# Patient Record
Sex: Male | Born: 1935
Health system: Southern US, Community
[De-identification: ages and names within clinical notes are randomized; demographics above are authoritative.]

## PROBLEM LIST (undated history)

## (undated) DIAGNOSIS — K219 Gastro-esophageal reflux disease without esophagitis: Secondary | ICD-10-CM

## (undated) DIAGNOSIS — Z860101 Personal history of adenomatous and serrated colon polyps: Secondary | ICD-10-CM

## (undated) DIAGNOSIS — Z86010 Personal history of colonic polyps: Secondary | ICD-10-CM

## (undated) DIAGNOSIS — Z8719 Personal history of other diseases of the digestive system: Secondary | ICD-10-CM

## (undated) DIAGNOSIS — E785 Hyperlipidemia, unspecified: Secondary | ICD-10-CM

## (undated) DIAGNOSIS — Z972 Presence of dental prosthetic device (complete) (partial): Secondary | ICD-10-CM

## (undated) DIAGNOSIS — R32 Unspecified urinary incontinence: Secondary | ICD-10-CM

## (undated) DIAGNOSIS — I10 Essential (primary) hypertension: Secondary | ICD-10-CM

## (undated) DIAGNOSIS — T4145XA Adverse effect of unspecified anesthetic, initial encounter: Secondary | ICD-10-CM

## (undated) DIAGNOSIS — J4530 Mild persistent asthma, uncomplicated: Secondary | ICD-10-CM

## (undated) DIAGNOSIS — Z973 Presence of spectacles and contact lenses: Secondary | ICD-10-CM

## (undated) DIAGNOSIS — E039 Hypothyroidism, unspecified: Secondary | ICD-10-CM

## (undated) DIAGNOSIS — M1990 Unspecified osteoarthritis, unspecified site: Secondary | ICD-10-CM

## (undated) DIAGNOSIS — Z86018 Personal history of other benign neoplasm: Secondary | ICD-10-CM

## (undated) DIAGNOSIS — N320 Bladder-neck obstruction: Secondary | ICD-10-CM

## (undated) DIAGNOSIS — G609 Hereditary and idiopathic neuropathy, unspecified: Secondary | ICD-10-CM

## (undated) DIAGNOSIS — R399 Unspecified symptoms and signs involving the genitourinary system: Secondary | ICD-10-CM

## (undated) DIAGNOSIS — N400 Enlarged prostate without lower urinary tract symptoms: Secondary | ICD-10-CM

## (undated) HISTORY — DX: Benign prostatic hyperplasia without lower urinary tract symptoms: N40.0

## (undated) HISTORY — PX: TONSILLECTOMY: SUR1361

## (undated) HISTORY — DX: Essential (primary) hypertension: I10

## (undated) HISTORY — DX: Gilbert syndrome: E80.4

## (undated) HISTORY — PX: TARSAL TUNNEL RELEASE: SUR1099

## (undated) SURGERY — Surgical Case
Anesthesia: *Unknown

---

## 1990-06-18 DIAGNOSIS — T8859XA Other complications of anesthesia, initial encounter: Secondary | ICD-10-CM

## 1990-06-18 HISTORY — DX: Other complications of anesthesia, initial encounter: T88.59XA

## 1990-06-18 HISTORY — PX: GASTRIC RESECTION: SHX5248

## 1997-06-18 HISTORY — PX: TURP VAPORIZATION: SUR1397

## 1997-11-01 ENCOUNTER — Ambulatory Visit (HOSPITAL_COMMUNITY): Admission: RE | Admit: 1997-11-01 | Discharge: 1997-11-01 | Payer: Self-pay | Admitting: Gastroenterology

## 1998-06-18 HISTORY — PX: INGUINAL HERNIA REPAIR: SUR1180

## 1998-06-21 ENCOUNTER — Ambulatory Visit (HOSPITAL_BASED_OUTPATIENT_CLINIC_OR_DEPARTMENT_OTHER): Admission: RE | Admit: 1998-06-21 | Discharge: 1998-06-21 | Payer: Self-pay | Admitting: Surgery

## 2000-08-26 ENCOUNTER — Encounter: Admission: RE | Admit: 2000-08-26 | Discharge: 2000-08-26 | Payer: Self-pay | Admitting: Geriatric Medicine

## 2000-08-26 ENCOUNTER — Encounter: Payer: Self-pay | Admitting: Gastroenterology

## 2003-03-18 ENCOUNTER — Encounter: Payer: Self-pay | Admitting: Urology

## 2003-03-25 ENCOUNTER — Observation Stay (HOSPITAL_COMMUNITY): Admission: RE | Admit: 2003-03-25 | Discharge: 2003-03-26 | Payer: Self-pay | Admitting: Urology

## 2003-03-25 ENCOUNTER — Encounter (INDEPENDENT_AMBULATORY_CARE_PROVIDER_SITE_OTHER): Payer: Self-pay | Admitting: Specialist

## 2003-03-25 HISTORY — PX: TRANSURETHRAL RESECTION OF PROSTATE: SHX73

## 2003-06-02 ENCOUNTER — Encounter (INDEPENDENT_AMBULATORY_CARE_PROVIDER_SITE_OTHER): Payer: Self-pay | Admitting: *Deleted

## 2003-06-02 ENCOUNTER — Ambulatory Visit (HOSPITAL_COMMUNITY): Admission: RE | Admit: 2003-06-02 | Discharge: 2003-06-02 | Payer: Self-pay | Admitting: Gastroenterology

## 2005-03-22 ENCOUNTER — Encounter: Admission: RE | Admit: 2005-03-22 | Discharge: 2005-03-22 | Payer: Self-pay | Admitting: Urology

## 2005-03-23 ENCOUNTER — Encounter (INDEPENDENT_AMBULATORY_CARE_PROVIDER_SITE_OTHER): Payer: Self-pay | Admitting: Specialist

## 2005-03-23 ENCOUNTER — Ambulatory Visit (HOSPITAL_BASED_OUTPATIENT_CLINIC_OR_DEPARTMENT_OTHER): Admission: RE | Admit: 2005-03-23 | Discharge: 2005-03-23 | Payer: Self-pay | Admitting: Urology

## 2005-03-23 ENCOUNTER — Ambulatory Visit (HOSPITAL_COMMUNITY): Admission: RE | Admit: 2005-03-23 | Discharge: 2005-03-23 | Payer: Self-pay | Admitting: Urology

## 2005-03-23 HISTORY — PX: OTHER SURGICAL HISTORY: SHX169

## 2008-08-16 ENCOUNTER — Encounter: Admission: RE | Admit: 2008-08-16 | Discharge: 2008-08-16 | Payer: Self-pay | Admitting: Allergy

## 2009-04-29 ENCOUNTER — Encounter: Admission: RE | Admit: 2009-04-29 | Discharge: 2009-04-29 | Payer: Self-pay | Admitting: Family Medicine

## 2009-09-23 ENCOUNTER — Encounter: Admission: RE | Admit: 2009-09-23 | Discharge: 2009-09-23 | Payer: Self-pay | Admitting: Podiatry

## 2009-11-23 ENCOUNTER — Encounter: Admission: RE | Admit: 2009-11-23 | Discharge: 2009-12-14 | Payer: Self-pay | Admitting: Orthopedic Surgery

## 2010-05-17 ENCOUNTER — Encounter: Admission: RE | Admit: 2010-05-17 | Discharge: 2010-05-17 | Payer: Self-pay | Admitting: Family Medicine

## 2010-06-07 ENCOUNTER — Encounter
Admission: RE | Admit: 2010-06-07 | Discharge: 2010-06-07 | Payer: Self-pay | Source: Home / Self Care | Attending: Family Medicine | Admitting: Family Medicine

## 2010-06-18 HISTORY — PX: REPAIR SUBLUXING TENDON WITH GASTROC SLIDE: SHX5672

## 2010-11-03 NOTE — Op Note (Signed)
NAME:  Brent Yu, Brent Yu                          ACCOUNT NO.:  [id number]   MEDICAL RECORD NO.:  [id number]                   PATIENT TYPE:  AMB   LOCATION:  ENDO                                 FACILITY:  [facility]   PHYSICIAN:  Bernette Redbird, M.D.                DATE OF BIRTH:  Sep 02, 1932   DATE OF PROCEDURE:  06/02/2003  DATE OF DISCHARGE:                                 OPERATIVE REPORT   PROCEDURE PERFORMED:  Upper endoscopy with biopsies.   ENDOSCOPIST:  Florencia Reasons, M.D.   INDICATIONS FOR PROCEDURE:  The patient is a 77 year old gentleman with  prior history of gastric leiomyoma resection and ongoing problem with daily  transient hiccups.   FINDINGS:  Distal grade 1 esophagitis consistent with reflux.   DESCRIPTION OF PROCEDURE:  The nature, purpose and risks of the procedure  were familiar to the patient from prior examination and he provided written  consent.  Sedation was fentanyl 50 mcg and Versed 4 mg IV without  arrhythmias or desaturation.  The Olympus video endoscope was passed under  direct vision.   There esophagus was entered without difficulty and was pertinent for linear  faint erythema in the distal esophageal region, suggestive of grade one  reflux esophagitis, but without any erosive changes and without evidence of  Barrett's esophagus.  There was no evidence of free reflux or any ring,  stricture or significant  hiatal hernia.  No varices, infection or neoplasia  were seen.   The stomach contained no significant residual.  There were a couple of black  spots on the proximal gastric wall suggestive of stitch remnants from the  patient's previous leiomyoma resection.  There was no significant anatomic  deformity of the stomach.  No gastritis, erosions, ulcers, polyps or masses  were observed and a retroflex view of the proximal stomach was unremarkable.  The pylorus, duodenal bulb and second duodenum looked normal.   After obtaining biopsies from the  distal esophagus, the scope was removed  from the patient, who tolerated the procedure well and without apparent  complications.   IMPRESSION:  1. Grade 1 reflux esophagitis (530.11).  2. Prior history of gastric leiomyoma, status post resection without     evidence of intraluminal recurrence.   PLAN:  Trial of twice daily proton pump inhibitor therapy.                                               Bernette Redbird, M.D.    RB/MEDQ  D:  06/02/2003  T:  06/02/2003  Job:  [id number]   cc:   Al Decant. Janey Greaser, MD  [address]  [place]  [place] [address]  Fax: [phone]

## 2010-11-03 NOTE — Op Note (Signed)
NAMETREYLON, HENARD                ACCOUNT NO.:  [id number]   MEDICAL RECORD NO.:  [id number]          PATIENT TYPE:  AMB   LOCATION:  [facility]C                         FACILITY:  [facility]   PHYSICIAN:  Sigmund I. Patsi Sears, M.D.DATE OF BIRTH:  [birth year]   DATE OF PROCEDURE:  03/23/2005  DATE OF DISCHARGE:                                 OPERATIVE REPORT   PREOPERATIVE DIAGNOSIS:  Microhematuria with positive NMP-22.   POSTOPERATIVE DIAGNOSIS:  Microhematuria with positive NMP-22.   OPERATION:  Cystourethroscopy, bilateral retrograde pyelogram with  interpretation, and bladder biopsies with cauterization of biopsy sites.   SURGEON:  Sigmund I. Patsi Sears, M.D.   ANESTHESIA:  General LMA.   PREPARATION:  After appropriate preanesthesia, the patient is brought to the  operating room, placed on the operating table in dorsal supine position  where general LMA anesthesia was introduced. He was then replaced in dorsal  lithotomy position where the pubis was prepped with Betadine solution and  draped in the usual fashion.   DESCRIPTION OF PROCEDURE:  Cystourethroscopy was accomplished, which showed  a normal-appearing prostate fossa with a normal bladder neck. The bladder  appeared smooth, and regular, without evidence of bladder stone, tumor, or  diverticular formation.   Bilateral retrograde pyelogram was performed, with the finding of normal  appearing ureter. The ureters had normal contour, normal caliber. I did not  see any evidence of bladder stone, tumor, or diverticular formation.   Following bilateral retrograde pyelograms, bilateral bladder biopsies were  accomplished, each of the biopsy sites were cauterized. Clear bladder fluid  was identified, and the bladder drained of fluid, Xylocaine jelly placed in  the urethra. The patient was given 15 mg of IV Toradol, awakened and taken  to recovery room in good condition.      Sigmund I. Patsi Sears, M.D.  Electronically  Signed     SIT/MEDQ  D:  03/23/2005  T:  03/23/2005  Job:  [id number]

## 2010-11-03 NOTE — Op Note (Signed)
   NAME:  Brent Yu, Brent Yu                          ACCOUNT NO.:  [id number]   MEDICAL RECORD NO.:  [id number]                   PATIENT TYPE:  OBV   LOCATION:  0347                                 FACILITY:  [facility]   PHYSICIAN:  Sigmund I. Patsi Sears, M.D.         DATE OF BIRTH:  08-Jul-1932   DATE OF PROCEDURE:  03/25/2003  DATE OF DISCHARGE:                                 OPERATIVE REPORT   PREOPERATIVE DIAGNOSIS:  Benign prostatic hypertrophy.   POSTOPERATIVE DIAGNOSIS:  Benign prostatic hypertrophy.   PROCEDURE:  Transurethral resection of the prostate.   SURGEON:  Sigmund I. Patsi Sears, M.D.   ANESTHESIA:  Spinal.   PREOPERATIVE PREPARATION:  After appropriate preanesthesia, the patient was  brought to the operating room and placed on the operating table in the  dorsal supine position.  He was then replaced in the right lateral decubitus  position, where a spinal anesthetic was introduced.  He was then replaced in  a dorsal lithotomy position, where the pubis was prepped with Betadine  solution and draped in the usual fashion.   DESCRIPTION OF PROCEDURE:  Cystourethroscopy revealed a tight bladder neck  with regrowth of the prostate, and TURP was accomplished from the 7 o'clock  position to the 5 o'clock positions, and from the 11 o'clock to the 7  o'clock position.  Bleeding was controlled with the electrosurgical unit.  Chips were evacuated from the bladder and a two-way size 24 Ainsworth  catheter was placed to traction.  The patient was taken to the recovery room  in good condition.                                                Sigmund I. Patsi Sears, M.D.    SIT/MEDQ  D:  03/25/2003  T:  03/25/2003  Job:  [id number]

## 2010-11-03 NOTE — Op Note (Signed)
NAME:  Brent Yu, Brent Yu                          ACCOUNT NO.:  [id number]   MEDICAL RECORD NO.:  [id number]                   PATIENT TYPE:  AMB   LOCATION:  ENDO                                 FACILITY:  [facility]   PHYSICIAN:  Bernette Redbird, M.D.                DATE OF BIRTH:  09-Nov-1932   DATE OF PROCEDURE:  06/02/2003  DATE OF DISCHARGE:                                 OPERATIVE REPORT   PROCEDURE PERFORMED:  Colonoscopy.   ENDOSCOPIST:  Florencia Reasons, M.D.   INDICATIONS FOR PROCEDURE:  Family history of colon cancer in a 77 year old  gentleman whose last screening colonoscopy was five and a half years ago.   FINDINGS:  Normal except for some left-sided diverticulosis.   DESCRIPTION OF PROCEDURE:  The nature, purpose and risks of the procedure  were familiar to the patient from prior examination and he provided written  consent.  Sedation for this procedure and the endoscopy which preceded it  totaled fentanyl 50 mcg and Versed 5 mg IV without arrhythmias or  desaturation.  Digital exam of the prostate was unremarkable.  The Olympus  adult video colonoscope was advanced without significant difficulty to the  cecum as identified by clear visualization of the appendiceal orifice and  pullback was then performed.  The quality of the prep in the colon was very  good and it is felt that all areas were well seen.   I believe there was some mild left-sided diverticulosis but no polyps,  cancer, colitis or vascular malformations were noted.  Retroflexion in the  rectum and inspection of the rectosigmoid was unremarkable.  No biopsies  were obtained.  The patient tolerated the procedure well and there were no  apparent complications.   IMPRESSION:  1. Family history of colon cancer (V16.0).  2. No evidence of polyps or significant abnormalities at this time.     Possible mild left-sided diverticulosis.   PLAN:  Follow-up colonoscopy in five years.                    Bernette Redbird, M.D.    RB/MEDQ  D:  06/02/2003  T:  06/02/2003  Job:  [id number]   cc:   Al Decant. Janey Greaser, MD  [address]  [place]  [place] [address]  Fax: [phone]

## 2012-09-29 ENCOUNTER — Telehealth: Payer: Self-pay | Admitting: Neurology

## 2012-09-29 NOTE — Telephone Encounter (Signed)
Patient called and stated that he was prescribed a compounded neuropathy cream at his visit on 09-01-12, and has tried it for 20 days and it's not working.  He would like doctor's advice on what else he can do.  He can be reached at [phone].

## 2012-10-02 NOTE — Telephone Encounter (Signed)
Patient has an appt on 10-08-12 for follow up.

## 2012-10-08 ENCOUNTER — Ambulatory Visit (INDEPENDENT_AMBULATORY_CARE_PROVIDER_SITE_OTHER): Payer: Medicare Other | Admitting: Diagnostic Neuroimaging

## 2012-10-08 ENCOUNTER — Encounter: Payer: Self-pay | Admitting: Diagnostic Neuroimaging

## 2012-10-08 VITALS — BP 146/76 | HR 68 | Temp 97.6°F | Ht 70.0 in | Wt 163.0 lb

## 2012-10-08 DIAGNOSIS — G589 Mononeuropathy, unspecified: Secondary | ICD-10-CM

## 2012-10-08 DIAGNOSIS — G629 Polyneuropathy, unspecified: Secondary | ICD-10-CM | POA: Insufficient documentation

## 2012-10-08 MED ORDER — GABAPENTIN 300 MG PO CAPS: 300.0000 mg | ORAL_CAPSULE | Freq: Three times a day (TID) | ORAL | Status: DC

## 2012-10-08 NOTE — Progress Notes (Signed)
[facility]  PATIENT: Brent Yu DOB: 12/13/1932  REFERRING CLINICIAN:  HISTORY FROM: patient and wife REASON FOR VISIT: urgent follow up   HISTORICAL  CHIEF COMPLAINT:  Chief Complaint  Patient presents with  . Pain    foot    HISTORY OF PRESENT ILLNESS:   UPDATE 10/08/12: Since last visit, tried compounded neuropathy cream for 20 days without relief. Patient's symptoms are persistent. Still with burning aching pain in the bottom of his feet. No back pain.  PRIOR HPI: 79 year old male with history of hypertension, hypercholesterolemia, here for evaluation of pain and numbness in his feet.  In 2005 patient developed gradual onset pain in the bilateral heel regions. Gradually this progressed anteriorly. He was diagnosed with tarsal tunnel syndrome. He was treated with a number of surgeries including right gastrocnemius slide surgery in 2012, followed by bilateral tarsal tunnel syndrome decompression surgeries in March 2013. Patient has had minimal relief from these procedures.  Patient continues to have aching and burning pain in the anterior balls of his feet. He denies any low back pain radiating to the lower extremities. He has tried Voltaren gel without relief. She's tried foot orthotics without relief. Walking or standing for a long time seems to aggravate his symptoms.  REVIEW OF SYSTEMS: Full 14 system review of systems performed and notable only for ringing in the years, allergies.  ALLERGIES: Not on File  HOME MEDICATIONS: No outpatient prescriptions prior to visit.   No facility-administered medications prior to visit.    PAST MEDICAL HISTORY: Past Medical History  Diagnosis Date  . Hypertension   . High cholesterol   . Benign prostatic hyperplasia   . Rhinitis   . Intractable hiccups   . Asthma   . Esophagitis   . Gilbert's syndrome     PAST SURGICAL HISTORY: Past Surgical History  Procedure Laterality Date  . Repair subluxing  tendon with gastroc slide    . Transurethral resection of prostate    . Inguinal hernia repair    . Gastric tumor    . Tonsillectomy    . Topaz      FAMILY HISTORY: Family History  Problem Relation Age of Onset  . Cancer Mother   . Stroke Father     SOCIAL HISTORY:  History   Social History  . Marital Status: Married    Spouse Name: N/A    Number of Children: 2  . Years of Education: BS   Occupational History  . Not on file.   Social History Main Topics  . Smoking status: Former Smoker    Quit date: 06/18/1962  . Smokeless tobacco: Not on file  . Alcohol Use: No  . Drug Use: No  . Sexually Active: Not on file   Other Topics Concern  . Not on file   Social History Narrative   Pt lives at home with his spouse.   Caffeine Use- Does not consume     PHYSICAL EXAM  Filed Vitals:   10/08/12 1404  BP: 146/76  Pulse: 68  Temp: 97.6 F (36.4 C)  TempSrc: Oral  Height: 5\' 10"  (1.778 m)  Weight: 163 lb (73.936 kg)   Body mass index is 23.39 kg/(m^2).  EXAM: General: Patient is awake, alert and in no acute distress.  Well developed and groomed. Neck: Neck is supple. Cardiovascular: No carotid artery bruits.  Heart is regular rate and rhythm with no murmurs.  Neurologic Exam  Mental Status: Awake, alert. Language is fluent and comprehension  intact. Cranial Nerves: No evidence of papilledema on funduscopic exam.  Pupils are equal and reactive to light.  Visual fields are full to confrontation.  Conjugate eye movements are full and symmetric.  Facial sensation and strength are symmetric.  Hearing is intact.  Palate elevated symmetrically and uvula is midline.  Shoulder shrug is symmetric.  Tongue is midline. Motor: Normal bulk and tone.  Full strength in the upper and lower extremities.  No pronator drift. Sensory: Intact and symmetric to light touch Coordination: No ataxia or dysmetria on finger-nose or rapid alternating movement testing. Gait and Station:  Narrow based gait. PAINFUL TO walk on heels and toes. Romberg is negative. Reflexes: BUE 2, KNEES 1, ANKLES 0. DOWN GOING TOES.   DIAGNOSTIC DATA (LABS, IMAGING, TESTING) - I reviewed patient records, labs, notes, testing and imaging myself where available.  No results found for this basename: WBC, HGB, HCT, MCV, PLT   No results found for this basename: na, k, cl, co2, glucose, bun, creatinine, calcium, prot, albumin, ast, alt, alkphos, bilitot, gfrnonaa, gfraa   No results found for this basename: CHOL, HDL, LDLCALC, LDLDIRECT, TRIG, CHOLHDL   No results found for this basename: HGBA1C   No results found for this basename: VITAMINB12   No results found for this basename: TSH   TSH 5.410 (h) B12 1378 A1C 5.8   ASSESSMENT AND PLAN  79 y.o. year old male  has a past medical history of Hypertension; High cholesterol; Benign prostatic hyperplasia; Rhinitis; Intractable hiccups; Asthma; Esophagitis; and Gilbert's syndrome. here with PAIN IN THE FEET.  Ddx: neuropathy (metabolic, toxic, idiopathic) vs. bilateral S1 radiculopathies  PLAN: 1. Try gabapentin 300mg  qhs --> TID 2. may consider MRI lumbar spine in future vs. repeat EMG/NCS   Suanne Marker, MD 10/08/2012, 2:39 PM Certified in Neurology, Neurophysiology and Neuroimaging  [facility] [address] [place], [place] [address] [phone]

## 2012-10-08 NOTE — Patient Instructions (Signed)
Try gabapentin 300 mg at bedtime. Every week he may increase by 1 tablet per day up to goal of 1 tablet 3 times per day. Keep a journal of pain level on a daily basis.

## 2012-11-17 ENCOUNTER — Telehealth: Payer: Self-pay | Admitting: Diagnostic Neuroimaging

## 2012-11-18 NOTE — Telephone Encounter (Signed)
Patient is requesting dose increase on Gabapentin.  Will forward request to provider for review.

## 2012-12-05 ENCOUNTER — Telehealth: Payer: Self-pay | Admitting: Nurse Practitioner

## 2012-12-05 MED ORDER — GABAPENTIN 300 MG PO CAPS: 600.0000 mg | ORAL_CAPSULE | Freq: Three times a day (TID) | ORAL | Status: DC

## 2012-12-05 NOTE — Telephone Encounter (Signed)
Spoke to Brent Yu about increasing his dose of Gabapentin.  He had called requesting an increase.  He reports that he  is getting some relief.  Increased to 2 300mg  capsules each night, then titrate up every 2-3 days adding a capsule at other times.  Final dose 600mg  TID.  Advised that if he becomes too sleepy or dizzy during the day with the increased dose to reduce dose back to a tolerable level.

## 2013-01-02 ENCOUNTER — Ambulatory Visit: Payer: Self-pay | Admitting: Nurse Practitioner

## 2013-02-17 ENCOUNTER — Ambulatory Visit: Payer: Self-pay | Admitting: Nurse Practitioner

## 2013-03-26 ENCOUNTER — Encounter: Payer: Self-pay | Admitting: Nurse Practitioner

## 2013-03-26 ENCOUNTER — Ambulatory Visit
Admission: RE | Admit: 2013-03-26 | Discharge: 2013-03-26 | Disposition: A | Discharge status: Home / Self Care | Payer: Medicare Other | Source: Ambulatory Visit | Attending: Nurse Practitioner | Admitting: Nurse Practitioner

## 2013-03-26 ENCOUNTER — Ambulatory Visit
Admission: RE | Admit: 2013-03-26 | Discharge: 2013-03-26 | Disposition: A | Discharge status: Home / Self Care | Payer: Self-pay | Source: Ambulatory Visit | Attending: Nurse Practitioner | Admitting: Nurse Practitioner

## 2013-03-26 ENCOUNTER — Ambulatory Visit (INDEPENDENT_AMBULATORY_CARE_PROVIDER_SITE_OTHER): Payer: Medicare Other | Admitting: Nurse Practitioner

## 2013-03-26 VITALS — BP 125/65 | HR 63 | Temp 97.8°F | Ht 71.0 in | Wt 163.0 lb

## 2013-03-26 DIAGNOSIS — M25559 Pain in unspecified hip: Secondary | ICD-10-CM

## 2013-03-26 DIAGNOSIS — G629 Polyneuropathy, unspecified: Secondary | ICD-10-CM

## 2013-03-26 DIAGNOSIS — M25551 Pain in right hip: Secondary | ICD-10-CM

## 2013-03-26 DIAGNOSIS — Z9181 History of falling: Secondary | ICD-10-CM

## 2013-03-26 DIAGNOSIS — G589 Mononeuropathy, unspecified: Secondary | ICD-10-CM

## 2013-03-26 DIAGNOSIS — E039 Hypothyroidism, unspecified: Secondary | ICD-10-CM

## 2013-03-26 NOTE — Progress Notes (Signed)
I have read the note, and I agree with the clinical assessment and plan.  WILLIS,CHARLES KEITH   

## 2013-03-26 NOTE — Progress Notes (Signed)
[facility]  PATIENT: Brent Yu DOB: 03-04-1933   REASON FOR VISIT: follow up HISTORY FROM: patient  HISTORY OF PRESENT ILLNESS: UPDATE 03/26/13 (LL):  Brent Yu comes back for revisit.  Since last visit, he slowly increased the Gabapentin to 600 mg 3 times a day, which he states made his unbalanced.  He fell approximately 3 weeks ago on his right hip but di not seek medical attention right away.  The next few days he had increasing pain in the hip and went to his PCP who scheduled him with orthopedics.  When it came time for the appointment, he was feeling better, so he cancelled the appointment.  Soon after cancelling the appointment, he started having pain again in the right hip with pain radiating down the back of the right thigh.  He now takes his Gabapentin 2 caps at 10 am, 1 cap at 5 pm, and 2 caps at 11 pm.  He states it helps the pain, was 8/10 on initial visit, is now averaging 6/10, with burning aching pain in the bottom of his feet. He recently started on thyroid medication for hypothyroidism.  UPDATE 10/08/12: Since last visit, tried compounded neuropathy cream for 20 days without relief. Patient's symptoms are persistent. Still with burning aching pain in the bottom of his feet. No back pain.   PRIOR HPI: 79 year old male with history of hypertension, hypercholesterolemia, here for evaluation of pain and numbness in his feet.  In 2005 patient developed gradual onset pain in the bilateral heel regions. Gradually this progressed anteriorly. He was diagnosed with tarsal tunnel syndrome. He was treated with a number of surgeries including right gastrocnemius slide surgery in 2012, followed by bilateral tarsal tunnel syndrome decompression surgeries in March 2013. Patient has had minimal relief from these procedures.  Patient continues to have aching and burning pain in the anterior balls of his feet. He denies any low back pain radiating to the lower extremities.  He has tried Voltaren gel without relief. he's tried foot orthotics without relief. Walking or standing for a long time seems to aggravate his symptoms.   REVIEW OF SYSTEMS: Full 14 system review of systems performed and notable only for ringing in the ears, allergies, aching muscles..   ALLERGIES: No Known Allergies  HOME MEDICATIONS: Outpatient Prescriptions Prior to Visit  Medication Sig Dispense Refill  . amLODipine (NORVASC) 5 MG tablet Take 5 mg by mouth daily.      Marland Kitchen atorvastatin (LIPITOR) 40 MG tablet Take 40 mg by mouth daily.      . cetirizine (ZYRTEC) 10 MG chewable tablet Chew 10 mg by mouth daily.      . fish oil-omega-3 fatty acids 1000 MG capsule Take 2 g by mouth daily.      . fluticasone (VERAMYST) 27.5 MCG/SPRAY nasal spray Place 2 sprays into the nose daily.      . Fluticasone-Salmeterol (ADVAIR) 100-50 MCG/DOSE AEPB Inhale 1 puff into the lungs every 12 (twelve) hours.      . gabapentin (NEURONTIN) 300 MG capsule Take 2 capsules (600 mg total) by mouth 3 (three) times daily.  180 capsule  11  . Multiple Vitamins-Minerals (CENTRUM SILVER PO) Take 1 tablet by mouth daily.      Marland Kitchen omeprazole (PRILOSEC) 10 MG capsule Take 10 mg by mouth daily.      . Probiotic Product (PROBIOTIC DAILY PO) Take 1 tablet by mouth daily.      . ranitidine (ZANTAC) 75 MG tablet Take 75 mg  by mouth 2 (two) times daily.      . sildenafil (VIAGRA) 100 MG tablet Take 5 mg by mouth as needed for erectile dysfunction.      Marland Kitchen telmisartan-hydrochlorothiazide (MICARDIS HCT) 80-12.5 MG per tablet Take 1 tablet by mouth daily.       No facility-administered medications prior to visit.    PAST MEDICAL HISTORY: Past Medical History  Diagnosis Date  . Hypertension   . High cholesterol   . Benign prostatic hyperplasia   . Rhinitis   . Intractable hiccups   . Asthma   . Esophagitis   . Gilbert's syndrome     PAST SURGICAL HISTORY: Past Surgical History  Procedure Laterality Date  . Repair  subluxing tendon with gastroc slide    . Transurethral resection of prostate    . Inguinal hernia repair    . Gastric tumor    . Tonsillectomy    . Topaz      FAMILY HISTORY: Family History  Problem Relation Age of Onset  . Cancer Mother   . Stroke Father     SOCIAL HISTORY: History   Social History  . Marital Status: Married    Spouse Name: susie    Number of Children: 2  . Years of Education: BS   Occupational History  . 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 Smoking status: Former Smoker    Quit date: 06/18/1962  . Smokeless tobacco: Not on file  . Alcohol Use: No  . Drug Use: No  . Sexual Activity: Not on file   Other Topics Concern  . Not on file   Social History Narrative   Pt lives at home with his spouse.   Caffeine Use- Does not consume     PHYSICAL EXAM  Filed Vitals:   03/26/13 0846  BP: 125/65  Pulse: 63  Temp: 97.8 F (36.6 C)  TempSrc: Oral  Height: 5\' 11"  (1.803 m)  Weight: 163 lb (73.936 kg)   Body mass index is 22.74 kg/(m^2).  EXAM:  General: Patient is awake, alert and in no acute distress. Well developed and groomed.  Neck: Neck is supple.  Cardiovascular: No carotid artery bruits. Heart is regular rate and rhythm with no murmurs.   Neurologic Exam  Mental Status: Awake, alert. Language is fluent and comprehension intact.  Cranial Nerves: No evidence of papilledema on funduscopic exam. Pupils are equal and reactive to light. Visual fields are full to confrontation. Conjugate eye movements are full and symmetric. Facial sensation and strength are symmetric. Hearing is intact. Palate elevated symmetrically and uvula is midline. Shoulder shrug is symmetric. Tongue is midline.  Motor: Normal bulk and tone. Full strength in the upper and lower extremities. No pronator drift.  Sensory: Intact and symmetric to light touch  Coordination: No ataxia or dysmetria on finger-nose or rapid alternating movement  testing.  Gait and Station: Narrow based gait. PAINFUL TO walk on heels and toes. Romberg is negative.  Reflexes: BUE 2, KNEES 1, ANKLES 0. DOWN GOING TOES.   DIAGNOSTIC DATA (LABS, IMAGING, TESTING) - I reviewed patient records, labs, notes, testing and imaging myself where available.  TSH 5.410 (h)  B12 1378  A1C 5.8   ASSESSMENT AND PLAN 79 y.o. year old male has a past medical history of Hypertension; High cholesterol; Benign prostatic hyperplasia; Rhinitis; Intractable hiccups; Asthma; Esophagitis; and Gilbert's syndrome here with PAIN IN THE FEET.   Ddx: neuropathy (metabolic, toxic, idiopathic) vs. bilateral S1 radiculopathies  PLAN:  1. Continue gabapentin 600 mg TID as tolerated 2. Right diagnostic complete hip xray today due to recent fall and ongoing pain. 3. MRI lumbar spine without contrast to eval for S1 radiculopathies. 4. May repeat EMG/NCS in future. 5. Follow up in 3 months.  Orders Placed This Encounter  Procedures  . MR Lumbar Spine Wo Contrast  . DG Hip Complete Right   Tawny Asal Shavelle Runkel, MSN, NP-C 03/26/2013, 9:48 AM [facility] [address] [place], [place] [address] [phone]

## 2013-03-27 NOTE — Progress Notes (Signed)
Quick Note:  Call pt about X-Ray, talk with pt's wife susie, she verbalized understanding. ______

## 2013-04-06 ENCOUNTER — Telehealth: Payer: Self-pay | Admitting: Nurse Practitioner

## 2013-04-06 NOTE — Progress Notes (Signed)
I reviewed note and agree with plan.   Suanne Marker, MD 04/06/2013, 11:20 PM Certified in Neurology, Neurophysiology and Neuroimaging  [facility] [address], [address] [place], [place] [address] [phone]

## 2013-04-07 ENCOUNTER — Ambulatory Visit
Admission: RE | Admit: 2013-04-07 | Discharge: 2013-04-07 | Disposition: A | Discharge status: Home / Self Care | Payer: Medicare Other | Source: Ambulatory Visit | Attending: Nurse Practitioner | Admitting: Nurse Practitioner

## 2013-04-07 DIAGNOSIS — Z9181 History of falling: Secondary | ICD-10-CM

## 2013-04-07 DIAGNOSIS — G629 Polyneuropathy, unspecified: Secondary | ICD-10-CM

## 2013-04-07 DIAGNOSIS — G589 Mononeuropathy, unspecified: Secondary | ICD-10-CM

## 2013-04-09 NOTE — Telephone Encounter (Signed)
Message copied by Stephanie Acre on Thu Apr 09, 2013  4:16 PM ------      Message from: LAM, Larita Fife E      Created: Thu Apr 09, 2013  4:06 PM                   ----- Message -----         From: Suanne Marker, MD         Sent: 04/09/2013  12:02 PM           To: Ronal Fear, NP             ------

## 2013-04-09 NOTE — Telephone Encounter (Signed)
I called patient. The MRI study of the lumbar spine shows neuroforaminal stenosis at the L4-5 level primarily to the left. On the clinical note, the patient was mainly having right leg pain. I would wonder whether or not an epidural steroid injection might help. This patient primarily is followed through Dr. Marjory Lies.

## 2013-04-23 ENCOUNTER — Other Ambulatory Visit: Payer: Self-pay

## 2013-05-01 ENCOUNTER — Telehealth: Payer: Self-pay | Admitting: *Deleted

## 2013-05-01 NOTE — Telephone Encounter (Signed)
I received call from [place], Charity fundraiser for [employer] at [facility], about request for hip xray.  I faxed to [phone], [phone].

## 2013-07-01 ENCOUNTER — Encounter: Payer: Self-pay | Admitting: Nurse Practitioner

## 2013-07-01 ENCOUNTER — Ambulatory Visit (INDEPENDENT_AMBULATORY_CARE_PROVIDER_SITE_OTHER): Payer: Medicare Other | Admitting: Nurse Practitioner

## 2013-07-01 VITALS — BP 130/72 | HR 66 | Ht 70.0 in | Wt 163.0 lb

## 2013-07-01 DIAGNOSIS — G589 Mononeuropathy, unspecified: Secondary | ICD-10-CM

## 2013-07-01 DIAGNOSIS — G629 Polyneuropathy, unspecified: Secondary | ICD-10-CM

## 2013-07-01 NOTE — Progress Notes (Signed)
PATIENT: Brent Yu DOB: 15-Nov-1932   REASON FOR VISIT: follow up for neuropathy HISTORY FROM: patient  HISTORY OF PRESENT ILLNESS: UPDATE 07/01/13 (LL):  Brent Yu comes back for revisit.  He continues with Gabapentin 2 caps at 10 am, 1 cap at 5 pm, and 2 caps at 11 pm.  He states it helps the pain, was 8/10 on initial visit, is now averaging 4-5/10, with burning aching pain in the bottom of his feet after standing.  The feet do not hurt at night.  His back and hip issues are resolved.  No new complaints.  UPDATE 03/26/13 (LL): Brent Yu comes back for revisit. Since last visit, he slowly increased the Gabapentin to 600 mg 3 times a day, which he states made his unbalanced. He fell approximately 3 weeks ago on his right hip but di not seek medical attention right away. The next few days he had increasing pain in the hip and went to his PCP who scheduled him with orthopedics. When it came time for the appointment, he was feeling better, so he cancelled the appointment. Soon after cancelling the appointment, he started having pain again in the right hip with pain radiating down the back of the right thigh. He now takes his Gabapentin 2 caps at 10 am, 1 cap at 5 pm, and 2 caps at 11 pm. He states it helps the pain, was 8/10 on initial visit, is now averaging 6/10, with burning aching pain in the bottom of his feet. He recently started on thyroid medication for hypothyroidism.   UPDATE 10/08/12: Since last visit, tried compounded neuropathy cream for 20 days without relief. Patient's symptoms are persistent. Still with burning aching pain in the bottom of his feet. No back pain.   PRIOR HPI: 80 year old male with history of hypertension, hypercholesterolemia, here for evaluation of pain and numbness in his feet.  In 2005 patient developed gradual onset pain in the bilateral heel regions. Gradually this progressed anteriorly. He was diagnosed with tarsal tunnel syndrome. He was treated with a  number of surgeries including right gastrocnemius slide surgery in 2012, followed by bilateral tarsal tunnel syndrome decompression surgeries in March 2013. Patient has had minimal relief from these procedures.  Patient continues to have aching and burning pain in the anterior balls of his feet. He denies any low back pain radiating to the lower extremities. He has tried Voltaren gel without relief. he's tried foot orthotics without relief. Walking or standing for a long time seems to aggravate his symptoms.   REVIEW OF SYSTEMS: Full 14 system review of systems performed and notable only for ringing in the ears, allergies, cough.   ALLERGIES: No Known Allergies  HOME MEDICATIONS: Outpatient Prescriptions Prior to Visit  Medication Sig Dispense Refill  . atorvastatin (LIPITOR) 40 MG tablet Take 40 mg by mouth daily.      . cetirizine (ZYRTEC) 10 MG chewable tablet Chew 10 mg by mouth daily.      . fish oil-omega-3 fatty acids 1000 MG capsule Take 2 g by mouth daily.      . Fluticasone-Salmeterol (ADVAIR) 100-50 MCG/DOSE AEPB Inhale 1 puff into the lungs 2 (two) times daily.       Marland Kitchen gabapentin (NEURONTIN) 300 MG capsule Take 2 capsules (600 mg total) by mouth 3 (three) times daily.  180 capsule  11  . levothyroxine (SYNTHROID, LEVOTHROID) 25 MCG tablet Take 25 mcg by mouth daily before breakfast.      . Multiple Vitamins-Minerals (CENTRUM  SILVER PO) Take 1 tablet by mouth daily.      Marland Kitchen omeprazole (PRILOSEC) 10 MG capsule Take 10 mg by mouth daily.      . ranitidine (ZANTAC) 75 MG tablet Take 75 mg by mouth 2 (two) times daily.      . sildenafil (VIAGRA) 100 MG tablet Take 5 mg by mouth as needed for erectile dysfunction.      Marland Kitchen telmisartan-hydrochlorothiazide (MICARDIS HCT) 80-12.5 MG per tablet Take 1 tablet by mouth daily.      Marland Kitchen amLODipine (NORVASC) 5 MG tablet Take 5 mg by mouth daily.      . fluticasone (VERAMYST) 27.5 MCG/SPRAY nasal spray Place 2 sprays into the nose daily.      .  Probiotic Product (PROBIOTIC DAILY PO) Take 1 tablet by mouth daily.       Marland Kitchen DISCONTD: fluticasone (FLONASE) 50 MCG/ACT nasal spray    Sig:   . EPIPEN 2-PAK 0.3 MG/0.3ML SOAJ injection    Sig:   . amLODipine (NORVASC) 5 MG tablet    Sig: Take 5 mg by mouth daily.    PAST MEDICAL HISTORY: Past Medical History  Diagnosis Date  . Hypertension   . High cholesterol   . Benign prostatic hyperplasia   . Rhinitis   . Intractable hiccups   . Asthma   . Esophagitis   . Gilbert's syndrome     PAST SURGICAL HISTORY: Past Surgical History  Procedure Laterality Date  . Repair subluxing tendon with gastroc slide    . Transurethral resection of prostate    . Inguinal hernia repair    . Gastric tumor    . Tonsillectomy    . Topaz      FAMILY HISTORY: Family History  Problem Relation Age of Onset  . Cancer Mother   . Stroke Father     SOCIAL HISTORY: History   Social History  . Marital Status: Married    Spouse Name: susie    Number of Children: 2  . Years of Education: BS   Occupational History  . 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 Smoking status: Former Smoker    Quit date: 06/18/1962  . Smokeless tobacco: Not on file  . Alcohol Use: No  . Drug Use: No  . Sexual Activity: Not on file   Other Topics Concern  . Not on file   Social History Narrative   Pt lives at home with his spouse.   Caffeine Use- Does not consume     PHYSICAL EXAM  Filed Vitals:   07/01/13 1046  BP: 130/72  Pulse: 66  Height: 5\' 10"  (1.778 m)  Weight: 163 lb (73.936 kg)   Body mass index is 23.39 kg/(m^2).  EXAM:  General: Patient is awake, alert and in no acute distress. Well developed and groomed.  Neck: Neck is supple.  Cardiovascular: No carotid artery bruits. Heart is regular rate and rhythm with no murmurs.  Neurologic Exam  Mental Status: Awake, alert. Language is fluent and comprehension intact.  Cranial Nerves: No evidence of  papilledema on funduscopic exam. Pupils are equal and reactive to light. Visual fields are full to confrontation. Conjugate eye movements are full and symmetric. Facial sensation and strength are symmetric. Hearing is intact. Palate elevated symmetrically and uvula is midline. Shoulder shrug is symmetric. Tongue is midline.  Motor: Normal bulk and tone. Full strength in the upper and lower extremities. No pronator drift.  Sensory: Intact and symmetric to light touch  Coordination: No ataxia or dysmetria on finger-nose or rapid alternating movement testing.  Gait and Station: Narrow based gait. PAINFUL TO walk on heels and toes. Romberg is negative.  Reflexes: BUE 2, KNEES 1, ANKLES 0. DOWN GOING TOES.   DIAGNOSTIC DATA (LABS, IMAGING, TESTING) - I reviewed patient records, labs, notes, testing and imaging myself where available.  TSH 5.410 (h)  B12 1378  A1C 5.8   MRI lumbar spine (without) 04/07/13 - The MRI study of the lumbar spine shows neuroforaminal stenosis at the L4-5 level primarily to the left. On the clinical note, the patient was mainly having right leg pain.  03/26/13 RIGHT HIP - COMPLETE 2+ VIEW- No fracture. Mild degenerative joint disease.  ASSESSMENT AND PLAN Mr. Nira ConnGerald Shepardson ia a 80 y.o. year old male has a past medical history of Hypertension; High cholesterol; Benign prostatic hyperplasia; Rhinitis; Intractable hiccups; Asthma; Esophagitis; and Gilbert's syndrome here with PAIN IN THE FEET.  Ddx: neuropathy (metabolic, toxic, idiopathic) vs. bilateral S1 radiculopathies   PLAN:  1. Continue gabapentin 600 mg TID as tolerated 2. May repeat EMG/NCS in future if pain worsens. 3. Follow up in 6 months with Dr. Marjory LiesPenumalli.  Ronal FearLYNN E. Ebbie Sorenson, MSN, NP-C 07/01/2013, 11:29 AM [facility] [address], [address] [place], [place] [id number] [phone]  Note: This document was prepared with digital dictation and possible smart phrase technology. Any transcriptional  errors that result from this process are unintentional.

## 2013-07-01 NOTE — Patient Instructions (Signed)
PLAN:  1. Continue gabapentin 600 mg three times a day as tolerated. 4. May repeat EMG/NCS in future if pain in feet worsens. 5. Follow up in 6 months with Dr. Marjory LiesPenumalli.

## 2013-07-03 NOTE — Progress Notes (Signed)
I reviewed note and agree with plan.   Dago Jungwirth R. Aeson Sawyers, MD 07/03/2013, 1:49 PM Certified in Neurology, Neurophysiology and Neuroimaging  [facility] [address], [address] [place], [place] [address] [phone]  

## 2013-10-14 ENCOUNTER — Other Ambulatory Visit: Payer: Self-pay | Admitting: Gastroenterology

## 2013-12-16 ENCOUNTER — Other Ambulatory Visit: Payer: Self-pay

## 2013-12-16 MED ORDER — GABAPENTIN 300 MG PO CAPS: 600.0000 mg | ORAL_CAPSULE | Freq: Three times a day (TID) | ORAL | Status: DC

## 2013-12-30 ENCOUNTER — Encounter: Payer: Self-pay | Admitting: Diagnostic Neuroimaging

## 2013-12-30 ENCOUNTER — Ambulatory Visit (INDEPENDENT_AMBULATORY_CARE_PROVIDER_SITE_OTHER): Payer: Medicare Other | Admitting: Diagnostic Neuroimaging

## 2013-12-30 VITALS — BP 142/69 | HR 89 | Temp 97.8°F | Ht 70.0 in | Wt 158.2 lb

## 2013-12-30 DIAGNOSIS — G629 Polyneuropathy, unspecified: Secondary | ICD-10-CM

## 2013-12-30 DIAGNOSIS — G589 Mononeuropathy, unspecified: Secondary | ICD-10-CM

## 2013-12-30 MED ORDER — GABAPENTIN 300 MG PO CAPS: 600.0000 mg | ORAL_CAPSULE | Freq: Three times a day (TID) | ORAL | Status: DC

## 2013-12-30 NOTE — Patient Instructions (Signed)
Continue gabapentin.

## 2013-12-30 NOTE — Progress Notes (Signed)
PATIENT: Brent Yu DOB: [birth year]   REASON FOR VISIT: follow up for neuropathy HISTORY FROM: patient  HISTORY OF PRESENT ILLNESS:  UPDATE 12/30/13: Since last visit, doing well. Tolerating gabapentin 600, 300, 600. Planning to move to retirement community [facility]). No new issues.  UPDATE 07/01/13 (LL):  Brent Yu comes back for revisit.  He continues with Gabapentin 2 caps at 10 am, 1 cap at 5 pm, and 2 caps at 11 pm.  He states it helps the pain, was 8/10 on initial visit, is now averaging 4-5/10, with burning aching pain in the bottom of his feet after standing.  The feet do not hurt at night.  His back and hip issues are resolved.  No new complaints.  UPDATE 03/26/13 (LL): Brent Yu comes back for revisit. Since last visit, he slowly increased the Gabapentin to 600 mg 3 times a day, which he states made his unbalanced. He fell approximately 3 weeks ago on his right hip but di not seek medical attention right away. The next few days he had increasing pain in the hip and went to his PCP who scheduled him with orthopedics. When it came time for the appointment, he was feeling better, so he cancelled the appointment. Soon after cancelling the appointment, he started having pain again in the right hip with pain radiating down the back of the right thigh. He now takes his Gabapentin 2 caps at 10 am, 1 cap at 5 pm, and 2 caps at 11 pm. He states it helps the pain, was 8/10 on initial visit, is now averaging 6/10, with burning aching pain in the bottom of his feet. He recently started on thyroid medication for hypothyroidism.   UPDATE 10/08/12: Since last visit, tried compounded neuropathy cream for 20 days without relief. Patient's symptoms are persistent. Still with burning aching pain in the bottom of his feet. No back pain.   PRIOR HPI: 80 year old male with history of hypertension, hypercholesterolemia, here for evaluation of pain and numbness in his feet.  In 2005 patient  developed gradual onset pain in the bilateral heel regions. Gradually this progressed anteriorly. He was diagnosed with tarsal tunnel syndrome. He was treated with a number of surgeries including right gastrocnemius slide surgery in 2012, followed by bilateral tarsal tunnel syndrome decompression surgeries in March 2013. Patient has had minimal relief from these procedures.  Patient continues to have aching and burning pain in the anterior balls of his feet. He denies any low back pain radiating to the lower extremities. He has tried Voltaren gel without relief. he's tried foot orthotics without relief. Walking or standing for a long time seems to aggravate his symptoms.   REVIEW OF SYSTEMS: Full 14 system review of systems performed and notable only for numbness and pain in feet. Ringing in ears.   ALLERGIES: No Known Allergies  HOME MEDICATIONS: Outpatient Encounter Prescriptions as of 12/30/2013  Medication Sig  . amLODipine (NORVASC) 5 MG tablet Take 5 mg by mouth daily.  Marland Kitchen atorvastatin (LIPITOR) 40 MG tablet Take 40 mg by mouth daily.  . cetirizine (ZYRTEC) 10 MG tablet Take 10 mg by mouth daily.  Marland Kitchen EPIPEN 2-PAK 0.3 MG/0.3ML SOAJ injection   . fish oil-omega-3 fatty acids 1000 MG capsule Take 2 g by mouth daily.  . Fluticasone-Salmeterol (ADVAIR) 100-50 MCG/DOSE AEPB Inhale 1 puff into the lungs 2 (two) times daily.   Marland Kitchen gabapentin (NEURONTIN) 300 MG capsule Take 2 capsules (600 mg total) by mouth 3 (three)  times daily.  Marland Kitchen. levothyroxine (SYNTHROID, LEVOTHROID) 25 MCG tablet Take 25 mcg by mouth daily before breakfast.  . Multiple Vitamins-Minerals (CENTRUM SILVER PO) Take 1 tablet by mouth daily.  Marland Kitchen. omeprazole (PRILOSEC) 10 MG capsule Take 10 mg by mouth daily.  . ranitidine (ZANTAC) 75 MG tablet Take 75 mg by mouth daily.   . sildenafil (VIAGRA) 100 MG tablet Take 5 mg by mouth as needed for erectile dysfunction.  Marland Kitchen. telmisartan-hydrochlorothiazide (MICARDIS HCT) 80-12.5 MG per tablet Take  1 tablet by mouth daily.  Marland Kitchen. UNABLE TO FIND Inject as directed once a week. Med Name: allergy shot  . [DISCONTINUED] cetirizine (ZYRTEC) 10 MG chewable tablet Chew 10 mg by mouth daily.  . [DISCONTINUED] gabapentin (NEURONTIN) 300 MG capsule Take 2 capsules (600 mg total) by mouth 3 (three) times daily.    PAST MEDICAL HISTORY: Past Medical History  Diagnosis Date  . Hypertension   . High cholesterol   . Benign prostatic hyperplasia   . Rhinitis   . Intractable hiccups   . Asthma   . Esophagitis   . Gilbert's syndrome     PAST SURGICAL HISTORY: Past Surgical History  Procedure Laterality Date  . Repair subluxing tendon with gastroc slide    . Transurethral resection of prostate    . Inguinal hernia repair    . Gastric tumor    . Tonsillectomy    . Topaz      FAMILY HISTORY: Family History  Problem Relation Age of Onset  . Cancer Mother   . Stroke Father     SOCIAL HISTORY: History   Social History  . Marital Status: Married    Spouse Name: susie    Number of Children: 2  . Years of Education: BS   Occupational History  . 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 Smoking status: Former Smoker    Quit date: 06/18/1962  . Smokeless tobacco: Not on file  . Alcohol Use: No  . Drug Use: No  . Sexual Activity: Not on file   Other Topics Concern  . Not on file   Social History Narrative   Pt lives at home with his spouse.   Caffeine Use- Does not consume     PHYSICAL EXAM  Filed Vitals:   12/30/13 1414  BP: 142/69  Pulse: 89  Temp: 97.8 F (36.6 C)  TempSrc: Oral  Height: 5\' 10"  (1.778 m)  Weight: 158 lb 3.2 oz (71.759 kg)   Body mass index is 22.7 kg/(m^2).  EXAM:  General: Patient is awake, alert and in no acute distress. Well developed and groomed.  Neck: Neck is supple.  Cardiovascular: No carotid artery bruits. Heart is regular rate and rhythm with no murmurs.   Neurologic Exam  Mental Status: Awake, alert.  Language is fluent and comprehension intact.  Cranial Nerves: Pupils are equal and reactive to light. Visual fields are full to confrontation. Conjugate eye movements are full and symmetric. Facial sensation and strength are symmetric. Hearing is intact. Palate elevated symmetrically and uvula is midline. Shoulder shrug is symmetric. Tongue is midline.  Motor: Normal bulk and tone. Full strength in the upper and lower extremities. No pronator drift.  Sensory: Intact and symmetric to light touch and vibration Coordination: No ataxia or dysmetria on finger-nose or rapid alternating movement testing.  Gait and Station: Narrow based gait. SLOW AND CAUTIOUS. Romberg is negative.  Reflexes: BUE 2, KNEES 1, ANKLES 0. DOWN GOING TOES.    DIAGNOSTIC  DATA (LABS, IMAGING, TESTING) - I reviewed patient records, labs, notes, testing and imaging myself where available.  TSH 5.410 (h)  B12 1378  A1C 5.8   03/26/13 RIGHT HIP - COMPLETE 2+ VIEW- No fracture. Mild degenerative joint disease.  04/07/13 MRI lumbar spine (without) demonstrating:  1. At L4-5: disc bulging and facet hypertrophy with mild right and severe left foraminal stenosis  2. At L2-3, L3-4: disc bulging and facet hypertrophy with mild biforaminal foraminal stenosis  3. Disc bulging and spondylosis from L2-3 to L5-S1. Scoliosis convex to the left centered at L3   ASSESSMENT AND PLAN 80 y.o. year old male has a past medical history of Hypertension; High cholesterol; Benign prostatic hyperplasia; Rhinitis; Intractable hiccups; Asthma; Esophagitis; and Gilbert's syndrome here with PAIN IN THE FEET.   Dx: neuropathy (small fiber, idiopathic)  PLAN:  1. Continue gabapentin 600 mg BID-TID as tolerated  Return in about 1 year (around 12/31/2014).   Suanne Marker, MD 12/30/2013, 2:37 PM Certified in Neurology, Neurophysiology and Neuroimaging  [facility] [address], [address] [place], [place] [address] [phone]

## 2013-12-31 ENCOUNTER — Encounter: Payer: Self-pay | Admitting: Nurse Practitioner

## 2014-12-31 ENCOUNTER — Ambulatory Visit (INDEPENDENT_AMBULATORY_CARE_PROVIDER_SITE_OTHER): Payer: Medicare Other | Admitting: Diagnostic Neuroimaging

## 2014-12-31 ENCOUNTER — Encounter: Payer: Self-pay | Admitting: Diagnostic Neuroimaging

## 2014-12-31 VITALS — BP 124/71 | HR 61 | Ht 70.0 in | Wt 164.2 lb

## 2014-12-31 DIAGNOSIS — G609 Hereditary and idiopathic neuropathy, unspecified: Secondary | ICD-10-CM | POA: Insufficient documentation

## 2014-12-31 MED ORDER — GABAPENTIN 800 MG PO TABS: 800.0000 mg | ORAL_TABLET | Freq: Three times a day (TID) | ORAL | Status: DC

## 2014-12-31 NOTE — Progress Notes (Signed)
PATIENT: Brent Yu DOB: 28-Feb-1933   REASON FOR VISIT: follow up for neuropathy HISTORY FROM: patient  Chief Complaint  Patient presents with  . Neuropathy    rm 7  . Follow-up    HISTORY OF PRESENT ILLNESS:  UPDATE 12/31/14: Since last visit, neuropathy stable. Gabapentin helping. On gabapentin 600mg  TID. Takes edge off pain.   UPDATE 12/30/13: Since last visit, doing well. Tolerating gabapentin 600, 300, 600. Planning to move to retirement community soon [facility]([place] No new issues.  UPDATE 07/01/13 (LL):  Brent Yu comes back for revisit.  He continues with Gabapentin 2 caps at 10 am, 1 cap at 5 pm, and 2 caps at 11 pm.  He states it helps the pain, was 8/10 on initial visit, is now averaging 4-5/10, with burning aching pain in the bottom of his feet after standing.  The feet do not hurt at night.  His back and hip issues are resolved.  No new complaints.  UPDATE 03/26/13 (LL): Brent Yu comes back for revisit. Since last visit, he slowly increased the Gabapentin to 600 mg 3 times a day, which he states made his unbalanced. He fell approximately 3 weeks ago on his right hip but di not seek medical attention right away. The next few days he had increasing pain in the hip and went to his PCP who scheduled him with orthopedics. When it came time for the appointment, he was feeling better, so he cancelled the appointment. Soon after cancelling the appointment, he started having pain again in the right hip with pain radiating down the back of the right thigh. He now takes his Gabapentin 2 caps at 10 am, 1 cap at 5 pm, and 2 caps at 11 pm. He states it helps the pain, was 8/10 on initial visit, is now averaging 6/10, with burning aching pain in the bottom of his feet. He recently started on thyroid medication for hypothyroidism.   UPDATE 10/08/12: Since last visit, tried compounded neuropathy cream for 20 days without relief. Patient's symptoms are persistent. Still with burning aching  pain in the bottom of his feet. No back pain.   PRIOR HPI: 81 year old male with history of hypertension, hypercholesterolemia, here for evaluation of pain and numbness in his feet. In 2005 patient developed gradual onset pain in the bilateral heel regions. Gradually this progressed anteriorly. He was diagnosed with tarsal tunnel syndrome. He was treated with a number of surgeries including right gastrocnemius slide surgery in 2012, followed by bilateral tarsal tunnel syndrome decompression surgeries in March 2013. Patient has had minimal relief from these procedures. Patient continues to have aching and burning pain in the anterior balls of his feet. He denies any low back pain radiating to the lower extremities. He has tried Voltaren gel without relief. he's tried foot orthotics without relief. Walking or standing for a long time seems to aggravate his symptoms.    REVIEW OF SYSTEMS: Full 14 system review of systems performed and notable only for ringing in ears insomnia daytime sleepiness itching.    ALLERGIES: Allergies  Allergen Reactions  . Lisinopril Cough    cough    HOME MEDICATIONS: Outpatient Encounter Prescriptions as of 12/31/2014  Medication Sig  . albuterol (PROVENTIL HFA;VENTOLIN HFA) 108 (90 BASE) MCG/ACT inhaler Inhale into the lungs every 6 (six) hours as needed for wheezing or shortness of breath.  Marland Kitchen. amLODipine (NORVASC) 5 MG tablet Take 5 mg by mouth daily.  Marland Kitchen. atorvastatin (LIPITOR) 40 MG tablet Take 40  mg by mouth daily.  . cetirizine (ZYRTEC) 10 MG tablet Take 10 mg by mouth daily.  . fish oil-omega-3 fatty acids 1000 MG capsule Take 2 g by mouth daily.  . Fluticasone-Salmeterol (ADVAIR) 100-50 MCG/DOSE AEPB Inhale 1 puff into the lungs 2 (two) times daily.   Marland Kitchen gabapentin (NEURONTIN) 300 MG capsule Take 2 capsules (600 mg total) by mouth 3 (three) times daily.  Marland Kitchen levothyroxine (SYNTHROID, LEVOTHROID) 25 MCG tablet Take 25 mcg by mouth daily before breakfast.  .  Multiple Vitamins-Minerals (CENTRUM SILVER PO) Take 1 tablet by mouth daily.  Marland Kitchen omeprazole (PRILOSEC) 10 MG capsule Take 10 mg by mouth daily.  . ranitidine (ZANTAC) 75 MG tablet Take 75 mg by mouth daily.   . sildenafil (VIAGRA) 100 MG tablet Take 5 mg by mouth as needed for erectile dysfunction.  Marland Kitchen telmisartan-hydrochlorothiazide (MICARDIS HCT) 80-12.5 MG per tablet Take 1 tablet by mouth daily.  Marland Kitchen UNABLE TO FIND Inject as directed once a week. Med Name: allergy shot  . EPIPEN 2-PAK 0.3 MG/0.3ML SOAJ injection    No facility-administered encounter medications on file as of 12/31/2014.    PAST MEDICAL HISTORY: Past Medical History  Diagnosis Date  . Hypertension   . High cholesterol   . Benign prostatic hyperplasia   . Rhinitis   . Intractable hiccups   . Asthma   . Esophagitis   . Gilbert's syndrome   . Environmental allergies     PAST SURGICAL HISTORY: Past Surgical History  Procedure Laterality Date  . Repair subluxing tendon with gastroc slide    . Transurethral resection of prostate    . Inguinal hernia repair    . Gastric tumor    . Tonsillectomy    . Topaz      FAMILY HISTORY: Family History  Problem Relation Age of Onset  . Cancer Mother   . Stroke Father     SOCIAL HISTORY: History   Social History  . Marital Status: Married    Spouse Name: susie  . Number of Children: 2  . Years of Education: BS   Occupational History  . 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 Smoking status: Former Smoker    Quit date: 06/18/1962  . Smokeless tobacco: Not on file  . Alcohol Use: No  . Drug Use: No  . Sexual Activity: Not on file   Other Topics Concern  . Not on file   Social History Narrative   Pt lives at home with his spouse.   Caffeine Use- Does not consume     PHYSICAL EXAM  Filed Vitals:   12/31/14 1138  BP: 124/71  Pulse: 61  Height: 5\' 10"  (1.778 m)  Weight: 164 lb 3.2 oz (74.481 kg)   Body mass index is  23.56 kg/(m^2).  EXAM:  General: Patient is awake, alert and in no acute distress. Well developed and groomed.  Neck: Neck is supple.  Cardiovascular: No carotid artery bruits. Heart is regular rate and rhythm with no murmurs.   Neurologic Exam  Mental Status: Awake, alert. Language is fluent and comprehension intact.  Cranial Nerves: Pupils are equal and reactive to light. Visual fields are full to confrontation. Conjugate eye movements are full and symmetric. Facial sensation and strength are symmetric. Hearing is intact. Palate elevated symmetrically and uvula is midline. Shoulder shrug is symmetric. Tongue is midline.  Motor: Normal bulk and tone. Full strength in the upper and lower extremities. No pronator drift.  Sensory: Intact  and symmetric to light touch, temp; VIB 8 SEC AT TOES Coordination: No ataxia or dysmetria on finger-nose or rapid alternating movement testing.  Gait and Station: Narrow based gait. SLOW AND CAUTIOUS. Romberg is negative.  Reflexes: BUE 1, KNEES 1, ANKLES 0.    DIAGNOSTIC DATA (LABS, IMAGING, TESTING)  TSH 5.410 (h)  B12 1378  A1C 5.8   03/26/13 RIGHT HIP - COMPLETE 2+ VIEW- No fracture. Mild degenerative joint disease.  04/07/13 MRI lumbar spine (without) demonstrating:  1. At L4-5: disc bulging and facet hypertrophy with mild right and severe left foraminal stenosis  2. At L2-3, L3-4: disc bulging and facet hypertrophy with mild biforaminal foraminal stenosis  3. Disc bulging and spondylosis from L2-3 to L5-S1. Scoliosis convex to the left centered at L3   ASSESSMENT AND PLAN 81 y.o. year old male has a past medical history of Hypertension; High cholesterol; Benign prostatic hyperplasia; Rhinitis; Intractable hiccups; Asthma; Esophagitis; and Gilbert's syndrome here with PAIN IN THE FEET.   Dx: neuropathy (small fiber, idiopathic)  PLAN:  I spent 15 minutes of face to face time with patient. Greater than 50% of time was spent in counseling and  coordination of care with patient. In summary we discussed: - increase gabapentin to  TID - may consider trial of low dose amitriptyline at bedtime in future for neuropathic pain and insomnia  Meds ordered this encounter  Medications  . gabapentin (NEURONTIN) 800 MG tablet    Sig: Take 1 tablet (800 mg total) by mouth 3 (three) times daily.    Dispense:  270 tablet    Refill:  4   Return in about 1 year (around 12/31/2015).    Suanne Marker, MD 12/31/2014, 12:20 PM Certified in Neurology, Neurophysiology and Neuroimaging  [facility] [address] [place], [place] [address] [phone]

## 2014-12-31 NOTE — Patient Instructions (Signed)
Increase gabapentin to  three times per day.

## 2015-01-04 ENCOUNTER — Telehealth: Payer: Self-pay | Admitting: Diagnostic Neuroimaging

## 2015-01-04 NOTE — Telephone Encounter (Signed)
I called back.  Spoke with Felipe Dronealeia.  She then transferred me to [facility].  I verified Rx.  They will proceed with order as prescribed and call us back if anything further is needed.

## 2015-01-04 NOTE — Telephone Encounter (Signed)
Monet called and requested to speak with someone regarding verification on a medication for the patient. Rx GABAPENTIN. Please call and advise.

## 2015-04-18 ENCOUNTER — Other Ambulatory Visit: Payer: Self-pay | Admitting: Diagnostic Neuroimaging

## 2015-05-19 ENCOUNTER — Telehealth: Payer: Self-pay

## 2015-05-19 ENCOUNTER — Telehealth: Payer: Self-pay | Admitting: Diagnostic Neuroimaging

## 2015-05-19 NOTE — Telephone Encounter (Signed)
Last OV note says increase Gabapentin to 800mg  TID, one year Rx was sent at that time.  I called the patient back to clarify.  Got no answer.  Left message.

## 2015-05-19 NOTE — Telephone Encounter (Signed)
I spoke with the patient.  Says he tried going up to Gabapentin 800mg  TID as instructed at last OV, but felt a bit "off balance" on that dose.  He had old 300mg  caps remaining and said he has been slowly working his way up.  He is currently taking 800mg  in am 600mg  (two 300mg  caps) in afternoon and 800mg  at night.  Would like to know if he could possibly have a Rx for 300mg  two daily #60 sent to local [facility] so he can continue titrating up to 800mg  TID.  Please advise.  Thank you.

## 2015-05-19 NOTE — Telephone Encounter (Signed)
Pt called and is asking for a refill on Gabapentin 300mg  2x daily. He states that he is slowly increasing his dosage but says that he becomes off balance if he increases dosage to fast. Last refill request was denied. Please call and advise [phone]

## 2015-05-19 NOTE — Telephone Encounter (Signed)
Yes ok to change. -VRP 

## 2015-05-20 MED ORDER — GABAPENTIN 300 MG PO CAPS: 600.0000 mg | ORAL_CAPSULE | Freq: Every day | ORAL | Status: DC

## 2015-05-20 NOTE — Telephone Encounter (Signed)
Thank you.  Rx has been sent.  Receipt confirmed by pharmacy.   Patient is aware.

## 2015-07-20 DIAGNOSIS — M79661 Pain in right lower leg: Secondary | ICD-10-CM | POA: Diagnosis not present

## 2015-08-03 DIAGNOSIS — M79605 Pain in left leg: Secondary | ICD-10-CM | POA: Diagnosis not present

## 2015-08-18 DIAGNOSIS — J301 Allergic rhinitis due to pollen: Secondary | ICD-10-CM | POA: Diagnosis not present

## 2015-09-22 ENCOUNTER — Other Ambulatory Visit: Payer: Self-pay | Admitting: Family Medicine

## 2015-09-22 DIAGNOSIS — M5416 Radiculopathy, lumbar region: Secondary | ICD-10-CM

## 2015-09-23 DIAGNOSIS — L814 Other melanin hyperpigmentation: Secondary | ICD-10-CM | POA: Diagnosis not present

## 2015-09-23 DIAGNOSIS — L578 Other skin changes due to chronic exposure to nonionizing radiation: Secondary | ICD-10-CM | POA: Diagnosis not present

## 2015-09-25 ENCOUNTER — Ambulatory Visit
Admission: RE | Admit: 2015-09-25 | Discharge: 2015-09-25 | Disposition: A | Discharge status: Home / Self Care | Payer: Medicare Other | Source: Ambulatory Visit | Attending: Family Medicine | Admitting: Family Medicine

## 2015-09-25 DIAGNOSIS — M5126 Other intervertebral disc displacement, lumbar region: Secondary | ICD-10-CM | POA: Diagnosis not present

## 2015-09-25 DIAGNOSIS — M5416 Radiculopathy, lumbar region: Secondary | ICD-10-CM

## 2015-09-26 ENCOUNTER — Telehealth: Payer: Self-pay | Admitting: Diagnostic Neuroimaging

## 2015-09-26 NOTE — Telephone Encounter (Signed)
Patient and wife called, [facility], ordered by Dr. Clarene DukeLittle, was told patient has herniated disc, was told Dr. Clarene DukeLittle is out of the office until Wednesday and [facility] was supposed to send MRI results over to Dr. Marjory LiesPenumalli so that he could be referred somewhere to have herniated disc taken care of, patient is in a lot of pain. Please call [phone].

## 2015-09-27 NOTE — Telephone Encounter (Signed)
Spoke with patient and relayed Dr Richrd HumblesPenumalli's recommendations. He would like to be seen; scheduled for FU tomorrow to review MRI, discuss possible neurosurgery consult.  Patient verbal,ized understanding, appreciation.

## 2015-09-27 NOTE — Telephone Encounter (Signed)
i can see patient in clinic to review symptoms and scan. Or if he wants, we can refer to NSGY. -VRP

## 2015-09-28 ENCOUNTER — Encounter: Payer: Self-pay | Admitting: Diagnostic Neuroimaging

## 2015-09-28 ENCOUNTER — Ambulatory Visit (INDEPENDENT_AMBULATORY_CARE_PROVIDER_SITE_OTHER): Payer: Medicare Other | Admitting: Diagnostic Neuroimaging

## 2015-09-28 VITALS — BP 126/70 | HR 65 | Ht 70.0 in | Wt 159.0 lb

## 2015-09-28 DIAGNOSIS — G609 Hereditary and idiopathic neuropathy, unspecified: Secondary | ICD-10-CM

## 2015-09-28 DIAGNOSIS — M5416 Radiculopathy, lumbar region: Secondary | ICD-10-CM

## 2015-09-28 DIAGNOSIS — G629 Polyneuropathy, unspecified: Secondary | ICD-10-CM

## 2015-09-28 DIAGNOSIS — M5417 Radiculopathy, lumbosacral region: Secondary | ICD-10-CM | POA: Diagnosis not present

## 2015-09-28 MED ORDER — TRAMADOL HCL 50 MG PO TABS: 50.0000 mg | ORAL_TABLET | Freq: Three times a day (TID) | ORAL | Status: DC | PRN

## 2015-09-28 MED ORDER — CYCLOBENZAPRINE HCL 10 MG PO TABS: 10.0000 mg | ORAL_TABLET | Freq: Three times a day (TID) | ORAL | Status: DC | PRN

## 2015-09-28 NOTE — Patient Instructions (Signed)
Thank you for coming to see [place] at [facility]. I hope we have been able to provide you high quality care today.  You may receive a patient satisfaction survey over the next few weeks. We would appreciate your feedback and comments so that we may continue to improve ourselves and the health of our patients.  - I will setup neurosurgery evaluation --> eval and treat left L4-5 disc hernation; severe back pain, buttock pain, left calf pain, left leg weakness since Jan 2017; eval for surgical treatment options vs epidural steroid injection / pain mgmt; refer to first available: Dr. Ellene Route, Aletta Edouard, Sherwood Gambler or [place]     ~~~~~~~~~~~~~~~~~~~~~~~~~~~~~~~~~~~~~~~~~~~~~~~~~~~~~~~~~~~~~~~~~  DR. PENUMALLI'S GUIDE TO HAPPY AND HEALTHY LIVING These are some of my general health and wellness recommendations. Some of them may apply to you better than others. Please use common sense as you try these suggestions and feel free to ask me any questions.   ACTIVITY/FITNESS Mental, social, emotional and physical stimulation are very important for brain and body health. Try learning a new activity (arts, music, language, sports, games).  Keep moving your body to the best of your abilities. You can do this at home, inside or outside, the park, community center, gym or anywhere you like. Consider a physical therapist or personal trainer to get started. Consider the app Sworkit. Fitness trackers such as smart-watches, smart-phones or Fitbits can help as well.   NUTRITION Eat more plants: colorful vegetables, nuts, seeds and berries.  Eat less sugar, salt, preservatives and processed foods.  Avoid toxins such as cigarettes and alcohol.  Drink water when you are thirsty. Warm water with a slice of lemon is an excellent morning drink to start the day.  Consider these websites for more information The Nutrition Source (https://www.henry-hernandez.biz/) Precision Nutrition  (WindowBlog.ch)   RELAXATION Consider practicing mindfulness meditation or other relaxation techniques such as deep breathing, prayer, yoga, tai chi, massage. See website mindful.org or the apps Headspace or Calm to help get started.   SLEEP Try to get at least 7-8+ hours sleep per day. Regular exercise and reduced caffeine will help you sleep better. Practice good sleep hygeine techniques. See website sleep.org for more information.   PLANNING Prepare estate planning, living will, healthcare POA documents. Sometimes this is best planned with the help of an attorney. Theconversationproject.org and agingwithdignity.org are excellent resources.

## 2015-09-28 NOTE — Progress Notes (Signed)
PATIENT: Brent Yu DOB: 02-Jun-1933   REASON FOR VISIT: follow up for neuropathy and new left leg pain HISTORY FROM: patient and wife  Chief Complaint  Patient presents with  . Hereditary, idoipathic neuropathy    rm 7, review MRI L spine, "pain, sensitivity in L leg to calf, hip; needs assistance to get into, out of bed, sitting laying down are difficult/painful"  . Follow-up    wife-Brent Yu    HISTORY OF PRESENT ILLNESS:  UPDATE 09/28/15: Since last visit, neuropathy is stable. However, developed new left calf pain in Jan 2017. Then pain moved to left thigh, hamstring and buttock in March 2017. MRI lumbar spine ordered per PCP, and now shows new left L4-5 disc herniation. Patient has sig pain, interrupting sleep and functioning. Sxs better later in day after he has "warmed up". He feels weakness in left foot. Balance is off. Using heat pack and ibuprofen.   UPDATE 12/31/14: Since last visit, neuropathy stable. Gabapentin helping. On gabapentin  TID. Takes edge off pain.   UPDATE 12/30/13: Since last visit, doing well. Tolerating gabapentin 600, 300, 600. Planning to move to retirement community soon [facility]). No new issues.  UPDATE 07/01/13 (LL):  Brent Yu comes back for revisit.  He continues with Gabapentin 2 caps at 10 am, 1 cap at 5 pm, and 2 caps at 11 pm.  He states it helps the pain, was 8/10 on initial visit, is now averaging 4-5/10, with burning aching pain in the bottom of his feet after standing.  The feet do not hurt at night.  His back and hip issues are resolved.  No new complaints.  UPDATE 03/26/13 (LL): Brent Yu comes back for revisit. Since last visit, he slowly increased the Gabapentin to 600 mg 3 times a day, which he states made his unbalanced. He fell approximately 3 weeks ago on his right hip but di not seek medical attention right away. The next few days he had increasing pain in the hip and went to his PCP who scheduled him with orthopedics. When it  came time for the appointment, he was feeling better, so he cancelled the appointment. Soon after cancelling the appointment, he started having pain again in the right hip with pain radiating down the back of the right thigh. He now takes his Gabapentin 2 caps at 10 am, 1 cap at 5 pm, and 2 caps at 11 pm. He states it helps the pain, was 8/10 on initial visit, is now averaging 6/10, with burning aching pain in the bottom of his feet. He recently started on thyroid medication for hypothyroidism.   UPDATE 10/08/12: Since last visit, tried compounded neuropathy cream for 20 days without relief. Patient's symptoms are persistent. Still with burning aching pain in the bottom of his feet. No back pain.   PRIOR HPI: 82 year old male with history of hypertension, hypercholesterolemia, here for evaluation of pain and numbness in his feet. In 2005 patient developed gradual onset pain in the bilateral heel regions. Gradually this progressed anteriorly. He was diagnosed with tarsal tunnel syndrome. He was treated with a number of surgeries including right gastrocnemius slide surgery in 2012, followed by bilateral tarsal tunnel syndrome decompression surgeries in March 2013. Patient has had minimal relief from these procedures. Patient continues to have aching and burning pain in the anterior balls of his feet. He denies any low back pain radiating to the lower extremities. He has tried Voltaren gel without relief. he's tried foot orthotics without relief.  Walking or standing for a long time seems to aggravate his symptoms.    REVIEW OF SYSTEMS: Full 14 system review of systems performed and negative except for: env allergies aching muscles ringing in ears snoring.     ALLERGIES: Allergies  Allergen Reactions  . Lisinopril Cough    cough    HOME MEDICATIONS: Outpatient Encounter Prescriptions as of 09/28/2015  Medication Sig  . albuterol (PROVENTIL HFA;VENTOLIN HFA) 108 (90 BASE) MCG/ACT inhaler Inhale into the  lungs every 6 (six) hours as needed for wheezing or shortness of breath.  Marland Kitchen. amLODipine (NORVASC) 5 MG tablet Take 5 mg by mouth daily.  Marland Kitchen. atorvastatin (LIPITOR) 40 MG tablet Take 40 mg by mouth daily.  Marland Kitchen. EPIPEN 2-PAK 0.3 MG/0.3ML SOAJ injection   . fish oil-omega-3 fatty acids 1000 MG capsule Take 2 g by mouth daily.  . Fluticasone-Salmeterol (ADVAIR) 100-50 MCG/DOSE AEPB Inhale 1 puff into the lungs 2 (two) times daily.   Marland Kitchen. gabapentin (NEURONTIN) 800 MG tablet Take 1 tablet (800 mg total) by mouth 3 (three) times daily.  Marland Kitchen. ibuprofen (ADVIL,MOTRIN) 200 MG tablet Take 200 mg by mouth every 6 (six) hours as needed.  Marland Kitchen. levothyroxine (SYNTHROID, LEVOTHROID) 25 MCG tablet Take 25 mcg by mouth daily before breakfast.  . Multiple Vitamins-Minerals (CENTRUM SILVER PO) Take 1 tablet by mouth daily.  Marland Kitchen. omeprazole (PRILOSEC) 10 MG capsule Take 10 mg by mouth daily.  . ranitidine (ZANTAC) 75 MG tablet Take 75 mg by mouth daily.   . sildenafil (VIAGRA) 100 MG tablet Take 5 mg by mouth as needed for erectile dysfunction.  Marland Kitchen. telmisartan-hydrochlorothiazide (MICARDIS HCT) 80-12.5 MG per tablet Take 1 tablet by mouth daily.  Marland Kitchen. UNABLE TO FIND Inject as directed once a week. Med Name: allergy shot  . cyclobenzaprine (FLEXERIL) 10 MG tablet Take 1 tablet (10 mg total) by mouth 3 (three) times daily as needed for muscle spasms.  . traMADol (ULTRAM) 50 MG tablet Take 1 tablet (50 mg total) by mouth every 8 (eight) hours as needed.  . [DISCONTINUED] cetirizine (ZYRTEC) 10 MG tablet Take 10 mg by mouth daily.  . [DISCONTINUED] gabapentin (NEURONTIN) 300 MG capsule Take 2 capsules (600 mg total) by mouth daily. As directed   No facility-administered encounter medications on file as of 09/28/2015.    PAST MEDICAL HISTORY: Past Medical History  Diagnosis Date  . Hypertension   . High cholesterol   . Benign prostatic hyperplasia   . Rhinitis   . Intractable hiccups   . Asthma   . Esophagitis   . Gilbert's  syndrome   . Environmental allergies     PAST SURGICAL HISTORY: Past Surgical History  Procedure Laterality Date  . Repair subluxing tendon with gastroc slide    . Transurethral resection of prostate    . Inguinal hernia repair    . Gastric tumor    . Tonsillectomy    . Topaz      FAMILY HISTORY: Family History  Problem Relation Age of Onset  . Cancer Mother   . Stroke Father     SOCIAL HISTORY: Social History   Social History  . Marital Status: Married    Spouse Name: Brent Yu  . Number of Children: 2  . Years of Education: BS   Occupational History  . 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 Smoking status: Former Smoker    Quit date: 06/18/1962  . Smokeless tobacco: Not on file  . Alcohol Use: No  .  Drug Use: No  . Sexual Activity: Not on file   Other Topics Concern  . Not on file   Social History Narrative   Pt lives at home with his spouse.   Caffeine Use- Does not consume     PHYSICAL EXAM  Filed Vitals:   09/28/15 0948  BP: 126/70  Pulse: 65  Height:  (1.778 m)  Weight: 159 lb (72.122 kg)   Body mass index is 22.81 kg/(m^2).  GENERAL EXAM/CONSTITUTIONAL: Vitals:  Filed Vitals:   09/28/15 0948  BP: 126/70  Pulse: 65  Height:  (1.778 m)  Weight: 159 lb (72.122 kg)     Patient is in no distress; well developed, nourished and groomed; neck is supple  CARDIOVASCULAR:  Examination of carotid arteries is normal; no carotid bruits  Regular rate and rhythm, no murmurs  Examination of peripheral vascular system by observation and palpation is normal  EYES:  Ophthalmoscopic exam of optic discs and posterior segments is normal; no papilledema or hemorrhages  MUSCULOSKELETAL:  Gait, strength, tone, movements noted in Neurologic exam below  NEUROLOGIC: MENTAL STATUS:   awake, alert, oriented to person, place and time  recent and remote memory intact  normal attention and  concentration  language fluent, comprehension intact, naming intact,   fund of knowledge appropriate  CRANIAL NERVE:   2nd - no papilledema on fundoscopic exam  2nd, 3rd, 4th, 6th - pupils equal and reactive to light, visual fields full to confrontation, extraocular muscles intact, no nystagmus  5th - facial sensation symmetric  7th - facial strength symmetric  8th - hearing intact  9th - palate elevates symmetrically, uvula midline  11th - shoulder shrug symmetric  12th - tongue protrusion midline  MOTOR:   normal bulk and tone, full strength in the BUE, BLE; EXCEPT IN LEFT LEG (HF 4+, KE 5, KF 4+, DF 4+, PF 5); LLE LIMITED BY PAIN  SENSORY:   normal and symmetric to light touch, temperature; EXCEPT VIB 6 SEC AT TOES  COORDINATION:   finger-nose-finger, fine finger movements  REFLEXES:   deep tendon reflexes present and symmetric; EXCEPT ANKLES 0  GAIT/STATION:   ANTALGIC GAIT; LIMPS ON LEFT LEG; SLIGHT DIFF WITH HEEL WALKING ON LEFT FOOT WITH DECR DF    DIAGNOSTIC DATA (LABS, IMAGING, TESTING)  TSH 5.410 (h)  B12 1378  A1C 5.8   03/26/13 RIGHT HIP - COMPLETE 2+ VIEW- No fracture. Mild degenerative joint disease.  04/07/13 MRI lumbar spine (without) demonstrating:  1. At L4-5: disc bulging and facet hypertrophy with mild right and severe left foraminal stenosis  2. At L2-3, L3-4: disc bulging and facet hypertrophy with mild biforaminal foraminal stenosis  3. Disc bulging and spondylosis from L2-3 to L5-S1. Scoliosis convex to the left centered at L3  09/25/15 MRI lumbar spine [I reviewed images myself and agree with interpretation. The left L4-5 disc herniation is a new finding compared to prior MRI on 03/26/13, and is likely symptomatic. -VRP]  1. At L4-5 there is a left paracentral disc protrusion with mass effect on the left intraspinal L5 nerve root. Eccentric left broad-based disc bulge. Moderate left foraminal stenosis.  2. At L3-4 there is a mild  broad-based disc bulge with moderate bilateral facet arthropathy and mild right lateral recess stenosis.     ASSESSMENT AND PLAN 82 y.o. year old male has a past medical history of Hypertension; High cholesterol; Benign prostatic hyperplasia; Rhinitis; Intractable hiccups; Asthma; Esophagitis; and Gilbert's syndrome here with PAIN IN THE  FEET.   Now with new left buttock, leg and calf pain since Jan 2017, worsened in Mar 2017, likely due to new left L4-5 disc herniation with mass effect on left L5 root.  Dx: neuropathy (small fiber, idiopathic) + left L4-5 disc herniation  Neuropathy (HCC)  Lumbar back pain with radiculopathy affecting left lower extremity - Plan: Ambulatory referral to Neurosurgery  Hereditary and idiopathic peripheral neuropathy    PLAN:  - efer to neurosurgery for symptomatic left L4-5 disc herniation and new left LBP and radiation to left leg since Jan 2017 - short term cyclobenz and tramadol for pain relief - continue gabapentin to 800mg  TID for idiopathic small fiber neuropathy    Meds ordered this encounter  Medications  . cyclobenzaprine (FLEXERIL) 10 MG tablet    Sig: Take 1 tablet (10 mg total) by mouth 3 (three) times daily as needed for muscle spasms.    Dispense:  90 tablet    Refill:  3  . traMADol (ULTRAM) 50 MG tablet    Sig: Take 1 tablet (50 mg total) by mouth every 8 (eight) hours as needed.    Dispense:  30 tablet    Refill:  0   Orders Placed This Encounter  Procedures  . Ambulatory referral to Neurosurgery   Return in about 3 months (around 12/28/2015).    Suanne Marker, MD 09/28/2015, 11:03 AM Certified in Neurology, Neurophysiology and Neuroimaging  [facility] [address], [address] [place], [place] [address] [phone]

## 2015-10-05 DIAGNOSIS — Z01812 Encounter for preprocedural laboratory examination: Secondary | ICD-10-CM | POA: Diagnosis not present

## 2015-10-05 DIAGNOSIS — M5126 Other intervertebral disc displacement, lumbar region: Secondary | ICD-10-CM | POA: Diagnosis not present

## 2015-10-06 DIAGNOSIS — J309 Allergic rhinitis, unspecified: Secondary | ICD-10-CM | POA: Diagnosis not present

## 2015-10-06 DIAGNOSIS — I10 Essential (primary) hypertension: Secondary | ICD-10-CM | POA: Diagnosis not present

## 2015-10-06 DIAGNOSIS — N401 Enlarged prostate with lower urinary tract symptoms: Secondary | ICD-10-CM | POA: Diagnosis not present

## 2015-10-06 DIAGNOSIS — M5416 Radiculopathy, lumbar region: Secondary | ICD-10-CM | POA: Diagnosis not present

## 2015-10-06 DIAGNOSIS — L570 Actinic keratosis: Secondary | ICD-10-CM | POA: Diagnosis not present

## 2015-10-06 DIAGNOSIS — E039 Hypothyroidism, unspecified: Secondary | ICD-10-CM | POA: Diagnosis not present

## 2015-10-06 DIAGNOSIS — J45909 Unspecified asthma, uncomplicated: Secondary | ICD-10-CM | POA: Diagnosis not present

## 2015-10-06 DIAGNOSIS — E782 Mixed hyperlipidemia: Secondary | ICD-10-CM | POA: Diagnosis not present

## 2015-10-06 DIAGNOSIS — K219 Gastro-esophageal reflux disease without esophagitis: Secondary | ICD-10-CM | POA: Diagnosis not present

## 2015-10-06 DIAGNOSIS — Z0000 Encounter for general adult medical examination without abnormal findings: Secondary | ICD-10-CM | POA: Diagnosis not present

## 2015-10-10 DIAGNOSIS — M5126 Other intervertebral disc displacement, lumbar region: Secondary | ICD-10-CM | POA: Diagnosis not present

## 2015-10-10 DIAGNOSIS — M5116 Intervertebral disc disorders with radiculopathy, lumbar region: Secondary | ICD-10-CM | POA: Diagnosis not present

## 2015-10-10 DIAGNOSIS — M5127 Other intervertebral disc displacement, lumbosacral region: Secondary | ICD-10-CM | POA: Diagnosis not present

## 2015-10-10 DIAGNOSIS — M4726 Other spondylosis with radiculopathy, lumbar region: Secondary | ICD-10-CM | POA: Diagnosis not present

## 2015-11-09 HISTORY — PX: LUMBAR LAMINECTOMY: SHX95

## 2015-11-23 DIAGNOSIS — E039 Hypothyroidism, unspecified: Secondary | ICD-10-CM | POA: Diagnosis not present

## 2015-11-30 DIAGNOSIS — J301 Allergic rhinitis due to pollen: Secondary | ICD-10-CM | POA: Diagnosis not present

## 2015-11-30 DIAGNOSIS — R351 Nocturia: Secondary | ICD-10-CM | POA: Diagnosis not present

## 2015-11-30 DIAGNOSIS — J3089 Other allergic rhinitis: Secondary | ICD-10-CM | POA: Diagnosis not present

## 2015-11-30 DIAGNOSIS — J4530 Mild persistent asthma, uncomplicated: Secondary | ICD-10-CM | POA: Diagnosis not present

## 2015-11-30 DIAGNOSIS — N401 Enlarged prostate with lower urinary tract symptoms: Secondary | ICD-10-CM | POA: Diagnosis not present

## 2015-12-01 DIAGNOSIS — R945 Abnormal results of liver function studies: Secondary | ICD-10-CM | POA: Diagnosis not present

## 2015-12-01 DIAGNOSIS — E871 Hypo-osmolality and hyponatremia: Secondary | ICD-10-CM | POA: Diagnosis not present

## 2015-12-26 DIAGNOSIS — R945 Abnormal results of liver function studies: Secondary | ICD-10-CM | POA: Diagnosis not present

## 2015-12-26 DIAGNOSIS — E871 Hypo-osmolality and hyponatremia: Secondary | ICD-10-CM | POA: Diagnosis not present

## 2015-12-26 DIAGNOSIS — E782 Mixed hyperlipidemia: Secondary | ICD-10-CM | POA: Diagnosis not present

## 2015-12-29 DIAGNOSIS — R350 Frequency of micturition: Secondary | ICD-10-CM | POA: Diagnosis not present

## 2015-12-29 DIAGNOSIS — N401 Enlarged prostate with lower urinary tract symptoms: Secondary | ICD-10-CM | POA: Diagnosis not present

## 2016-01-02 ENCOUNTER — Ambulatory Visit: Payer: Medicare Other | Admitting: Diagnostic Neuroimaging

## 2016-01-18 ENCOUNTER — Ambulatory Visit (INDEPENDENT_AMBULATORY_CARE_PROVIDER_SITE_OTHER): Payer: Medicare Other | Admitting: Diagnostic Neuroimaging

## 2016-01-18 ENCOUNTER — Encounter: Payer: Self-pay | Admitting: Diagnostic Neuroimaging

## 2016-01-18 VITALS — BP 120/65 | HR 63 | Wt 165.0 lb

## 2016-01-18 DIAGNOSIS — M5417 Radiculopathy, lumbosacral region: Secondary | ICD-10-CM

## 2016-01-18 DIAGNOSIS — G609 Hereditary and idiopathic neuropathy, unspecified: Secondary | ICD-10-CM | POA: Diagnosis not present

## 2016-01-18 DIAGNOSIS — M5416 Radiculopathy, lumbar region: Secondary | ICD-10-CM

## 2016-01-18 MED ORDER — GABAPENTIN 800 MG PO TABS: 800.0000 mg | ORAL_TABLET | Freq: Three times a day (TID) | ORAL | 4 refills | Status: AC

## 2016-01-18 NOTE — Patient Instructions (Signed)
-  continue gabapentin

## 2016-01-18 NOTE — Progress Notes (Signed)
PATIENT: Brent Yu DOB: 1932-11-22   REASON FOR VISIT: follow up for neuropathy and new left leg pain HISTORY FROM: patient  Chief Complaint  Patient presents with  . Other    rm 6, neuropathy, "had back surgery and leg pain is gone; foot pain is no better"  . Follow-up    4 month    HISTORY OF PRESENT ILLNESS:  UPDATE 01/18/16: Since last visit, doing well. Back pain and left calf resolved with lumbar discectomy (May 2017), but burning in bilateral feet is stable.   UPDATE 09/28/15: Since last visit, neuropathy is stable. However, developed new left calf pain in Jan 2017. Then pain moved to left thigh, hamstring and buttock in March 2017. MRI lumbar spine ordered per PCP, and now shows new left L4-5 disc herniation. Patient has sig pain, interrupting sleep and functioning. Sxs better later in day after he has "warmed up". He feels weakness in left foot. Balance is off. Using heat pack and ibuprofen.   UPDATE 12/31/14: Since last visit, neuropathy stable. Gabapentin helping. On gabapentin  TID. Takes edge off pain.   UPDATE 12/30/13: Since last visit, doing well. Tolerating gabapentin 600, 300, 600. Planning to move to retirement community soon [facility]). No new issues.  UPDATE 07/01/13 (LL):  Mr. Bera comes back for revisit.  He continues with Gabapentin 2 caps at 10 am, 1 cap at 5 pm, and 2 caps at 11 pm.  He states it helps the pain, was 8/10 on initial visit, is now averaging 4-5/10, with burning aching pain in the bottom of his feet after standing.  The feet do not hurt at night.  His back and hip issues are resolved.  No new complaints.  UPDATE 03/26/13 (LL): Mr. Kestenbaum comes back for revisit. Since last visit, he slowly increased the Gabapentin to 600 mg 3 times a day, which he states made his unbalanced. He fell approximately 3 weeks ago on his right hip but di not seek medical attention right away. The next few days he had increasing pain in the hip and went to his  PCP who scheduled him with orthopedics. When it came time for the appointment, he was feeling better, so he cancelled the appointment. Soon after cancelling the appointment, he started having pain again in the right hip with pain radiating down the back of the right thigh. He now takes his Gabapentin 2 caps at 10 am, 1 cap at 5 pm, and 2 caps at 11 pm. He states it helps the pain, was 8/10 on initial visit, is now averaging 6/10, with burning aching pain in the bottom of his feet. He recently started on thyroid medication for hypothyroidism.   UPDATE 10/08/12: Since last visit, tried compounded neuropathy cream for 20 days without relief. Patient's symptoms are persistent. Still with burning aching pain in the bottom of his feet. No back pain.   PRIOR HPI: 82 year old male with history of hypertension, hypercholesterolemia, here for evaluation of pain and numbness in his feet. In 2005 patient developed gradual onset pain in the bilateral heel regions. Gradually this progressed anteriorly. He was diagnosed with tarsal tunnel syndrome. He was treated with a number of surgeries including right gastrocnemius slide surgery in 2012, followed by bilateral tarsal tunnel syndrome decompression surgeries in March 2013. Patient has had minimal relief from these procedures. Patient continues to have aching and burning pain in the anterior balls of his feet. He denies any low back pain radiating to the lower extremities.  He has tried Voltaren gel without relief. he's tried foot orthotics without relief. Walking or standing for a long time seems to aggravate his symptoms.    REVIEW OF SYSTEMS: Full 14 system review of systems performed and negative except for: env allergies aching muscles ringing in ears freq urination.   ALLERGIES: Allergies  Allergen Reactions  . Lisinopril Cough    cough    HOME MEDICATIONS: Outpatient Encounter Prescriptions as of 01/18/2016  Medication Sig  . albuterol (PROVENTIL  HFA;VENTOLIN HFA) 108 (90 BASE) MCG/ACT inhaler Inhale into the lungs every 6 (six) hours as needed for wheezing or shortness of breath.  . alfuzosin (UROXATRAL) 10 MG 24 hr tablet 40 mg daily.  Marland Kitchen amLODipine (NORVASC) 5 MG tablet Take 5 mg by mouth daily.  Marland Kitchen EPIPEN 2-PAK 0.3 MG/0.3ML SOAJ injection   . fish oil-omega-3 fatty acids 1000 MG capsule Take 2 g by mouth daily.  . Fluticasone-Salmeterol (ADVAIR) 100-50 MCG/DOSE AEPB Inhale 1 puff into the lungs 2 (two) times daily.   Marland Kitchen gabapentin (NEURONTIN) 800 MG tablet Take 1 tablet (800 mg total) by mouth 3 (three) times daily.  Marland Kitchen ibuprofen (ADVIL,MOTRIN) 200 MG tablet Take 200 mg by mouth every 6 (six) hours as needed.  Marland Kitchen levothyroxine (SYNTHROID, LEVOTHROID) 25 MCG tablet Take 25 mcg by mouth daily before breakfast.  . Multiple Vitamins-Minerals (CENTRUM SILVER PO) Take 1 tablet by mouth daily.  Marland Kitchen omeprazole (PRILOSEC) 10 MG capsule Take 10 mg by mouth daily.  . ranitidine (ZANTAC) 75 MG tablet Take 75 mg by mouth daily.   . sildenafil (VIAGRA) 100 MG tablet Take 5 mg by mouth as needed for erectile dysfunction.  Marland Kitchen telmisartan (MICARDIS) 80 MG tablet 80 mg daily.  Marland Kitchen telmisartan-hydrochlorothiazide (MICARDIS HCT) 80-12.5 MG per tablet Take 1 tablet by mouth daily.  Marland Kitchen UNABLE TO FIND Inject as directed once a week. Med Name: allergy shot  . [DISCONTINUED] gabapentin (NEURONTIN) 800 MG tablet Take 1 tablet (800 mg total) by mouth 3 (three) times daily.  . [DISCONTINUED] atorvastatin (LIPITOR) 40 MG tablet Take 40 mg by mouth daily.  . [DISCONTINUED] cyclobenzaprine (FLEXERIL) 10 MG tablet Take 1 tablet (10 mg total) by mouth 3 (three) times daily as needed for muscle spasms.  . [DISCONTINUED] traMADol (ULTRAM) 50 MG tablet Take 1 tablet (50 mg total) by mouth every 8 (eight) hours as needed.   No facility-administered encounter medications on file as of 01/18/2016.     PAST MEDICAL HISTORY: Past Medical History:  Diagnosis Date  . Asthma   .  Benign prostatic hyperplasia   . Environmental allergies   . Esophagitis   . Gilbert's syndrome   . High cholesterol   . Hypertension   . Intractable hiccups   . Rhinitis     PAST SURGICAL HISTORY: Past Surgical History:  Procedure Laterality Date  . BACK SURGERY  2017   "surgery for pinched nerve"  . gastric tumor    . INGUINAL HERNIA REPAIR    . REPAIR SUBLUXING TENDON WITH GASTROC SLIDE    . TONSILLECTOMY    . topaz    . TRANSURETHRAL RESECTION OF PROSTATE      FAMILY HISTORY: Family History  Problem Relation Age of Onset  . Cancer Mother   . Stroke Father     SOCIAL HISTORY: Social History   Social History  . Marital status: Married    Spouse name: susie  . Number of children: 2  . Years of education: BS   Occupational History  .  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 Smoking status: Former Smoker    Quit date: 06/18/1962  . Smokeless tobacco: Not on file  . Alcohol use No  . Drug use: No  . Sexual activity: Not on file   Other Topics Concern  . Not on file   Social History Narrative   Pt lives at home with his spouse.   Caffeine Use- Does not consume     PHYSICAL EXAM  Vitals:   01/18/16 1458  BP: 120/65  Pulse: 63  Weight: 165 lb (74.8 kg)   Body mass index is 23.68 kg/m.  GENERAL EXAM/CONSTITUTIONAL: Vitals:  Vitals:   01/18/16 1458  BP: 120/65  Pulse: 63  Weight: 165 lb (74.8 kg)    Patient is in no distress; well developed, nourished and groomed; neck is supple  CARDIOVASCULAR:  Examination of carotid arteries is normal; no carotid bruits  Regular rate and rhythm, no murmurs  Examination of peripheral vascular system by observation and palpation is normal  EYES:  Ophthalmoscopic exam of optic discs and posterior segments is normal; no papilledema or hemorrhages  MUSCULOSKELETAL:  Gait, strength, tone, movements noted in Neurologic exam below  NEUROLOGIC: MENTAL STATUS:   awake,  alert, oriented to person, place and time  recent and remote memory intact  normal attention and concentration  language fluent, comprehension intact, naming intact,   fund of knowledge appropriate  CRANIAL NERVE:   2nd - no papilledema on fundoscopic exam  2nd, 3rd, 4th, 6th - pupils equal and reactive to light, visual fields full to confrontation, extraocular muscles intact, no nystagmus  5th - facial sensation symmetric  7th - facial strength symmetric  8th - hearing intact  9th - palate elevates symmetrically, uvula midline  11th - shoulder shrug symmetric  12th - tongue protrusion midline  MOTOR:   normal bulk and tone, full strength in the BUE, BLE  SENSORY:   normal and symmetric to light touch, temperature; EXCEPT VIB 6 SEC AT TOES  COORDINATION:   finger-nose-finger, fine finger movements  REFLEXES:   deep tendon reflexes present and symmetric; EXCEPT ANKLES 0  GAIT/STATION:   SMOOTH STABLE GAIT    DIAGNOSTIC DATA (LABS, IMAGING, TESTING)  TSH 5.410 (h)  B12 1378  A1C 5.8   03/26/13 RIGHT HIP - COMPLETE 2+ VIEW- No fracture. Mild degenerative joint disease.  04/07/13 MRI lumbar spine (without) demonstrating:  1. At L4-5: disc bulging and facet hypertrophy with mild right and severe left foraminal stenosis  2. At L2-3, L3-4: disc bulging and facet hypertrophy with mild biforaminal foraminal stenosis  3. Disc bulging and spondylosis from L2-3 to L5-S1. Scoliosis convex to the left centered at L3  09/25/15 MRI lumbar spine [I reviewed images myself and agree with interpretation. The left L4-5 disc herniation is a new finding compared to prior MRI on 03/26/13, and is likely symptomatic. -VRP]  1. At L4-5 there is a left paracentral disc protrusion with mass effect on the left intraspinal L5 nerve root. Eccentric left broad-based disc bulge. Moderate left foraminal stenosis.  2. At L3-4 there is a mild broad-based disc bulge with moderate bilateral  facet arthropathy and mild right lateral recess stenosis.     ASSESSMENT AND PLAN 82 y.o. year old male has a past medical history of Hypertension; High cholesterol; Benign prostatic hyperplasia; Rhinitis; Intractable hiccups; Asthma; Esophagitis; and Gilbert's syndrome here with PAIN IN THE FEET.   Now with new left buttock, leg and calf  pain since Jan 2017, worsened in Mar 2017, likely due to new left L4-5 disc herniation with mass effect on left L5 root. Now improved with lumbar discectomy in May 2017.   Dx: neuropathy (small fiber, idiopathic) + left L4-5 disc herniation  Hereditary and idiopathic peripheral neuropathy  Lumbar back pain with radiculopathy affecting left lower extremity    PLAN:  I spent 15 minutes of face to face time with patient. Greater than 50% of time was spent in counseling and coordination of care with patient. In summary we discussed: - continue gabapentin to 800mg  TID for idiopathic small fiber neuropathy; may ask PCP to refill medication and then pt could follow up here as needed   Meds ordered this encounter  Medications  . gabapentin (NEURONTIN) 800 MG tablet    Sig: Take 1 tablet (800 mg total) by mouth 3 (three) times daily.    Dispense:  270 tablet    Refill:  4   Return in about 1 year (around 01/17/2017).    Suanne Marker, MD 01/18/2016, 3:13 PM Certified in Neurology, Neurophysiology and Neuroimaging  [facility] [address], [address] [place], [place] [address] [phone]

## 2016-02-01 DIAGNOSIS — R351 Nocturia: Secondary | ICD-10-CM | POA: Diagnosis not present

## 2016-02-01 DIAGNOSIS — J3089 Other allergic rhinitis: Secondary | ICD-10-CM | POA: Diagnosis not present

## 2016-02-01 DIAGNOSIS — J301 Allergic rhinitis due to pollen: Secondary | ICD-10-CM | POA: Diagnosis not present

## 2016-02-01 DIAGNOSIS — J069 Acute upper respiratory infection, unspecified: Secondary | ICD-10-CM | POA: Diagnosis not present

## 2016-02-01 DIAGNOSIS — J4530 Mild persistent asthma, uncomplicated: Secondary | ICD-10-CM | POA: Diagnosis not present

## 2016-02-16 DIAGNOSIS — J3089 Other allergic rhinitis: Secondary | ICD-10-CM | POA: Diagnosis not present

## 2016-02-16 DIAGNOSIS — J301 Allergic rhinitis due to pollen: Secondary | ICD-10-CM | POA: Diagnosis not present

## 2016-02-22 DIAGNOSIS — J301 Allergic rhinitis due to pollen: Secondary | ICD-10-CM | POA: Diagnosis not present

## 2016-02-22 DIAGNOSIS — J3089 Other allergic rhinitis: Secondary | ICD-10-CM | POA: Diagnosis not present

## 2016-02-27 DIAGNOSIS — N401 Enlarged prostate with lower urinary tract symptoms: Secondary | ICD-10-CM | POA: Diagnosis not present

## 2016-02-27 DIAGNOSIS — R351 Nocturia: Secondary | ICD-10-CM | POA: Diagnosis not present

## 2016-02-27 DIAGNOSIS — R3912 Poor urinary stream: Secondary | ICD-10-CM | POA: Diagnosis not present

## 2016-02-29 DIAGNOSIS — E782 Mixed hyperlipidemia: Secondary | ICD-10-CM | POA: Diagnosis not present

## 2016-02-29 DIAGNOSIS — E871 Hypo-osmolality and hyponatremia: Secondary | ICD-10-CM | POA: Diagnosis not present

## 2016-02-29 DIAGNOSIS — R945 Abnormal results of liver function studies: Secondary | ICD-10-CM | POA: Diagnosis not present

## 2016-03-07 DIAGNOSIS — N401 Enlarged prostate with lower urinary tract symptoms: Secondary | ICD-10-CM | POA: Diagnosis not present

## 2016-03-07 DIAGNOSIS — R351 Nocturia: Secondary | ICD-10-CM | POA: Diagnosis not present

## 2016-03-07 DIAGNOSIS — R3912 Poor urinary stream: Secondary | ICD-10-CM | POA: Diagnosis not present

## 2016-03-11 ENCOUNTER — Other Ambulatory Visit: Payer: Self-pay | Admitting: Diagnostic Neuroimaging

## 2016-03-21 DIAGNOSIS — R3912 Poor urinary stream: Secondary | ICD-10-CM | POA: Diagnosis not present

## 2016-03-21 DIAGNOSIS — N401 Enlarged prostate with lower urinary tract symptoms: Secondary | ICD-10-CM | POA: Diagnosis not present

## 2016-03-26 DIAGNOSIS — R351 Nocturia: Secondary | ICD-10-CM | POA: Diagnosis not present

## 2016-03-26 DIAGNOSIS — N401 Enlarged prostate with lower urinary tract symptoms: Secondary | ICD-10-CM | POA: Diagnosis not present

## 2016-03-26 DIAGNOSIS — R3912 Poor urinary stream: Secondary | ICD-10-CM | POA: Diagnosis not present

## 2016-03-29 DIAGNOSIS — Z23 Encounter for immunization: Secondary | ICD-10-CM | POA: Diagnosis not present

## 2016-04-03 DIAGNOSIS — R3912 Poor urinary stream: Secondary | ICD-10-CM | POA: Diagnosis not present

## 2016-04-03 DIAGNOSIS — N401 Enlarged prostate with lower urinary tract symptoms: Secondary | ICD-10-CM | POA: Diagnosis not present

## 2016-04-03 DIAGNOSIS — R351 Nocturia: Secondary | ICD-10-CM | POA: Diagnosis not present

## 2016-04-16 DIAGNOSIS — H2513 Age-related nuclear cataract, bilateral: Secondary | ICD-10-CM | POA: Diagnosis not present

## 2016-04-17 ENCOUNTER — Other Ambulatory Visit: Payer: Self-pay | Admitting: Urology

## 2016-04-18 ENCOUNTER — Other Ambulatory Visit (HOSPITAL_COMMUNITY): Payer: Self-pay | Admitting: *Deleted

## 2016-04-18 ENCOUNTER — Other Ambulatory Visit: Payer: Self-pay | Admitting: Urology

## 2016-04-18 MED ORDER — CEFAZOLIN IN D5W 1 GM/50ML IV SOLN: 1.0000 g | INTRAVENOUS | Status: DC

## 2016-04-23 DIAGNOSIS — J4530 Mild persistent asthma, uncomplicated: Secondary | ICD-10-CM | POA: Diagnosis not present

## 2016-04-23 DIAGNOSIS — R21 Rash and other nonspecific skin eruption: Secondary | ICD-10-CM | POA: Diagnosis not present

## 2016-04-23 DIAGNOSIS — J301 Allergic rhinitis due to pollen: Secondary | ICD-10-CM | POA: Diagnosis not present

## 2016-04-23 DIAGNOSIS — J3089 Other allergic rhinitis: Secondary | ICD-10-CM | POA: Diagnosis not present

## 2016-05-03 ENCOUNTER — Encounter (HOSPITAL_BASED_OUTPATIENT_CLINIC_OR_DEPARTMENT_OTHER): Payer: Self-pay | Admitting: *Deleted

## 2016-05-03 NOTE — Progress Notes (Signed)
NPO AFTER MN.  ARRIVE AT 0730.  NEEDS ISTAT AND EKG.  WILL  TAKE PRILOSEC, GABAPENTIN, NORVASC, SYNTHROID, AND DO ADVAIR INHALER AM DOS W/ SIPS OF WATER.

## 2016-05-14 ENCOUNTER — Ambulatory Visit (HOSPITAL_BASED_OUTPATIENT_CLINIC_OR_DEPARTMENT_OTHER)
Admission: RE | Admit: 2016-05-14 | Discharge: 2016-05-14 | Disposition: A | Discharge status: Home / Self Care | Payer: Medicare Other | Source: Ambulatory Visit | Attending: Urology | Admitting: Urology

## 2016-05-14 ENCOUNTER — Encounter (HOSPITAL_BASED_OUTPATIENT_CLINIC_OR_DEPARTMENT_OTHER)
Admission: RE | Disposition: A | Discharge status: Home / Self Care | Payer: Self-pay | Source: Ambulatory Visit | Attending: Urology

## 2016-05-14 ENCOUNTER — Ambulatory Visit (HOSPITAL_BASED_OUTPATIENT_CLINIC_OR_DEPARTMENT_OTHER): Payer: Medicare Other | Admitting: Anesthesiology

## 2016-05-14 ENCOUNTER — Other Ambulatory Visit: Payer: Self-pay

## 2016-05-14 ENCOUNTER — Encounter (HOSPITAL_BASED_OUTPATIENT_CLINIC_OR_DEPARTMENT_OTHER): Payer: Self-pay | Admitting: Anesthesiology

## 2016-05-14 DIAGNOSIS — K219 Gastro-esophageal reflux disease without esophagitis: Secondary | ICD-10-CM | POA: Diagnosis not present

## 2016-05-14 DIAGNOSIS — J45909 Unspecified asthma, uncomplicated: Secondary | ICD-10-CM | POA: Diagnosis not present

## 2016-05-14 DIAGNOSIS — I10 Essential (primary) hypertension: Secondary | ICD-10-CM | POA: Insufficient documentation

## 2016-05-14 DIAGNOSIS — Z87891 Personal history of nicotine dependence: Secondary | ICD-10-CM | POA: Diagnosis not present

## 2016-05-14 DIAGNOSIS — R32 Unspecified urinary incontinence: Secondary | ICD-10-CM | POA: Insufficient documentation

## 2016-05-14 DIAGNOSIS — M1990 Unspecified osteoarthritis, unspecified site: Secondary | ICD-10-CM | POA: Diagnosis not present

## 2016-05-14 DIAGNOSIS — N401 Enlarged prostate with lower urinary tract symptoms: Secondary | ICD-10-CM | POA: Diagnosis not present

## 2016-05-14 DIAGNOSIS — N320 Bladder-neck obstruction: Secondary | ICD-10-CM | POA: Diagnosis not present

## 2016-05-14 DIAGNOSIS — G629 Polyneuropathy, unspecified: Secondary | ICD-10-CM | POA: Diagnosis not present

## 2016-05-14 DIAGNOSIS — E039 Hypothyroidism, unspecified: Secondary | ICD-10-CM | POA: Insufficient documentation

## 2016-05-14 HISTORY — DX: Hypothyroidism, unspecified: E03.9

## 2016-05-14 HISTORY — DX: Personal history of colonic polyps: Z86.010

## 2016-05-14 HISTORY — DX: Personal history of other benign neoplasm: Z86.018

## 2016-05-14 HISTORY — DX: Presence of spectacles and contact lenses: Z97.3

## 2016-05-14 HISTORY — DX: Bladder-neck obstruction: N32.0

## 2016-05-14 HISTORY — DX: Mild persistent asthma, uncomplicated: J45.30

## 2016-05-14 HISTORY — DX: Gastro-esophageal reflux disease without esophagitis: K21.9

## 2016-05-14 HISTORY — DX: Personal history of adenomatous and serrated colon polyps: Z86.0101

## 2016-05-14 HISTORY — DX: Unspecified osteoarthritis, unspecified site: M19.90

## 2016-05-14 HISTORY — DX: Unspecified urinary incontinence: R32

## 2016-05-14 HISTORY — DX: Personal history of other diseases of the digestive system: Z87.19

## 2016-05-14 HISTORY — DX: Hyperlipidemia, unspecified: E78.5

## 2016-05-14 HISTORY — DX: Hereditary and idiopathic neuropathy, unspecified: G60.9

## 2016-05-14 HISTORY — DX: Presence of dental prosthetic device (complete) (partial): Z97.2

## 2016-05-14 HISTORY — DX: Unspecified symptoms and signs involving the genitourinary system: R39.9

## 2016-05-14 LAB — POCT I-STAT 4, (NA,K, GLUC, HGB,HCT)
Glucose, Bld: 95 mg/dL (ref 65–99)
HCT: 40 % (ref 39.0–52.0)
HEMOGLOBIN: 13.6 g/dL (ref 13.0–17.0)
Potassium: 4 mmol/L (ref 3.5–5.1)
Sodium: 135 mmol/L (ref 135–145)

## 2016-05-14 SURGERY — THULIUM LASER TURP (TRANSURETHRAL RESECTION OF PROSTATE)
Anesthesia: General

## 2016-05-14 MED ORDER — KETOROLAC TROMETHAMINE 30 MG/ML IJ SOLN
INTRAMUSCULAR | Status: DC | PRN
  Administered 2016-05-14: 30 mg via INTRAVENOUS

## 2016-05-14 MED ORDER — FENTANYL CITRATE (PF) 100 MCG/2ML IJ SOLN
25.0000 ug | INTRAMUSCULAR | Status: DC | PRN
  Filled 2016-05-14: qty 1

## 2016-05-14 MED ORDER — ONDANSETRON HCL 4 MG/2ML IJ SOLN
INTRAMUSCULAR | Status: DC | PRN
  Administered 2016-05-14: 4 mg via INTRAVENOUS

## 2016-05-14 MED ORDER — SULFAMETHOXAZOLE-TRIMETHOPRIM 800-160 MG PO TABS: 1.0000 | ORAL_TABLET | Freq: Two times a day (BID) | ORAL | 1 refills | Status: DC

## 2016-05-14 MED ORDER — KETOROLAC TROMETHAMINE 30 MG/ML IJ SOLN
30.0000 mg | Freq: Once | INTRAMUSCULAR | Status: DC
  Filled 2016-05-14: qty 1

## 2016-05-14 MED ORDER — ACETAMINOPHEN 325 MG PO TABS
325.0000 mg | ORAL_TABLET | ORAL | Status: DC | PRN
  Filled 2016-05-14: qty 2

## 2016-05-14 MED ORDER — EPHEDRINE SULFATE-NACL 50-0.9 MG/10ML-% IV SOSY
PREFILLED_SYRINGE | INTRAVENOUS | Status: DC | PRN
  Administered 2016-05-14 (×3): 10 mg via INTRAVENOUS

## 2016-05-14 MED ORDER — DEXAMETHASONE SODIUM PHOSPHATE 10 MG/ML IJ SOLN
INTRAMUSCULAR | Status: AC
  Filled 2016-05-14: qty 1

## 2016-05-14 MED ORDER — CEFAZOLIN SODIUM-DEXTROSE 2-4 GM/100ML-% IV SOLN
INTRAVENOUS | Status: AC
  Filled 2016-05-14: qty 100

## 2016-05-14 MED ORDER — PROPOFOL 10 MG/ML IV BOLUS
INTRAVENOUS | Status: DC | PRN
  Administered 2016-05-14: 150 mg via INTRAVENOUS
  Administered 2016-05-14: 50 mg via INTRAVENOUS

## 2016-05-14 MED ORDER — DEXAMETHASONE SODIUM PHOSPHATE 4 MG/ML IJ SOLN
INTRAMUSCULAR | Status: DC | PRN
  Administered 2016-05-14: 10 mg via INTRAVENOUS

## 2016-05-14 MED ORDER — LIDOCAINE 2% (20 MG/ML) 5 ML SYRINGE
INTRAMUSCULAR | Status: AC
  Filled 2016-05-14: qty 5

## 2016-05-14 MED ORDER — ONDANSETRON HCL 4 MG/2ML IJ SOLN
INTRAMUSCULAR | Status: AC
  Filled 2016-05-14: qty 2

## 2016-05-14 MED ORDER — EPHEDRINE 5 MG/ML INJ
INTRAVENOUS | Status: AC
  Filled 2016-05-14: qty 10

## 2016-05-14 MED ORDER — OXYCODONE HCL 5 MG/5ML PO SOLN
5.0000 mg | Freq: Once | ORAL | Status: DC | PRN
Start: 2016-05-14 — End: 2016-05-14
  Filled 2016-05-14: qty 5

## 2016-05-14 MED ORDER — MEPERIDINE HCL 25 MG/ML IJ SOLN
6.2500 mg | INTRAMUSCULAR | Status: DC | PRN
  Filled 2016-05-14: qty 1

## 2016-05-14 MED ORDER — CEFAZOLIN IN D5W 1 GM/50ML IV SOLN
1.0000 g | INTRAVENOUS | Status: DC
  Filled 2016-05-14: qty 50

## 2016-05-14 MED ORDER — MIDAZOLAM HCL 2 MG/2ML IJ SOLN
INTRAMUSCULAR | Status: AC
  Filled 2016-05-14: qty 2

## 2016-05-14 MED ORDER — ONDANSETRON HCL 4 MG/2ML IJ SOLN
4.0000 mg | Freq: Once | INTRAMUSCULAR | Status: DC | PRN
  Filled 2016-05-14: qty 2

## 2016-05-14 MED ORDER — ACETAMINOPHEN 160 MG/5ML PO SOLN
325.0000 mg | ORAL | Status: DC | PRN
  Filled 2016-05-14: qty 20.3

## 2016-05-14 MED ORDER — SODIUM CHLORIDE 0.9 % IR SOLN
Status: DC | PRN
  Administered 2016-05-14: 6000 mL

## 2016-05-14 MED ORDER — KETOROLAC TROMETHAMINE 30 MG/ML IJ SOLN
INTRAMUSCULAR | Status: AC
  Filled 2016-05-14: qty 1

## 2016-05-14 MED ORDER — CEFAZOLIN SODIUM-DEXTROSE 2-4 GM/100ML-% IV SOLN
2.0000 g | INTRAVENOUS | Status: AC
  Administered 2016-05-14: 2 g via INTRAVENOUS
  Filled 2016-05-14: qty 100

## 2016-05-14 MED ORDER — LIDOCAINE 2% (20 MG/ML) 5 ML SYRINGE
INTRAMUSCULAR | Status: DC | PRN
  Administered 2016-05-14: 70 mg via INTRAVENOUS

## 2016-05-14 MED ORDER — LACTATED RINGERS IV SOLN
INTRAVENOUS | Status: DC
  Administered 2016-05-14: 09:00:00 via INTRAVENOUS
  Filled 2016-05-14: qty 1000

## 2016-05-14 MED ORDER — FENTANYL CITRATE (PF) 100 MCG/2ML IJ SOLN
INTRAMUSCULAR | Status: AC
  Filled 2016-05-14: qty 2

## 2016-05-14 MED ORDER — OXYCODONE HCL 5 MG PO TABS
5.0000 mg | ORAL_TABLET | Freq: Once | ORAL | Status: DC | PRN
  Filled 2016-05-14: qty 1

## 2016-05-14 SURGICAL SUPPLY — 26 items
BAG DRAIN URO-CYSTO SKYTR STRL (DRAIN) ×2 IMPLANT
BAG URINE DRAINAGE (UROLOGICAL SUPPLIES) ×4 IMPLANT
CATH COUDE FOLEY 2W 5CC 18FR (CATHETERS) IMPLANT
CATH FOLEY 2WAY SLVR  5CC 18FR (CATHETERS)
CATH FOLEY 2WAY SLVR  5CC 20FR (CATHETERS) ×1
CATH FOLEY 2WAY SLVR 5CC 18FR (CATHETERS) IMPLANT
CATH FOLEY 2WAY SLVR 5CC 20FR (CATHETERS) ×1 IMPLANT
CATH FOLEY 3WAY 30CC 22F (CATHETERS) IMPLANT
CLOTH BEACON ORANGE TIMEOUT ST (SAFETY) ×2 IMPLANT
ELECT BIVAP BIPO 22/24 DONUT (ELECTROSURGICAL)
ELECT LOOP MED HF 24F 12D (CUTTING LOOP) IMPLANT
ELECTRD BIVAP BIPO 22/24 DONUT (ELECTROSURGICAL) IMPLANT
GLOVE BIO SURGEON STRL SZ7.5 (GLOVE) ×2 IMPLANT
GOWN STRL REUS W/ TWL XL LVL3 (GOWN DISPOSABLE) ×1 IMPLANT
GOWN STRL REUS W/TWL XL LVL3 (GOWN DISPOSABLE) ×1
HOLDER FOLEY CATH W/STRAP (MISCELLANEOUS) IMPLANT
IV NS IRRIG 3000ML ARTHROMATIC (IV SOLUTION) ×2 IMPLANT
IV SET EXTENSION GRAVITY 40 LF (IV SETS) ×2 IMPLANT
KIT ROOM TURNOVER WOR (KITS) ×2 IMPLANT
LASER REVOLIX PROCEDURE (MISCELLANEOUS) ×2 IMPLANT
LOOP CUT BIPOLAR 24F LRG (ELECTROSURGICAL) IMPLANT
MANIFOLD NEPTUNE II (INSTRUMENTS) IMPLANT
PACK CYSTO (CUSTOM PROCEDURE TRAY) ×2 IMPLANT
SYR 30ML LL (SYRINGE) ×2 IMPLANT
SYRINGE IRR TOOMEY STRL 70CC (SYRINGE) IMPLANT
TUBE CONNECTING 12X1/4 (SUCTIONS) ×2 IMPLANT

## 2016-05-14 NOTE — Op Note (Signed)
Pre-operative diagnosis :  Bladder neck contracture  Postoperative diagnosis:  Same  Operation:  Cystoscopy, Thulium laser of bladder neck contracture  Surgeon:  S. Patsi Searsannenbaum, MD  First assistant:  None  Anesthesia:  General LMA  Preparation:  After appropriate pre-medication, the patient was brought into the operating room and placed upon the OR table in the supine position, where he underwent general LMA anesthesia.  He was replaced in the dorsal lithotomy position. The wrist band was double -checked and the history was reviewed.   Review history:  HPI: Brent Yu is a 82 year-old male established patient who is here for urinating too frequently at night.  He usually gets up at night to urinate 4 times. The patient states they urinate a normal amount at night each time. He does not have nights when he does not get up to urinate at all. He does have trouble falling back asleep once he has been woken up at night.   He does not usually have swelling in his hands and feet during the day. He does not take a diuretic. He does not have to strain or bear down to start his urinary stream.   This gentleman initially had a favorable response to Alfuzosin began on 12/29/15 with improvement in stream and decreased urgency. He does however continue with nocturia 4 times nightly.  He reports initial/ temporary improvement with decreased nocturia, decreased frequency and urgency and decreased episodes of incontinence while taking alfuzosin.      AUA Symptom Score: Less than 50% of the time he has the sensation of not emptying his bladder completely when finished urinating. 50% of the time he has to urinate again fewer than two hours after he has finished urinating. More than 50% of the time he has to start and stop again several times when he urinates. 50% of the time he finds it difficult to postpone urination. More than 50% of the time he has a weak urinary stream. More than 50% of the time he has  to push or strain to begin urination. He has to get up to urinate 2 times from the time he goes to bed until the time he gets up in the morning.   Calculated AUA Symptom Score: 22    QOL Score: He would feel mixed if he had to live with his urinary condition the way it is now for the rest of his life.   Calculated QOL Symptom Score: 3  Statement of  Likelihood of Success: Excellent. TIME-OUT observed.:  Procedure:  Cystoscopy shows normal pendulous urethra, with resected prostatic urethra. The veru remains intact. There was a bladder neck contracture, Right > left. The bladder was normal, without trabeculation, cellules, or diverticular formation. No bladder stone or tumor was seen. The trigone was normal, with clear efflux seen from normally located ureteral orifices.    Using the Thulium laser, the Right side bladder neck contracture was released, using low energy, with minimal bleeding. A total of 26.300 joules of laser energy was used, with laser time of 6.2858minutes of laser time.    A small amount of bleeding was controlled with the laser, and the bladder was drained of fluid. A 31F 2-way foley catheter was placed, with  clear urine obtained.  The foley was placed to straight drainage. The patient was awakened and taken to the recovery room in good condition.

## 2016-05-14 NOTE — Interval H&P Note (Signed)
History and Physical Interval Note:  05/14/2016 9:06 AM  Brent Yu  has presented today for surgery, with the diagnosis of BLADDER NECK CONTRACTURE  The various methods of treatment have been discussed with the patient and family. After consideration of risks, benefits and other options for treatment, the patient has consented to  Procedure(s): CYSTOSCOPY LASER OF PROSTATE AND BLADDER NECK THULIUM LASER TURP (TRANSURETHRAL RESECTION OF PROSTATE) (N/A) as a surgical intervention .  The patient's history has been reviewed, patient examined, no change in status, stable for surgery.  I have reviewed the patient's chart and labs.  Questions were answered to the patient's satisfaction.     Lydian Chavous I Aashna Matson

## 2016-05-14 NOTE — Anesthesia Postprocedure Evaluation (Signed)
Anesthesia Post Note  Patient: Brent Yu  Procedure(s) Performed: Procedure(s) (LRB): CYSTOSCOPY LASER OF PROSTATE AND BLADDER NECK THULIUM LASER TURP (TRANSURETHRAL RESECTION OF PROSTATE) (N/A)  Patient location during evaluation: PACU Anesthesia Type: General Level of consciousness: awake Pain management: pain level controlled Vital Signs Assessment: post-procedure vital signs reviewed and stable Respiratory status: spontaneous breathing Cardiovascular status: stable Postop Assessment: no signs of nausea or vomiting Anesthetic complications: no     Last Vitals:  Vitals:   05/14/16 1015 05/14/16 1030  BP: 132/79 127/75  Pulse: 66 66  Resp: 12 12  Temp: 36.2 C     Last Pain:  Vitals:   05/14/16 0820  TempSrc:   PainSc: 2    Pain Goal: Patients Stated Pain Goal: 5 (05/14/16 0820)               Saje Gallop JR,JOHN Susann GivensFRANKLIN

## 2016-05-14 NOTE — Interval H&P Note (Signed)
History and Physical Interval Note:  05/14/2016 9:05 AM  Brent Yu  has presented today for surgery, with the diagnosis of BLADDER NECK CONTRACTURE  The various methods of treatment have been discussed with the patient and family. After consideration of risks, benefits and other options for treatment, the patient has consented to  Procedure(s): CYSTOSCOPY LASER OF PROSTATE AND BLADDER NECK THULIUM LASER TURP (TRANSURETHRAL RESECTION OF PROSTATE) (N/A) as a surgical intervention .  The patient's history has been reviewed, patient examined, no change in status, stable for surgery.  I have reviewed the patient's chart and labs.  Questions were answered to the patient's satisfaction.     Donya Hitch I Elizabeht Suto

## 2016-05-14 NOTE — H&P (Signed)
Office Visit Report     04/03/2016   --------------------------------------------------------------------------------   Brent GuthrieGerald L. Yu  MRN: [id number]  PRIMARY CARE:  Anna GenreKevin L. Little, MD  DOB: 01/02/1933, 82 year old Male  REFERRING:  Brent Fonda I. Patsi Searsannenbaum, MD  [id number]  PROVIDER:  Anne FuLarry Yu    TREATING:  Brent BolusSigmund Chelsea Yu, M.D.    LOCATION:  [facility] [phone]   --------------------------------------------------------------------------------   CC: I get up too often at night to urinate.  HPI: Brent ConnGerald Yu is a 82 year-old male established patient who is here for urinating too frequently at night.  He usually gets up at night to urinate 4 times. The patient states they urinate a normal amount at night each time. He does not have nights when he does not get up to urinate at all. He does have trouble falling back asleep once he has been woken up at night.   He does not usually have swelling in his hands and feet during the day. He does not take a diuretic. He does not have to strain or bear down to start his urinary stream.   This gentleman initially had a favorable response to Alfuzosin began on 12/29/15 with improvement in stream and decreased urgency. He does however continue with nocturia 4 times nightly.      CC: AUA Questions Scoring.  HPI:   patient reports improvement with decreased nocturia, decreased frequency and urgency and decreased episodes of incontinence while taking alfuzosin. IPS score sheet to/70.     AUA Symptom Score: Less than 50% of the time he has the sensation of not emptying his bladder completely when finished urinating. 50% of the time he has to urinate again fewer than two hours after he has finished urinating. More than 50% of the time he has to start and stop again several times when he urinates. 50% of the time he finds it difficult to postpone urination. More than 50% of the time he has a weak urinary stream. More than 50% of  the time he has to push or strain to begin urination. He has to get up to urinate 2 times from the time he goes to bed until the time he gets up in the morning.   Calculated AUA Symptom Score: 22    QOL Score: He would feel mixed if he had to live with his urinary condition the way it is now for the rest of his life.   Calculated QOL Symptom Score: 3    ALLERGIES: No Known Drug Allergies    MEDICATIONS: Prilosec  Uroxatral 10 mg tablet, extended release 24 hr 1 tablet PO Q HS  Advair Diskus 100-50 MCG/DOSE Inhalation Aerosol Powder Breath Activated Inhalation  AmLODIPine Besylate 5 MG Oral Tablet Oral  Centrum Silver TABS Oral  CETIRIZINE HCL PO PRN  Fish Oil CAPS Oral  Flonase SUSP Nasal  Gabapentin 800 mg tablet  Levothyroxine Sodium 25 MCG Oral Tablet Oral  ProAir HFA 108 (90 Base) MCG/ACT Inhalation Aerosol Solution Inhalation  PROBIOTIC Daily  Telmisartan 80 mg tablet  Viagra TABS 0 Oral  Zantac 75 TABS Oral     GU PSH: Complex cystometrogram, w/ void pressure and urethral pressure profile studies, any technique - 03/26/2016 Complex Uroflow - 03/26/2016 Cystoscopy - 03/07/2016 Emg surf Electrd - 03/26/2016 Inject For cystogram - 03/26/2016 Intrabd voidng Press - 03/26/2016      PSH Notes: Decompression Tarsal Tunnel Release Right Foot, Decompression Tarsal Tunnel Release Left Foot, Foot Surgery, Leg Repair, Gastric Surgery,  Inguinal Hernia Repair, Transurethral Resection Of Prostate   NON-GU PSH: Remove Tonsils - about 1942    GU PMH: Nocturia - 03/07/2016, - 8/16/2017Nocturia, Nocturia - 2014 Weak Urinary Stream - 02/27/2016 BPH w/LUTS - 11/30/2015, BPH w/LUTS, Benign localized prostatic hyperplasia with lower urinary tract symptoms (LUTS) - 10/06/2014, Benign prostatic hyperplasia with urinary obstruction, - 2014 Balanitis, Balanitis - 2014 BPH w/o LUTS, Benign Prostatic Hypertrophy - 2014 Dysuria, Dysuria - 2014 Gross hematuria, Gross hematuria - 2014      PMH Notes:   2006-12-02 12:15:13 - Note: Hiccups (On Exam)   NON-GU PMH: Encounter for general adult medical examination without abnormal findings, Encounter for preventive health examination - 10/06/2014 Allergy status to unspecified drugs, medicaments and biological substances status, Allergies - 2014 Asthma, Asthma - 2014 Chronic rhinitis, Rhinitis - 2014 Male erectile disorder, Impotence, psychogenic - 2014 Personal history of other diseases of the circulatory system, History of hypertension - 2014 Personal history of other diseases of the respiratory system, History of allergic rhinitis - 2014 Personal history of other endocrine, nutritional and metabolic disease, History of hypercholesterolemia - 2014, History of hypothyroidism, - 2014    FAMILY HISTORY: Death - Father, Mother Family Health Status Number - Runs In Family Kidney Cancer - Brother liver cancer - Sister Prostate Cancer - Runs In Family Stroke Syndrome - Runs In Family   SOCIAL HISTORY: Marital Status: Married Current Smoking Status: Patient does not smoke anymore. Has not smoked since 11/16/1960.  Has never drank.  Does not drink caffeine. Patient's occupation is/was Retired.    REVIEW OF SYSTEMS:    GU Review Male:   Patient reports frequent urination, hard to postpone urination, get up at night to urinate, leakage of urine, and stream starts and stops. Patient denies burning/ pain with urination, trouble starting your stream, have to strain to urinate , erection problems, and penile pain.  Gastrointestinal (Upper):   Patient denies nausea, vomiting, and indigestion/ heartburn.  Gastrointestinal (Lower):   Patient denies diarrhea and constipation.  Constitutional:   Patient reports night sweats. Patient denies fever, weight loss, and fatigue.  Skin:   Patient denies skin rash/ lesion and itching.  Eyes:   Patient denies blurred vision and double vision.  Ears/ Nose/ Throat:   Patient denies sore throat and sinus problems.   Hematologic/Lymphatic:   Patient reports easy bruising. Patient denies swollen glands.  Cardiovascular:   Patient denies leg swelling and chest pains.  Respiratory:   Patient denies cough and shortness of breath.  Endocrine:   Patient denies excessive thirst.  Musculoskeletal:   Patient denies back pain and joint pain.  Neurological:   Patient denies headaches and dizziness.  Psychologic:   Patient denies depression and anxiety.   VITAL SIGNS:      04/03/2016 04:38 PM  Height 70 in / 177.8 cm  BP 162/83 mmHg  Pulse 67 /min  Temperature 97.8 F / 37 C   GU PHYSICAL EXAMINATION:    Anus and Perineum: No hemorrhoids. No anal stenosis. No rectal fissure, no anal fissure. No edema, no dimple, no perineal tenderness, no anal tenderness.  Scrotum: No lesions. No edema. No cysts. No warts.  Epididymides: Right: no spermatocele, no masses, no cysts, no tenderness, no induration, no enlargement. Left: no spermatocele, no masses, no cysts, no tenderness, no induration, no enlargement.  Testes: No tenderness, no swelling, no enlargement left testes. No tenderness, no swelling, no enlargement right testes. Normal location left testes. Normal location right testes. No mass, no cyst,  no varicocele, no hydrocele left testes. No mass, no cyst, no varicocele, no hydrocele right testes.  Urethral Meatus: Normal size. No lesion, no wart, no discharge, no polyp. Normal location.  Penis: Penis uncircumcised. No foreskin warts, no cracks. No dorsal peyronie's plaques, no left corporal peyronie's plaques, no right corporal peyronie's plaques, no scarring, no shaft warts. No balanitis, no meatal stenosis.   Prostate: Prostate 2 + size. Left lobe normal consistency, right lobe normal consistency. Symmetrical lobes. No prostate nodule. Left lobe no tenderness, right lobe no tenderness.   Seminal Vesicles: Nonpalpable.  Sphincter Tone: Normal sphincter. No rectal tenderness. No rectal mass.    MULTI-SYSTEM PHYSICAL  EXAMINATION:    Constitutional: Well-nourished. No physical deformities. Normally developed. Good grooming.  Respiratory: No labored breathing, no use of accessory muscles.   Cardiovascular: Normal temperature, normal extremity pulses, no swelling, no varicosities.  Lymphatic: No enlargement of neck, axillae, groin.  Skin: No paleness, no jaundice, no cyanosis. No lesion, no ulcer, no rash.  Neurologic / Psychiatric: Oriented to time, oriented to place, oriented to person. No depression, no anxiety, no agitation.  Gastrointestinal: No mass, no tenderness, no rigidity, non obese abdomen.  Musculoskeletal: Spine, ribs, pelvis no bilateral tenderness. Normal gait and station of head and neck.     PAST DATA REVIEWED:  Source Of History:  Patient  Records Review:   Previous Patient Records  Urine Test Review:   Urinalysis  Urodynamics Review:   Review Urodynamics Tests   10/04/11 10/20/10 10/18/09 07/14/08 06/30/07 10/04/05 04/03/04 03/03/03  PSA  Total PSA 0.64  0.73  0.83  0.72  0.58  0.65  0.47  0.31     04/03/16  Urinalysis  Urine Appearance Clear   Urine Specimen Voided   Urine Color Yellow   Urine Glucose Neg   Urine Bilirubin Neg   Urine Ketones Neg   Urine Specific Gravity 1.015   Urine Blood Trace   Urine pH 5.0   Urine Protein Trace   Urine Urobilinogen 0.2   Urine Nitrites Neg   Urine Leukocyte Esterase Neg   Urine WBC/hpf 0 - 5/hpf   Urine RBC/hpf 0 - 2/hpf   Urine Epithelial Cells 0 - 5/hpf   Urine Bacteria Rare   Urine Mucous Not Present   Urine Yeast NS (Not Seen)   Urine Trichomonas Not Present   Urine Cystals NS (Not Seen)   Urine Casts NS (Not Seen)   Urine Sperm Not Present    PROCEDURES:          Urinalysis w/Scope - 81001 Dipstick Dipstick Cont'd Micro  Specimen: Voided Bilirubin: Neg WBC/hpf: 0 - 5/hpf  Color: Yellow Ketones: Neg RBC/hpf: 0 - 2/hpf  Appearance: Clear Blood: Trace Bacteria: Rare  Specific Gravity: 1.015 Protein: Trace Cystals: NS  (Not Seen)  pH: 5.0 Urobilinogen: 0.2 Casts: NS (Not Seen)  Glucose: Neg Nitrites: Neg Trichomonas: Not Present    Leukocyte Esterase: Neg Mucous: Not Present      Epithelial Cells: 0 - 5/hpf      Yeast: NS (Not Seen)      Sperm: Not Present    ASSESSMENT:      ICD-10 Details  1 GU:   Nocturia - R35.1   2   BPH w/LUTS - N40.1   3   Weak Urinary Stream - R39.12           Notes:   82 year old male returns today to review urodynamic results. Hx of complaining of urinary incontinence, with a prostate  symptom score sheet of 22 (normal less than 10). He has improved symptomatically on alfuzosin, 10mg /day, but the symptom score she is still 20/7.Marland Kitchen   Cystoscopy shows mild bladder neck contracture. Video urodynamics is accomplished on 03/26/16 to evaluate for obstruction versus neurogenic component of his bladder.   The maximum cystometric capacity is 420 cc. Normal desire to void occurs at 161 cc and strong desire occurs at 1 or 96 cc. The patient develops small unstable contractions pressure of 7 cm of water pressure with no urinary leakage. Valsalva leak point pressure is accomplished, and shows that the patient does not leak at the maximum abdominal pressure generation of 92 cm of water pressure.   Pressure flow study is accomplished, and shows a maximum flow rate of 12 cc/s, with detrusor pressure at maximum flow 35 cm of water pressure, to 38 cm of water pressure. PVRs are proximate 65 cc. This is performed with the patient standing. He has a poorly sustained bladder contraction   The patient has a voluntary contraction, which is poorly sustained, and he is only able to void a small amount while seated. While standing, however, he is able to complete his void. He generates a stronger contraction, but it is still not well sustained. He voids with an interrupted flow, without post void residual 65 cc. There is no reflux.   Hx of having incontinence, with an IPSS score  of 22. He has significant  bladder outlet symptoms. Cystoscopy shows a normal bladder, although he has a mild bladder neck contracture.  He will undergo Thulium laser vaporization of bladder neck contracture, and an effort to open up his bladder. This should help his apparent obstruction found on urodynamics.      PLAN:           Schedule Return Visit: Next Available Appointment - Schedule Surgery          Document Letter(s):  Created for Patient: Clinical Summary    Signed by Brent Yu, M.D. on 04/03/16 at 5:26 PM (EDT)     The information contained in this medical record document is considered private and confidential patient information. This information can only be used for the medical diagnosis and/or medical services that are being provided by the patient's selected caregivers. This information can only be distributed outside of the patient's care if the patient agrees and signs waivers of authorization for this information to be sent to an outside source or route.

## 2016-05-14 NOTE — Anesthesia Preprocedure Evaluation (Addendum)
Anesthesia Evaluation  Patient identified by MRN, date of birth, ID band Patient awake    Reviewed: Allergy & Precautions, NPO status , Patient's Chart, lab work & pertinent test results  Airway Mallampati: I   Neck ROM: Limited    Dental  (+) Partial Upper, Poor Dentition, Dental Advisory Given   Pulmonary asthma , former smoker,    Pulmonary exam normal        Cardiovascular hypertension, Pt. on medications Normal cardiovascular exam Rhythm:Regular Rate:Normal     Neuro/Psych    GI/Hepatic GERD  Medicated and Controlled,  Endo/Other  Hypothyroidism   Renal/GU      Musculoskeletal  (+) Arthritis , Osteoarthritis,    Abdominal Normal abdominal exam  (+)   Peds  Hematology   Anesthesia Other Findings   Reproductive/Obstetrics                           Anesthesia Physical Anesthesia Plan  ASA: II  Anesthesia Plan: General   Post-op Pain Management:    Induction: Intravenous  Airway Management Planned: LMA  Additional Equipment:   Intra-op Plan:   Post-operative Plan:   Informed Consent: I have reviewed the patients History and Physical, chart, labs and discussed the procedure including the risks, benefits and alternatives for the proposed anesthesia with the patient or authorized representative who has indicated his/her understanding and acceptance.     Plan Discussed with: CRNA and Surgeon  Anesthesia Plan Comments: (Proseal LMA)        Anesthesia Quick Evaluation

## 2016-05-14 NOTE — Discharge Instructions (Addendum)
Transurethral Resection of the Prostate, Care After °Introduction °Refer to this sheet in the next few weeks. These instructions provide you with information about caring for yourself after your procedure. Your health care provider may also give you more specific instructions. Your treatment has been planned according to current medical practices, but problems sometimes occur. Call your health care provider if you have any problems or questions after your procedure. °What can I expect after the procedure? °After the procedure, it is common to have: °· Mild pain in your lower abdomen. °· Soreness or mild discomfort in your penis from having the catheter inserted during the procedure. °· A feeling of urgency when you need to urinate. °· A small amount of blood in your urine. You may notice some small blood clots in your urine. These are normal. °Follow these instructions at home: °Medicines °· Take over-the-counter and prescription medicines only as told by your health care provider. °· Do not drive or operate heavy machinery while taking prescription pain medicine. °· Do not drive for 24 hours if you received a sedative. °· If you were prescribed antibiotic medicine, take it as told by your health care provider. Do not stop taking the antibiotic even if you start to feel better. °Activity °· Return to your normal activities as told by your health care provider. Ask your health care provider what activities are safe for you. °· Do not lift anything that is heavier than 10 lb (4.5 kg) for 3 weeks after your procedure, or as long as told by your health care provider. °· Avoid intense physical activity for as long as told by your health care provider. °· Walk at least one time every day. This helps to prevent blood clots. You may increase your physical activity gradually as you start to feel better. °Lifestyle °· Do not drink alcohol for as long as told by your health care provider. This is especially important if you  are taking prescription pain medicines. °· Do not engage in sexual activity until your health care provider says that you can do this. °General instructions °· Do not take baths, swim, or use a hot tub until your health care provider approves. °· Drink enough fluid to keep your urine clear or pale yellow. °· Urinate as soon as you feel the need to. Do not try to hold your urine for long periods of time. °· If your health care provider approves, you may take a stool softener for 2-3 weeks to prevent you from straining to have a bowel movement. °· Wear compression stockings as told by your health care provider. These stockings help to prevent blood clots and reduce swelling in your legs. °· Keep all follow-up visits as told by your health care provider. This is important. °Contact a health care provider if: °· You have difficulty urinating. °· You have a fever. °· You have pain that gets worse or does not improve with medicine. °· You have blood in your urine that does not go away after 1 week of resting and drinking more fluids. °· You have swelling in your penis or testicles. °Get help right away if: °· You are unable to urinate. °· You are having more blood clots in your urine instead of fewer. °· You have: °¨ Large blood clots. °¨ A lot of blood in your urine. °¨ Pain in your back or lower abdomen. °¨ Pain or swelling in your legs. °¨ Chills and you are shaking. °This information is not intended to replace   advice given to you by your health care provider. Make sure you discuss any questions you have with your health care provider. °Document Released: 06/04/2005 Document Revised: 02/05/2016 Document Reviewed: 02/24/2015 °© 2017 Elsevier ° °Post Anesthesia Home Care Instructions ° °Activity: °Get plenty of rest for the remainder of the day. A responsible adult should stay with you for 24 hours following the procedure.  °For the next 24 hours, DO NOT: °-Drive a car °-Operate machinery °-Drink alcoholic  beverages °-Take any medication unless instructed by your physician °-Make any legal decisions or sign important papers. ° °Meals: °Start with liquid foods such as gelatin or soup. Progress to regular foods as tolerated. Avoid greasy, spicy, heavy foods. If nausea and/or vomiting occur, drink only clear liquids until the nausea and/or vomiting subsides. Call your physician if vomiting continues. ° °Special Instructions/Symptoms: °Your throat may feel dry or sore from the anesthesia or the breathing tube placed in your throat during surgery. If this causes discomfort, gargle with warm salt water. The discomfort should disappear within 24 hours. ° °If you had a scopolamine patch placed behind your ear for the management of post- operative nausea and/or vomiting: ° °1. The medication in the patch is effective for 72 hours, after which it should be removed.  Wrap patch in a tissue and discard in the trash. Wash hands thoroughly with soap and water. °2. You may remove the patch earlier than 72 hours if you experience unpleasant side effects which may include dry mouth, dizziness or visual disturbances. °3. Avoid touching the patch. Wash your hands with soap and water after contact with the patch. °  ° °

## 2016-05-14 NOTE — Anesthesia Procedure Notes (Signed)
Procedure Name: LMA Insertion Date/Time: 05/14/2016 9:19 AM Performed by: Tyrone NineSAUVE, Trayton Szabo F Pre-anesthesia Checklist: Patient identified, Timeout performed, Emergency Drugs available, Suction available and Patient being monitored Patient Re-evaluated:Patient Re-evaluated prior to inductionOxygen Delivery Method: Circle system utilized Preoxygenation: Pre-oxygenation with 100% oxygen Intubation Type: IV induction Ventilation: Mask ventilation without difficulty LMA: LMA inserted LMA Size: 4.0 Number of attempts: 2 Placement Confirmation: positive ETCO2 and breath sounds checked- equal and bilateral Tube secured with: Tape Dental Injury: Teeth and Oropharynx as per pre-operative assessment

## 2016-05-14 NOTE — Transfer of Care (Signed)
Immediate Anesthesia Transfer of Care Note  Patient: Brent Yu  Procedure(s) Performed: Procedure(s): CYSTOSCOPY LASER OF PROSTATE AND BLADDER NECK THULIUM LASER TURP (TRANSURETHRAL RESECTION OF PROSTATE) (N/A)  Patient Location: PACU  Anesthesia Type:General  Level of Consciousness: awake, alert , oriented and patient cooperative  Airway & Oxygen Therapy: Patient Spontanous Breathing and Patient connected to nasal cannula oxygen  Post-op Assessment: Report given to RN and Post -op Vital signs reviewed and stable  Post vital signs: Reviewed and stable  Last Vitals:  Vitals:   05/14/16 0758  BP: 134/74  Pulse: 68  Resp: 16  Temp: 36.3 C    Last Pain:  Vitals:   05/14/16 0820  TempSrc:   PainSc: 2       Patients Stated Pain Goal: 5 (05/14/16 0820)  Complications: No apparent anesthesia complications

## 2016-07-30 DIAGNOSIS — J3089 Other allergic rhinitis: Secondary | ICD-10-CM | POA: Diagnosis not present

## 2016-07-30 DIAGNOSIS — J4530 Mild persistent asthma, uncomplicated: Secondary | ICD-10-CM | POA: Diagnosis not present

## 2016-07-30 DIAGNOSIS — J209 Acute bronchitis, unspecified: Secondary | ICD-10-CM | POA: Diagnosis not present

## 2016-07-30 DIAGNOSIS — J301 Allergic rhinitis due to pollen: Secondary | ICD-10-CM | POA: Diagnosis not present

## 2016-08-21 DIAGNOSIS — J3089 Other allergic rhinitis: Secondary | ICD-10-CM | POA: Diagnosis not present

## 2016-08-21 DIAGNOSIS — J4530 Mild persistent asthma, uncomplicated: Secondary | ICD-10-CM | POA: Diagnosis not present

## 2016-08-21 DIAGNOSIS — J301 Allergic rhinitis due to pollen: Secondary | ICD-10-CM | POA: Diagnosis not present

## 2016-08-21 DIAGNOSIS — R21 Rash and other nonspecific skin eruption: Secondary | ICD-10-CM | POA: Diagnosis not present

## 2016-09-10 DIAGNOSIS — N401 Enlarged prostate with lower urinary tract symptoms: Secondary | ICD-10-CM | POA: Diagnosis not present

## 2016-09-10 DIAGNOSIS — R351 Nocturia: Secondary | ICD-10-CM | POA: Diagnosis not present

## 2016-09-10 DIAGNOSIS — R350 Frequency of micturition: Secondary | ICD-10-CM | POA: Diagnosis not present

## 2016-10-08 DIAGNOSIS — L308 Other specified dermatitis: Secondary | ICD-10-CM | POA: Diagnosis not present

## 2016-10-08 DIAGNOSIS — Z1283 Encounter for screening for malignant neoplasm of skin: Secondary | ICD-10-CM | POA: Diagnosis not present

## 2016-10-08 DIAGNOSIS — X32XXXD Exposure to sunlight, subsequent encounter: Secondary | ICD-10-CM | POA: Diagnosis not present

## 2016-10-08 DIAGNOSIS — L570 Actinic keratosis: Secondary | ICD-10-CM | POA: Diagnosis not present

## 2016-10-11 DIAGNOSIS — Z0000 Encounter for general adult medical examination without abnormal findings: Secondary | ICD-10-CM | POA: Diagnosis not present

## 2016-10-11 DIAGNOSIS — K219 Gastro-esophageal reflux disease without esophagitis: Secondary | ICD-10-CM | POA: Diagnosis not present

## 2016-10-11 DIAGNOSIS — E039 Hypothyroidism, unspecified: Secondary | ICD-10-CM | POA: Diagnosis not present

## 2016-10-11 DIAGNOSIS — I10 Essential (primary) hypertension: Secondary | ICD-10-CM | POA: Diagnosis not present

## 2016-11-26 DIAGNOSIS — R21 Rash and other nonspecific skin eruption: Secondary | ICD-10-CM | POA: Diagnosis not present

## 2016-11-26 DIAGNOSIS — J301 Allergic rhinitis due to pollen: Secondary | ICD-10-CM | POA: Diagnosis not present

## 2016-11-26 DIAGNOSIS — J3089 Other allergic rhinitis: Secondary | ICD-10-CM | POA: Diagnosis not present

## 2016-11-26 DIAGNOSIS — J4530 Mild persistent asthma, uncomplicated: Secondary | ICD-10-CM | POA: Diagnosis not present

## 2016-11-28 DIAGNOSIS — K4090 Unilateral inguinal hernia, without obstruction or gangrene, not specified as recurrent: Secondary | ICD-10-CM | POA: Diagnosis not present

## 2016-12-25 NOTE — Progress Notes (Signed)
EKG 05-14-16 epic

## 2016-12-25 NOTE — Progress Notes (Signed)
Patient has pre-op appt on 12-26-16. Please place orders in epic! Thank you!

## 2016-12-25 NOTE — Patient Instructions (Addendum)
Yaakov GuthrieGerald L Mario  12/25/2016   Your procedure is scheduled on: 01-04-17  [facility]  [facility] [place]  elevators to 3rd floor to  Short Stay Center at [facility].   Call this number if you have problems the morning of surgery [phone]    Remember: ONLY 1 PERSON MAY GO WITH YOU TO SHORT STAY TO GET  READY MORNING OF YOUR SURGERY.  Do not eat food or drink liquids :After Midnight.     Take these medicines the morning of surgery with A SIP OF WATER: levothyroxine(synthroid), gabapentin, omeprazole(prilosec), alfuzosin(uroxatral), nasal spray as needed, inhalers as needed (may bring to the hospital)                                 You may not have any metal on your body including hair pins and              piercings  Do not wear jewelry, make-up, lotions, powders or perfumes, deodorant                   Men may shave face and neck.   Do not bring valuables to the hospital. [facility] IS NOT             RESPONSIBLE   FOR VALUABLES.  Contacts, dentures or bridgework may not be worn into surgery.     Patients discharged the day of surgery will not be allowed to drive home.  Name and phone number of your driver:  Special Instructions: N/A              Please read over the following fact sheets you were given: _____________________________________________________________________           [facility] Pre[facility] Before surgery, you can play an important role.  Because skin is not sterile, your skin needs to be as free of germs as possible.  You can reduce the number of germs on your skin by washing with CHG (chlorahexidine gluconate) soap before surgery.  CHG is an antiseptic cleaner which kills germs and bonds with the skin to continue killing germs even after washing. Please DO NOT use if you have an allergy to CHG or antibacterial soaps.  If your skin becomes reddened/irritated stop using the CHG and inform your nurse when you arrive at  Short Stay. Do not shave (including legs and underarms) for at least 48 hours prior to the first CHG shower.  You may shave your face/neck. Please follow these instructions carefully:  1.  Shower with CHG Soap the night before surgery and the  morning of Surgery.  2.  If you choose to wash your hair, wash your hair first as usual with your  normal  shampoo.  3.  After you shampoo, rinse your hair and body thoroughly to remove the  shampoo.                           4.  Use CHG as you would any other liquid soap.  You can apply chg directly  to the skin and wash                       Gently with a scrungie or clean washcloth.  5.  Apply  the CHG Soap to your body ONLY FROM THE NECK DOWN.   Do not use on face/ open                           Wound or open sores. Avoid contact with eyes, ears mouth and genitals (private parts).                       Wash face,  Genitals (private parts) with your normal soap.             6.  Wash thoroughly, paying special attention to the area where your surgery  will be performed.  7.  Thoroughly rinse your body with warm water from the neck down.  8.  DO NOT shower/wash with your normal soap after using and rinsing off  the CHG Soap.                9.  Pat yourself dry with a clean towel.            10.  Wear clean pajamas.            11.  Place clean sheets on your bed the night of your first shower and do not  sleep with pets. Day of Surgery : Do not apply any lotions/deodorants the morning of surgery.  Please wear clean clothes to the hospital/surgery center.  FAILURE TO FOLLOW THESE INSTRUCTIONS MAY RESULT IN THE CANCELLATION OF YOUR SURGERY PATIENT SIGNATURE_________________________________  NURSE SIGNATURE__________________________________  ________________________________________________________________________

## 2016-12-26 ENCOUNTER — Encounter (HOSPITAL_COMMUNITY): Payer: Self-pay

## 2016-12-26 ENCOUNTER — Encounter (HOSPITAL_COMMUNITY)
Admission: RE | Admit: 2016-12-26 | Discharge: 2016-12-26 | Disposition: A | Discharge status: Home / Self Care | Payer: Medicare Other | Source: Ambulatory Visit | Attending: Surgery | Admitting: Surgery

## 2016-12-26 DIAGNOSIS — Z01812 Encounter for preprocedural laboratory examination: Secondary | ICD-10-CM | POA: Diagnosis not present

## 2016-12-26 HISTORY — DX: Adverse effect of unspecified anesthetic, initial encounter: T41.45XA

## 2016-12-26 LAB — CBC
HCT: 36.9 % — ABNORMAL LOW (ref 39.0–52.0)
Hemoglobin: 12.9 g/dL — ABNORMAL LOW (ref 13.0–17.0)
MCH: 31.7 pg (ref 26.0–34.0)
MCHC: 35 g/dL (ref 30.0–36.0)
MCV: 90.7 fL (ref 78.0–100.0)
Platelets: 196 10*3/uL (ref 150–400)
RBC: 4.07 MIL/uL — ABNORMAL LOW (ref 4.22–5.81)
RDW: 12.2 % (ref 11.5–15.5)
WBC: 3.9 10*3/uL — ABNORMAL LOW (ref 4.0–10.5)

## 2016-12-26 LAB — BASIC METABOLIC PANEL
ANION GAP: 7 (ref 5–15)
BUN: 21 mg/dL — ABNORMAL HIGH (ref 6–20)
CALCIUM: 9 mg/dL (ref 8.9–10.3)
CHLORIDE: 94 mmol/L — AB (ref 101–111)
CO2: 30 mmol/L (ref 22–32)
Creatinine, Ser: 1 mg/dL (ref 0.61–1.24)
GFR calc non Af Amer: >60 mL/min (ref >60)
GLUCOSE: 90 mg/dL (ref 65–99)
Potassium: 4.7 mmol/L (ref 3.5–5.1)
Sodium: 131 mmol/L — ABNORMAL LOW (ref 135–145)

## 2017-01-02 ENCOUNTER — Ambulatory Visit: Payer: Self-pay | Admitting: Surgery

## 2017-01-02 NOTE — H&P (Signed)
Chief Complaint:  RIH  History of Present Illness:  Brent Yu is an 83 y.o. male who was seen by Dr. Ezzard Standing for me for a right inguinal hernia.   The PCP is Dr. Kirtland Bouchard. Little Over the last couple of months, he has noticed some discomfort in his right groin. This is particularly true when he lifts. He has noticed a bulge in the right groin consistent with a probable inguinal hernia. He saw Dr. Catha Gosselin who referred him to our office. He's had a prior left inguinal hernia by Dr. Daphine Deutscher around 2000. I do not have the operative note. Dr. Daphine Deutscher also removed a benign tumor from his stomach in 1992.  I spoke with Dr. Daphine Deutscher on the phone. I gave the patient a book on hernia repairs. I will set the patient up for an open right inguinal hernia repair by Dr. Daphine Deutscher. The patient see Dr. Daphine Deutscher about a week before surgery to review my notes and review the patient's physical exam.  The patient was seen by me on Wednesday, [place] and his questions were answered regarding the surgery.  Ready for open RIH with mesh.     Past Medical History:  Diagnosis Date  . Arthritis    HANDS  . Benign prostatic hyperplasia   . Bladder neck contracture   . Complication of anesthesia 1992   after 1992 tumor removal from stomach, my blood pressure went up high in the recovery  but ive had surgeries since then with no problems  . GERD (gastroesophageal reflux disease)   . Gilbert's syndrome    no significant health problems related to this issue   . Hereditary and idiopathic peripheral neuropathy   . History of adenomatous polyp of colon    2010 tubular adenoma/  2015 hyperplastic  . History of benign gastric tumor    resection leiomyoma  . History of esophagitis   . Hyperlipidemia   . Hypertension   . Hypothyroidism   . Lower urinary tract symptoms (LUTS)   . Mild persistent asthma   . Urinary incontinence   . Wears glasses   . Wears partial dentures    UPPER    Past Surgical History:   Procedure Laterality Date  . CYSTO/  BILATERAL RETROGRADE PYELOGRAM/  BLADDER BX'S WITH FULGERATION  03/23/2005  . GASTRIC RESECTION  1992   benign leiomyoma tumor  . INGUINAL HERNIA REPAIR Left 2000  . LUMBAR LAMINECTOMY  11/09/2015  . REPAIR SUBLUXING TENDON WITH GASTROC SLIDE Right 2012   ankle  . TARSAL TUNNEL RELEASE Bilateral right 08-17-2011/  left 09-11-2011  . TONSILLECTOMY    . TRANSURETHRAL RESECTION OF PROSTATE  03/25/2003  . TURP VAPORIZATION  1999   w/ Indigo laser    No current facility-administered medications for this encounter.    Current Outpatient Prescriptions  Medication Sig Dispense Refill  . albuterol (PROVENTIL HFA;VENTOLIN HFA) 108 (90 BASE) MCG/ACT inhaler Inhale 1-2 puffs into the lungs every 6 (six) hours as needed for wheezing or shortness of breath.     . alfuzosin (UROXATRAL) 10 MG 24 hr tablet Take 10 mg by mouth daily with breakfast.   6  . amLODipine (NORVASC) 5 MG tablet Take 5 mg by mouth every evening.     . fish oil-omega-3 fatty acids 1000 MG capsule Take 1 g by mouth daily.     . fluticasone (FLONASE) 50 MCG/ACT nasal spray Place 1 spray into both nostrils 2 (two) times daily.    . Fluticasone-Salmeterol (ADVAIR) 100-50  MCG/DOSE AEPB Inhale 1 puff into the lungs 2 (two) times daily.     Marland Kitchen. gabapentin (NEURONTIN) 800 MG tablet Take 1 tablet (800 mg total) by mouth 3 (three) times daily. 270 tablet 4  . ibuprofen (ADVIL,MOTRIN) 200 MG tablet Take 200 mg by mouth daily as needed for headache or moderate pain.     Marland Kitchen. levocetirizine (XYZAL) 5 MG tablet Take 5 mg by mouth daily.     Marland Kitchen. levothyroxine (SYNTHROID, LEVOTHROID) 25 MCG tablet Take 25 mcg by mouth daily before breakfast.    . mometasone (ELOCON) 0.1 % ointment Apply 1 application topically daily as needed (rash).    . montelukast (SINGULAIR) 10 MG tablet Take 10 mg by mouth at bedtime.    . Multiple Vitamins-Minerals (CENTRUM SILVER PO) Take 1 tablet by mouth daily.    Marland Kitchen. omeprazole (PRILOSEC  OTC) 20 MG tablet Take 20 mg by mouth daily.    . Probiotic CAPS Take 1 capsule by mouth daily.    . ranitidine (ZANTAC) 75 MG tablet Take 75 mg by mouth at bedtime.     . sildenafil (VIAGRA) 100 MG tablet Take 100 mg by mouth as needed for erectile dysfunction.     Marland Kitchen. telmisartan (MICARDIS) 80 MG tablet Take 80 mg by mouth every morning.   1   Lisinopril Family History  Problem Relation Age of Onset  . Cancer Mother   . Stroke Father    Social History:   reports that he quit smoking about 54 years ago. He quit after 10.00 years of use. He has never used smokeless tobacco. He reports that he does not drink alcohol or use drugs.   REVIEW OF SYSTEMS : Negative except for he has some urinary incontinence since bladder surgery by Dr. Marcello Fennelannebaum last year.  Physical Exam:   There were no vitals taken for this visit. There is no height or weight on file to calculate BMI.  Gen:  WDWN WM NAD  Neurological: Alert and oriented to person, place, and time. Motor and sensory function is grossly intact  Head: Normocephalic and atraumatic.  Eyes: Conjunctivae are normal. Pupils are equal, round, and reactive to light. No scleral icterus.  Neck: Normal range of motion. Neck supple. No tracheal deviation or thyromegaly present.  Cardiovascular:  SR without murmurs or gallops.  No carotid bruits Breast:  Not examined Respiratory: Effort normal.  No respiratory distress. No chest wall tenderness. Breath sounds normal.  No wheezes, rales or rhonchi.  Abdomen:  nontender GU:  Prominent right inguinal hernia that is reducible Musculoskeletal: Normal range of motion. Extremities are nontender. No cyanosis, edema or clubbing noted Lymphadenopathy: No cervical, preauricular, postauricular or axillary adenopathy is present Skin: Skin is warm and dry. No rash noted. No diaphoresis. No erythema. No pallor. Pscyh: Normal mood and affect. Behavior is normal. Judgment and thought content normal.   LABORATORY  RESULTS: No results found for this or any previous visit (from the past 48 hour(s)).   RADIOLOGY RESULTS: No results found.  Problem List: Patient Active Problem List   Diagnosis Date Noted  . Hereditary and idiopathic peripheral neuropathy 12/31/2014  . Unspecified hypothyroidism 03/26/2013  . Neuropathy 10/08/2012    Assessment & Plan: Right inguinal hernia  Plan right inguinal hernia repair with mesh-open    Matt B. Daphine DeutscherMartin, MD, [facility]  [facility] [phone] beeper [phone]  01/02/2017 9:30 AM

## 2017-01-03 DIAGNOSIS — B353 Tinea pedis: Secondary | ICD-10-CM | POA: Diagnosis not present

## 2017-01-03 DIAGNOSIS — M79674 Pain in right toe(s): Secondary | ICD-10-CM | POA: Diagnosis not present

## 2017-01-03 DIAGNOSIS — M79675 Pain in left toe(s): Secondary | ICD-10-CM | POA: Diagnosis not present

## 2017-01-04 ENCOUNTER — Ambulatory Visit (HOSPITAL_COMMUNITY): Payer: Medicare Other | Admitting: Certified Registered"

## 2017-01-04 ENCOUNTER — Ambulatory Visit (HOSPITAL_COMMUNITY)
Admission: RE | Admit: 2017-01-04 | Discharge: 2017-01-04 | Disposition: A | Discharge status: Home / Self Care | Payer: Medicare Other | Source: Ambulatory Visit | Attending: Surgery | Admitting: Surgery

## 2017-01-04 ENCOUNTER — Encounter (HOSPITAL_COMMUNITY): Payer: Self-pay | Admitting: *Deleted

## 2017-01-04 ENCOUNTER — Encounter (HOSPITAL_COMMUNITY)
Admission: RE | Disposition: A | Discharge status: Home / Self Care | Payer: Self-pay | Source: Ambulatory Visit | Attending: Surgery

## 2017-01-04 DIAGNOSIS — Z9889 Other specified postprocedural states: Secondary | ICD-10-CM | POA: Diagnosis not present

## 2017-01-04 DIAGNOSIS — I10 Essential (primary) hypertension: Secondary | ICD-10-CM | POA: Diagnosis not present

## 2017-01-04 DIAGNOSIS — M19041 Primary osteoarthritis, right hand: Secondary | ICD-10-CM | POA: Insufficient documentation

## 2017-01-04 DIAGNOSIS — N320 Bladder-neck obstruction: Secondary | ICD-10-CM | POA: Diagnosis not present

## 2017-01-04 DIAGNOSIS — K219 Gastro-esophageal reflux disease without esophagitis: Secondary | ICD-10-CM | POA: Insufficient documentation

## 2017-01-04 DIAGNOSIS — N400 Enlarged prostate without lower urinary tract symptoms: Secondary | ICD-10-CM | POA: Insufficient documentation

## 2017-01-04 DIAGNOSIS — Z87891 Personal history of nicotine dependence: Secondary | ICD-10-CM | POA: Insufficient documentation

## 2017-01-04 DIAGNOSIS — M19042 Primary osteoarthritis, left hand: Secondary | ICD-10-CM | POA: Insufficient documentation

## 2017-01-04 DIAGNOSIS — E785 Hyperlipidemia, unspecified: Secondary | ICD-10-CM | POA: Insufficient documentation

## 2017-01-04 DIAGNOSIS — J4530 Mild persistent asthma, uncomplicated: Secondary | ICD-10-CM | POA: Diagnosis not present

## 2017-01-04 DIAGNOSIS — Z823 Family history of stroke: Secondary | ICD-10-CM | POA: Insufficient documentation

## 2017-01-04 DIAGNOSIS — Z86010 Personal history of colonic polyps: Secondary | ICD-10-CM | POA: Insufficient documentation

## 2017-01-04 DIAGNOSIS — E039 Hypothyroidism, unspecified: Secondary | ICD-10-CM | POA: Diagnosis not present

## 2017-01-04 DIAGNOSIS — Z79899 Other long term (current) drug therapy: Secondary | ICD-10-CM | POA: Insufficient documentation

## 2017-01-04 DIAGNOSIS — K4090 Unilateral inguinal hernia, without obstruction or gangrene, not specified as recurrent: Secondary | ICD-10-CM | POA: Insufficient documentation

## 2017-01-04 DIAGNOSIS — Z809 Family history of malignant neoplasm, unspecified: Secondary | ICD-10-CM | POA: Insufficient documentation

## 2017-01-04 DIAGNOSIS — G629 Polyneuropathy, unspecified: Secondary | ICD-10-CM | POA: Diagnosis not present

## 2017-01-04 DIAGNOSIS — G8918 Other acute postprocedural pain: Secondary | ICD-10-CM | POA: Diagnosis not present

## 2017-01-04 DIAGNOSIS — G609 Hereditary and idiopathic neuropathy, unspecified: Secondary | ICD-10-CM | POA: Insufficient documentation

## 2017-01-04 HISTORY — PX: INGUINAL HERNIA REPAIR: SHX194

## 2017-01-04 SURGERY — REPAIR, HERNIA, INGUINAL, ADULT
Anesthesia: Regional | Site: Groin | Laterality: Right

## 2017-01-04 MED ORDER — CELECOXIB 200 MG PO CAPS
400.0000 mg | ORAL_CAPSULE | ORAL | Status: AC
  Administered 2017-01-04: 400 mg via ORAL
  Filled 2017-01-04: qty 2

## 2017-01-04 MED ORDER — BUPIVACAINE-EPINEPHRINE 0.25% -1:200000 IJ SOLN
INTRAMUSCULAR | Status: AC
Start: 2017-01-04 — End: ?
  Filled 2017-01-04: qty 1

## 2017-01-04 MED ORDER — TRAMADOL HCL 50 MG PO TABS: 50.0000 mg | ORAL_TABLET | Freq: Four times a day (QID) | ORAL | 1 refills | Status: DC | PRN

## 2017-01-04 MED ORDER — PHENYLEPHRINE HCL 10 MG/ML IJ SOLN
INTRAMUSCULAR | Status: DC | PRN
  Administered 2017-01-04: 120 ug via INTRAVENOUS
  Administered 2017-01-04: 200 ug via INTRAVENOUS
  Administered 2017-01-04 (×2): 80 ug via INTRAVENOUS
  Administered 2017-01-04: 120 ug via INTRAVENOUS

## 2017-01-04 MED ORDER — PHENYLEPHRINE 40 MCG/ML (10ML) SYRINGE FOR IV PUSH (FOR BLOOD PRESSURE SUPPORT)
PREFILLED_SYRINGE | INTRAVENOUS | Status: AC
  Filled 2017-01-04: qty 10

## 2017-01-04 MED ORDER — ACETAMINOPHEN 325 MG PO TABS: 650.0000 mg | ORAL_TABLET | ORAL | Status: DC | PRN

## 2017-01-04 MED ORDER — CHLORHEXIDINE GLUCONATE CLOTH 2 % EX PADS: 6.0000 | MEDICATED_PAD | Freq: Once | CUTANEOUS | Status: DC

## 2017-01-04 MED ORDER — PROPOFOL 10 MG/ML IV BOLUS
INTRAVENOUS | Status: DC | PRN
  Administered 2017-01-04: 150 mg via INTRAVENOUS
  Administered 2017-01-04: 10 mg via INTRAVENOUS

## 2017-01-04 MED ORDER — SODIUM CHLORIDE 0.9% FLUSH: 3.0000 mL | INTRAVENOUS | Status: DC | PRN

## 2017-01-04 MED ORDER — HEPARIN SODIUM (PORCINE) 5000 UNIT/ML IJ SOLN
5000.0000 [IU] | Freq: Once | INTRAMUSCULAR | Status: AC
  Administered 2017-01-04: 5000 [IU] via SUBCUTANEOUS
  Filled 2017-01-04: qty 1

## 2017-01-04 MED ORDER — MIDAZOLAM HCL 2 MG/2ML IJ SOLN
INTRAMUSCULAR | Status: DC | PRN
  Administered 2017-01-04: 2 mg via INTRAVENOUS

## 2017-01-04 MED ORDER — SODIUM CHLORIDE 0.9% FLUSH: 3.0000 mL | Freq: Two times a day (BID) | INTRAVENOUS | Status: DC

## 2017-01-04 MED ORDER — PROPOFOL 10 MG/ML IV BOLUS
INTRAVENOUS | Status: AC
  Filled 2017-01-04: qty 20

## 2017-01-04 MED ORDER — MIDAZOLAM HCL 2 MG/2ML IJ SOLN
INTRAMUSCULAR | Status: AC
  Filled 2017-01-04: qty 2

## 2017-01-04 MED ORDER — FENTANYL CITRATE (PF) 100 MCG/2ML IJ SOLN
100.0000 ug | Freq: Once | INTRAMUSCULAR | Status: AC
  Administered 2017-01-04: 50 ug via INTRAVENOUS

## 2017-01-04 MED ORDER — SODIUM CHLORIDE 0.9 % IV SOLN
250.0000 mL | INTRAVENOUS | Status: DC | PRN
Start: 2017-01-04 — End: 2017-01-04

## 2017-01-04 MED ORDER — ONDANSETRON HCL 4 MG/2ML IJ SOLN
INTRAMUSCULAR | Status: DC | PRN
  Administered 2017-01-04: 4 mg via INTRAVENOUS

## 2017-01-04 MED ORDER — EPHEDRINE 5 MG/ML INJ
INTRAVENOUS | Status: AC
  Filled 2017-01-04: qty 10

## 2017-01-04 MED ORDER — OXYCODONE HCL 5 MG PO TABS
5.0000 mg | ORAL_TABLET | ORAL | Status: DC | PRN
  Administered 2017-01-04: 5 mg via ORAL
  Filled 2017-01-04: qty 1

## 2017-01-04 MED ORDER — ONDANSETRON HCL 4 MG/2ML IJ SOLN: 4.0000 mg | Freq: Once | INTRAMUSCULAR | Status: DC | PRN

## 2017-01-04 MED ORDER — CEFAZOLIN SODIUM-DEXTROSE 2-4 GM/100ML-% IV SOLN
2.0000 g | INTRAVENOUS | Status: AC
  Administered 2017-01-04: 2 g via INTRAVENOUS
  Filled 2017-01-04: qty 100

## 2017-01-04 MED ORDER — FENTANYL CITRATE (PF) 100 MCG/2ML IJ SOLN
INTRAMUSCULAR | Status: AC
  Administered 2017-01-04: 50 ug via INTRAVENOUS
  Filled 2017-01-04: qty 2

## 2017-01-04 MED ORDER — EPHEDRINE SULFATE 50 MG/ML IJ SOLN
INTRAMUSCULAR | Status: DC | PRN
  Administered 2017-01-04: 5 mg via INTRAVENOUS

## 2017-01-04 MED ORDER — SODIUM CHLORIDE 0.9 % IV SOLN: 250.0000 mL | INTRAVENOUS | Status: DC | PRN

## 2017-01-04 MED ORDER — LIDOCAINE HCL (CARDIAC) 20 MG/ML IV SOLN
INTRAVENOUS | Status: DC | PRN
  Administered 2017-01-04: 25 mg via INTRATRACHEAL

## 2017-01-04 MED ORDER — ACETAMINOPHEN 650 MG RE SUPP: 650.0000 mg | RECTAL | Status: DC | PRN

## 2017-01-04 MED ORDER — BUPIVACAINE LIPOSOME 1.3 % IJ SUSP
20.0000 mL | Freq: Once | INTRAMUSCULAR | Status: AC
  Administered 2017-01-04: 20 mL
  Filled 2017-01-04: qty 20

## 2017-01-04 MED ORDER — FENTANYL CITRATE (PF) 100 MCG/2ML IJ SOLN: 25.0000 ug | INTRAMUSCULAR | Status: DC | PRN

## 2017-01-04 MED ORDER — ROPIVACAINE HCL 5 MG/ML IJ SOLN
INTRAMUSCULAR | Status: DC | PRN
  Administered 2017-01-04: 20 mL via PERINEURAL

## 2017-01-04 MED ORDER — SODIUM CHLORIDE 0.9 % IR SOLN
Status: DC | PRN
  Administered 2017-01-04: 1000 mL

## 2017-01-04 MED ORDER — ACETAMINOPHEN 500 MG PO TABS
1000.0000 mg | ORAL_TABLET | ORAL | Status: DC
  Filled 2017-01-04: qty 2

## 2017-01-04 MED ORDER — LACTATED RINGERS IV SOLN
INTRAVENOUS | Status: DC
  Administered 2017-01-04 (×2): via INTRAVENOUS

## 2017-01-04 MED ORDER — FENTANYL CITRATE (PF) 250 MCG/5ML IJ SOLN
INTRAMUSCULAR | Status: DC | PRN
  Administered 2017-01-04 (×2): 25 ug via INTRAVENOUS

## 2017-01-04 MED ORDER — DEXAMETHASONE SODIUM PHOSPHATE 10 MG/ML IJ SOLN
INTRAMUSCULAR | Status: DC | PRN
  Administered 2017-01-04: 10 mg via INTRAVENOUS

## 2017-01-04 MED ORDER — FENTANYL CITRATE (PF) 250 MCG/5ML IJ SOLN
INTRAMUSCULAR | Status: AC
  Filled 2017-01-04: qty 5

## 2017-01-04 MED ORDER — GABAPENTIN 300 MG PO CAPS
300.0000 mg | ORAL_CAPSULE | ORAL | Status: DC
  Filled 2017-01-04: qty 1

## 2017-01-04 SURGICAL SUPPLY — 30 items
BLADE SURG 15 STRL LF DISP TIS (BLADE) ×1 IMPLANT
BLADE SURG 15 STRL SS (BLADE) ×1
COVER SURGICAL LIGHT HANDLE (MISCELLANEOUS) ×2 IMPLANT
DECANTER SPIKE VIAL GLASS SM (MISCELLANEOUS) ×2 IMPLANT
DERMABOND ADVANCED (GAUZE/BANDAGES/DRESSINGS) ×1
DERMABOND ADVANCED .7 DNX12 (GAUZE/BANDAGES/DRESSINGS) ×1 IMPLANT
DISSECTOR ROUND CHERRY 3/8 STR (MISCELLANEOUS) IMPLANT
DRAIN PENROSE 18X1/2 LTX STRL (DRAIN) ×2 IMPLANT
DRAPE LAPAROTOMY TRNSV 102X78 (DRAPE) ×2 IMPLANT
ELECT PENCIL ROCKER SW 15FT (MISCELLANEOUS) ×2 IMPLANT
ELECT REM PT RETURN 15FT ADLT (MISCELLANEOUS) ×2 IMPLANT
GLOVE BIOGEL M 8.0 STRL (GLOVE) ×2 IMPLANT
GOWN STRL REUS W/TWL XL LVL3 (GOWN DISPOSABLE) ×4 IMPLANT
KIT BASIN OR (CUSTOM PROCEDURE TRAY) ×2 IMPLANT
MESH HERNIA 3X6 (Mesh General) ×2 IMPLANT
NEEDLE HYPO 22GX1.5 SAFETY (NEEDLE) ×2 IMPLANT
PACK BASIC VI WITH GOWN DISP (CUSTOM PROCEDURE TRAY) ×2 IMPLANT
SPONGE LAP 4X18 X RAY DECT (DISPOSABLE) ×2 IMPLANT
STAPLER VISISTAT 35W (STAPLE) IMPLANT
SUT PROLENE 2 0 CT2 30 (SUTURE) ×4 IMPLANT
SUT SILK 2 0 SH (SUTURE) IMPLANT
SUT VIC AB 2-0 SH 27 (SUTURE) ×1
SUT VIC AB 2-0 SH 27X BRD (SUTURE) ×1 IMPLANT
SUT VIC AB 4-0 SH 18 (SUTURE) ×2 IMPLANT
SUT VICRYL 4-0 (SUTURE) ×2 IMPLANT
SYR 20CC LL (SYRINGE) ×2 IMPLANT
SYR BULB IRRIGATION 50ML (SYRINGE) ×2 IMPLANT
TOWEL OR 17X26 10 PK STRL BLUE (TOWEL DISPOSABLE) ×2 IMPLANT
TOWEL OR NON WOVEN STRL DISP B (DISPOSABLE) ×2 IMPLANT
YANKAUER SUCT BULB TIP 10FT TU (MISCELLANEOUS) ×2 IMPLANT

## 2017-01-04 NOTE — Progress Notes (Signed)
Assisted Dr. Ellender with right, ultrasound guided, transabdominal plane block. Side rails up, monitors on throughout procedure. See vital signs in flow sheet. Tolerated Procedure well. ?

## 2017-01-04 NOTE — Discharge Instructions (Signed)
Open Hernia Repair, Adult, Care After °This sheet gives you information about how to care for yourself after your procedure. Your health care provider may also give you more specific instructions. If you have problems or questions, contact your health care provider. °What can I expect after the procedure? °After the procedure, it is common to have: °· Mild discomfort. °· Slight bruising. °· Minor swelling. °· Pain in the abdomen. ° °Follow these instructions at home: °Incision care ° °· Follow instructions from your health care provider about how to take care of your incision area. Make sure you: °? Wash your hands with soap and water before you change your bandage (dressing). If soap and water are not available, use hand sanitizer. °? Change your dressing as told by your health care provider. °? Leave stitches (sutures), skin glue, or adhesive strips in place. These skin closures may need to stay in place for 2 weeks or longer. If adhesive strip edges start to loosen and curl up, you may trim the loose edges. Do not remove adhesive strips completely unless your health care provider tells you to do that. °· Check your incision area every day for signs of infection. Check for: °? More redness, swelling, or pain. °? More fluid or blood. °? Warmth. °? Pus or a bad smell. °Activity °· Do not drive or use heavy machinery while taking prescription pain medicine. Do not drive until your health care provider approves. °· Until your health care provider approves: °? Do not lift anything that is heavier than 10 lb (4.5 kg). °? Do not play contact sports. °· Return to your normal activities as told by your health care provider. Ask your health care provider what activities are safe. °General instructions °· To prevent or treat constipation while you are taking prescription pain medicine, your health care provider may recommend that you: °? Drink enough fluid to keep your urine clear or pale yellow. °? Take over-the-counter or  prescription medicines. °? Eat foods that are high in fiber, such as fresh fruits and vegetables, whole grains, and beans. °? Limit foods that are high in fat and processed sugars, such as fried and sweet foods. °· Take over-the-counter and prescription medicines only as told by your health care provider. °· Do not take tub baths or go swimming until your health care provider approves. °· Keep all follow-up visits as told by your health care provider. This is important. °Contact a health care provider if: °· You develop a rash. °· You have more redness, swelling, or pain around your incision. °· You have more fluid or blood coming from your incision. °· Your incision feels warm to the touch. °· You have pus or a bad smell coming from your incision. °· You have a fever or chills. °· You have blood in your stool (feces). °· You have not had a bowel movement in 2-3 days. °· Your pain is not controlled with medicine. °Get help right away if: °· You have chest pain or shortness of breath. °· You feel light-headed or feel faint. °· You have severe pain. °· You vomit and your pain is worse. °This information is not intended to replace advice given to you by your health care provider. Make sure you discuss any questions you have with your health care provider. °Document Released: 12/22/2004 Document Revised: 12/23/2015 Document Reviewed: 11/16/2015 °Elsevier Interactive Patient Education © 2017 [employer] Inc[employer] ° ° °General Anesthesia, Adult, Care After °These instructions provide you with information about caring for yourself   after your procedure. Your health care provider may also give you more specific instructions. Your treatment has been planned according to current medical practices, but problems sometimes occur. Call your health care provider if you have any problems or questions after your procedure. °What can I expect after the procedure? °After the procedure, it is common to have: °· Vomiting. °· A sore  throat. °· Mental slowness. ° °It is common to feel: °· Nauseous. °· Cold or shivery. °· Sleepy. °· Tired. °· Sore or achy, even in parts of your body where you did not have surgery. ° °Follow these instructions at home: °For at least 24 hours after the procedure: °· Do not: °? Participate in activities where you could fall or become injured. °? Drive. °? Use heavy machinery. °? Drink alcohol. °? Take sleeping pills or medicines that cause drowsiness. °? Make important decisions or sign legal documents. °? Take care of children on your own. °· Rest. °Eating and drinking °· If you vomit, drink water, juice, or soup when you can drink without vomiting. °· Drink enough fluid to keep your urine clear or pale yellow. °· Make sure you have little or no nausea before eating solid foods. °· Follow the diet recommended by your health care provider. °General instructions °· Have a responsible adult stay with you until you are awake and alert. °· Return to your normal activities as told by your health care provider. Ask your health care provider what activities are safe for you. °· Take over-the-counter and prescription medicines only as told by your health care provider. °· If you smoke, do not smoke without supervision. °· Keep all follow-up visits as told by your health care provider. This is important. °Contact a health care provider if: °· You continue to have nausea or vomiting at home, and medicines are not helpful. °· You cannot drink fluids or start eating again. °· You cannot urinate after 8-12 hours. °· You develop a skin rash. °· You have fever. °· You have increasing redness at the site of your procedure. °Get help right away if: °· You have difficulty breathing. °· You have chest pain. °· You have unexpected bleeding. °· You feel that you are having a life-threatening or urgent problem. °This information is not intended to replace advice given to you by your health care provider. Make sure you discuss any  questions you have with your health care provider. °Document Released: 09/10/2000 Document Revised: 11/07/2015 Document Reviewed: 05/19/2015 °Elsevier Interactive Patient Education © 2018 [employer] Inc. ° °

## 2017-01-04 NOTE — Anesthesia Procedure Notes (Signed)
Procedure Name: LMA Insertion Date/Time: 01/04/2017 11:08 AM Performed by: Donna BernardRIMBLE, Maudean Hoffmann H Pre-anesthesia Checklist: Patient identified Patient Re-evaluated:Patient Re-evaluated prior to induction Oxygen Delivery Method: Circle system utilized Preoxygenation: Pre-oxygenation with 100% oxygen Induction Type: IV induction Ventilation: Mask ventilation without difficulty LMA: LMA inserted LMA Size: 5.0 Number of attempts: 1 Tube secured with: Tape

## 2017-01-04 NOTE — Transfer of Care (Signed)
Immediate Anesthesia Transfer of Care Note  Patient: Brent Yu  Procedure(s) Performed: Procedure(s): OPEN RIGHT INGUINAL HERNIA REPAIR (Right)  Patient Location: PACU  Anesthesia Type:General  Level of Consciousness: sedated and patient cooperative  Airway & Oxygen Therapy: Patient connected to face mask oxygen  Post-op Assessment: Report given to RN and Post -op Vital signs reviewed and stable  Post vital signs: stable  Last Vitals:  Vitals:   01/04/17 1036 01/04/17 1039  BP:  (!) 145/76  Pulse: 60 61  Resp: 12 18  Temp:      Last Pain:  Vitals:   01/04/17 0905  TempSrc: Oral      Patients Stated Pain Goal: 3 (01/04/17 [id number])  Complications: No apparent anesthesia complications

## 2017-01-04 NOTE — Anesthesia Procedure Notes (Signed)
Anesthesia Regional Block: TAP block   Pre-Anesthetic Checklist: ,, timeout performed, Correct Patient, Correct Site, Correct Laterality, Correct Procedure,, site marked, risks and benefits discussed, Surgical consent,  Pre-op evaluation,  At surgeon's request and post-op pain management  Laterality: Right  Prep: chloraprep       Needles:  Injection technique: Single-shot  Needle Type: Echogenic Stimulator Needle     Needle Length: 9cm  Needle Gauge: 21     Additional Needles:   Procedures: ultrasound guided,,,,,,,,  Narrative:  Start time: 01/04/2017 10:25 AM End time: 01/04/2017 10:35 AM Injection made incrementally with aspirations every 5 mL.  Performed by: Personally  Anesthesiologist: Karna ChristmasELLENDER, Taneisha Fuson P  Additional Notes: Functioning IV was confirmed and monitors were applied.  A 90mm 21ga Arrow echogenic stimulator needle was used. Sterile prep,hand hygiene and sterile gloves were used.  Negative aspiration and negative test dose prior to incremental administration of local anesthetic. The patient tolerated the procedure well.

## 2017-01-04 NOTE — Anesthesia Postprocedure Evaluation (Signed)
Anesthesia Post Note  Patient: Brent Yu  Procedure(s) Performed: Procedure(s) (LRB): OPEN RIGHT INGUINAL HERNIA REPAIR (Right)     Patient location during evaluation: PACU Anesthesia Type: Regional and General Level of consciousness: awake and alert Pain management: pain level controlled Vital Signs Assessment: post-procedure vital signs reviewed and stable Respiratory status: spontaneous breathing, nonlabored ventilation, respiratory function stable and patient connected to nasal cannula oxygen Cardiovascular status: blood pressure returned to baseline and stable Postop Assessment: no signs of nausea or vomiting Anesthetic complications: no    Last Vitals:  Vitals:   01/04/17 1324 01/04/17 1336  BP: 132/78 131/75  Pulse: 64 66  Resp: 15 11  Temp: (!) 36.3 C 36.4 C    Last Pain:  Vitals:   01/04/17 1336  TempSrc:   PainSc: 3                  Ashyah Quizon P Sharifa Bucholz

## 2017-01-04 NOTE — Interval H&P Note (Signed)
History and Physical Interval Note:  01/04/2017 10:44 AM  Sampson GoonGerald L Gick  has presented today for surgery, with the diagnosis of right inguinal hernia  The various methods of treatment have been discussed with the patient and family. After consideration of risks, benefits and other options for treatment, the patient has consented to  Procedure(s): OPEN RIGHT INGUINAL HERNIA REPAIR (Right) as a surgical intervention .  The patient's history has been reviewed, patient examined, no change in status, stable for surgery.  I have reviewed the patient's chart and labs.  Questions were answered to the patient's satisfaction.     Saul Fabiano B

## 2017-01-04 NOTE — Op Note (Signed)
Surgeon: Wenda LowMatt Kelseigh Diver, MD, FACS  Asst:  none  Anes:  general  Preop Dx: Right inguinal hernia Postop Dx: Open right direct inguinal hernia  Procedure: Open right direct inguinal hernia repair with mesh Location Surgery: [facility] OR 1 Complications: none  EBL:   minimal cc  Drains: none  Description of Procedure:  The patient was taken to OR 1 .  After anesthesia was administered and the patient was prepped a timeout was performed.  An oblique incision was made in the right groin;  Superficial vein was divided and tied with 4-0 vicryl.  External oblique was exposed and opened to the internal ring.  Nerve retracted superiorly.  Cord mobilized and a large direct inguinal hernia was mobilized and tucked in and closed with a running 2-0 prolene.  No indirect hernia was seen.  The floor was repaired with a piece of marlex type mesh that was cut to fit and sewn in place with 2-0 prolene running along the inguinal ligament and medially along the internal oblique muscle.  It was placed around the cord and sutured to itself laterally.  The external oblique was closed with a running 2-0 vicryl.  The region was infiltrated with Exparel 20 cc and closed in layers with 4-0 vicryl and Dermabond.    The patient tolerated the procedure well and was taken to the PACU in stable condition.     Matt B. Daphine DeutscherMartin, MD, [facility], [place] [phone]

## 2017-01-04 NOTE — Anesthesia Preprocedure Evaluation (Addendum)
Anesthesia Evaluation  Patient identified by MRN, date of birth, ID band Patient awake    Reviewed: Allergy & Precautions, NPO status , Patient's Chart, lab work & pertinent test results  Airway Mallampati: I   Neck ROM: Limited    Dental  (+) Partial Upper, Poor Dentition, Dental Advisory Given   Pulmonary asthma (controlled) , former smoker,    Pulmonary exam normal        Cardiovascular hypertension, Pt. on medications Normal cardiovascular exam Rhythm:Regular Rate:Normal  ECG: NSR, rate 63   Neuro/Psych    GI/Hepatic Medicated and Controlled,  Endo/Other  Hypothyroidism   Renal/GU      Musculoskeletal  (+) Arthritis , Osteoarthritis,    Abdominal Normal abdominal exam  (+)   Peds  Hematology  (+) anemia ,   Anesthesia Other Findings Hyperlipidemia  Reproductive/Obstetrics                            Anesthesia Physical  Anesthesia Plan  ASA: II  Anesthesia Plan: General and Regional   Post-op Pain Management: GA combined w/ Regional for post-op pain   Induction: Intravenous  PONV Risk Score and Plan: 2 and Ondansetron, Dexamethasone and Propofol  Airway Management Planned: LMA  Additional Equipment:   Intra-op Plan:   Post-operative Plan:   Informed Consent: I have reviewed the patients History and Physical, chart, labs and discussed the procedure including the risks, benefits and alternatives for the proposed anesthesia with the patient or authorized representative who has indicated his/her understanding and acceptance.     Plan Discussed with: CRNA and Surgeon  Anesthesia Plan Comments:         Anesthesia Quick Evaluation

## 2017-01-07 ENCOUNTER — Encounter (HOSPITAL_COMMUNITY): Payer: Self-pay | Admitting: Surgery

## 2017-01-07 NOTE — Addendum Note (Signed)
Addendum  created 01/07/17 0626 by Elyn PeersAllen, Vickey Ewbank J, CRNA   Charge Capture section accepted

## 2017-01-23 ENCOUNTER — Ambulatory Visit: Payer: Medicare Other | Admitting: Diagnostic Neuroimaging

## 2017-03-15 ENCOUNTER — Encounter (HOSPITAL_COMMUNITY): Payer: Self-pay | Admitting: *Deleted

## 2017-03-19 DIAGNOSIS — Z23 Encounter for immunization: Secondary | ICD-10-CM | POA: Diagnosis not present

## 2017-05-13 DIAGNOSIS — H179 Unspecified corneal scar and opacity: Secondary | ICD-10-CM | POA: Diagnosis not present

## 2017-05-13 DIAGNOSIS — H2513 Age-related nuclear cataract, bilateral: Secondary | ICD-10-CM | POA: Diagnosis not present

## 2017-06-28 DIAGNOSIS — R198 Other specified symptoms and signs involving the digestive system and abdomen: Secondary | ICD-10-CM | POA: Diagnosis not present

## 2017-06-28 DIAGNOSIS — Z86010 Personal history of colonic polyps: Secondary | ICD-10-CM | POA: Diagnosis not present

## 2017-06-28 DIAGNOSIS — K210 Gastro-esophageal reflux disease with esophagitis: Secondary | ICD-10-CM | POA: Diagnosis not present

## 2017-07-11 DIAGNOSIS — R198 Other specified symptoms and signs involving the digestive system and abdomen: Secondary | ICD-10-CM | POA: Diagnosis not present

## 2017-08-02 DIAGNOSIS — R198 Other specified symptoms and signs involving the digestive system and abdomen: Secondary | ICD-10-CM | POA: Diagnosis not present

## 2017-09-11 DIAGNOSIS — R350 Frequency of micturition: Secondary | ICD-10-CM | POA: Diagnosis not present

## 2017-09-11 DIAGNOSIS — N401 Enlarged prostate with lower urinary tract symptoms: Secondary | ICD-10-CM | POA: Diagnosis not present

## 2017-09-11 DIAGNOSIS — R351 Nocturia: Secondary | ICD-10-CM | POA: Diagnosis not present

## 2017-09-19 DIAGNOSIS — M79674 Pain in right toe(s): Secondary | ICD-10-CM | POA: Diagnosis not present

## 2017-09-19 DIAGNOSIS — B351 Tinea unguium: Secondary | ICD-10-CM | POA: Diagnosis not present

## 2017-09-19 DIAGNOSIS — M79675 Pain in left toe(s): Secondary | ICD-10-CM | POA: Diagnosis not present

## 2017-10-10 DIAGNOSIS — L570 Actinic keratosis: Secondary | ICD-10-CM | POA: Diagnosis not present

## 2017-10-10 DIAGNOSIS — X32XXXD Exposure to sunlight, subsequent encounter: Secondary | ICD-10-CM | POA: Diagnosis not present

## 2017-10-16 DIAGNOSIS — I10 Essential (primary) hypertension: Secondary | ICD-10-CM | POA: Diagnosis not present

## 2017-10-16 DIAGNOSIS — Z0000 Encounter for general adult medical examination without abnormal findings: Secondary | ICD-10-CM | POA: Diagnosis not present

## 2017-10-16 DIAGNOSIS — E782 Mixed hyperlipidemia: Secondary | ICD-10-CM | POA: Diagnosis not present

## 2017-10-16 DIAGNOSIS — E039 Hypothyroidism, unspecified: Secondary | ICD-10-CM | POA: Diagnosis not present

## 2017-10-17 DIAGNOSIS — E039 Hypothyroidism, unspecified: Secondary | ICD-10-CM | POA: Diagnosis not present

## 2017-10-17 DIAGNOSIS — Z0000 Encounter for general adult medical examination without abnormal findings: Secondary | ICD-10-CM | POA: Diagnosis not present

## 2017-10-17 DIAGNOSIS — I10 Essential (primary) hypertension: Secondary | ICD-10-CM | POA: Diagnosis not present

## 2017-10-17 DIAGNOSIS — E782 Mixed hyperlipidemia: Secondary | ICD-10-CM | POA: Diagnosis not present

## 2017-11-25 DIAGNOSIS — J4530 Mild persistent asthma, uncomplicated: Secondary | ICD-10-CM | POA: Diagnosis not present

## 2017-11-25 DIAGNOSIS — J301 Allergic rhinitis due to pollen: Secondary | ICD-10-CM | POA: Diagnosis not present

## 2017-11-25 DIAGNOSIS — R21 Rash and other nonspecific skin eruption: Secondary | ICD-10-CM | POA: Diagnosis not present

## 2017-11-25 DIAGNOSIS — J3089 Other allergic rhinitis: Secondary | ICD-10-CM | POA: Diagnosis not present

## 2017-12-12 DIAGNOSIS — Z0000 Encounter for general adult medical examination without abnormal findings: Secondary | ICD-10-CM | POA: Diagnosis not present

## 2017-12-12 DIAGNOSIS — M79674 Pain in right toe(s): Secondary | ICD-10-CM | POA: Diagnosis not present

## 2017-12-12 DIAGNOSIS — B351 Tinea unguium: Secondary | ICD-10-CM | POA: Diagnosis not present

## 2017-12-12 DIAGNOSIS — M79675 Pain in left toe(s): Secondary | ICD-10-CM | POA: Diagnosis not present

## 2017-12-12 DIAGNOSIS — E039 Hypothyroidism, unspecified: Secondary | ICD-10-CM | POA: Diagnosis not present

## 2017-12-12 DIAGNOSIS — I10 Essential (primary) hypertension: Secondary | ICD-10-CM | POA: Diagnosis not present

## 2017-12-12 DIAGNOSIS — E782 Mixed hyperlipidemia: Secondary | ICD-10-CM | POA: Diagnosis not present

## 2018-01-22 DIAGNOSIS — R58 Hemorrhage, not elsewhere classified: Secondary | ICD-10-CM | POA: Diagnosis not present

## 2018-01-22 DIAGNOSIS — R748 Abnormal levels of other serum enzymes: Secondary | ICD-10-CM | POA: Diagnosis not present

## 2018-01-24 ENCOUNTER — Other Ambulatory Visit: Payer: Self-pay | Admitting: Family Medicine

## 2018-01-24 DIAGNOSIS — R748 Abnormal levels of other serum enzymes: Secondary | ICD-10-CM

## 2018-02-05 ENCOUNTER — Ambulatory Visit
Admission: RE | Admit: 2018-02-05 | Discharge: 2018-02-05 | Disposition: A | Discharge status: Home / Self Care | Payer: Medicare Other | Source: Ambulatory Visit | Attending: Family Medicine | Admitting: Family Medicine

## 2018-02-05 DIAGNOSIS — R748 Abnormal levels of other serum enzymes: Secondary | ICD-10-CM

## 2018-02-05 DIAGNOSIS — K8020 Calculus of gallbladder without cholecystitis without obstruction: Secondary | ICD-10-CM | POA: Diagnosis not present

## 2018-02-10 DIAGNOSIS — J301 Allergic rhinitis due to pollen: Secondary | ICD-10-CM | POA: Diagnosis not present

## 2018-02-10 DIAGNOSIS — J209 Acute bronchitis, unspecified: Secondary | ICD-10-CM | POA: Diagnosis not present

## 2018-02-10 DIAGNOSIS — J4530 Mild persistent asthma, uncomplicated: Secondary | ICD-10-CM | POA: Diagnosis not present

## 2018-02-10 DIAGNOSIS — J019 Acute sinusitis, unspecified: Secondary | ICD-10-CM | POA: Diagnosis not present

## 2018-02-13 DIAGNOSIS — R1030 Lower abdominal pain, unspecified: Secondary | ICD-10-CM | POA: Diagnosis not present

## 2018-02-28 DIAGNOSIS — E871 Hypo-osmolality and hyponatremia: Secondary | ICD-10-CM | POA: Diagnosis not present

## 2018-02-28 DIAGNOSIS — Z23 Encounter for immunization: Secondary | ICD-10-CM | POA: Diagnosis not present

## 2018-02-28 DIAGNOSIS — R1031 Right lower quadrant pain: Secondary | ICD-10-CM | POA: Diagnosis not present

## 2018-03-06 DIAGNOSIS — E871 Hypo-osmolality and hyponatremia: Secondary | ICD-10-CM | POA: Diagnosis not present

## 2018-03-06 DIAGNOSIS — I10 Essential (primary) hypertension: Secondary | ICD-10-CM | POA: Diagnosis not present

## 2018-03-19 DIAGNOSIS — B351 Tinea unguium: Secondary | ICD-10-CM | POA: Diagnosis not present

## 2018-03-19 DIAGNOSIS — M79675 Pain in left toe(s): Secondary | ICD-10-CM | POA: Diagnosis not present

## 2018-03-19 DIAGNOSIS — M79674 Pain in right toe(s): Secondary | ICD-10-CM | POA: Diagnosis not present

## 2018-05-19 DIAGNOSIS — H524 Presbyopia: Secondary | ICD-10-CM | POA: Diagnosis not present

## 2018-05-19 DIAGNOSIS — H52203 Unspecified astigmatism, bilateral: Secondary | ICD-10-CM | POA: Diagnosis not present

## 2018-05-19 DIAGNOSIS — H5203 Hypermetropia, bilateral: Secondary | ICD-10-CM | POA: Diagnosis not present

## 2018-05-19 DIAGNOSIS — H2513 Age-related nuclear cataract, bilateral: Secondary | ICD-10-CM | POA: Diagnosis not present

## 2018-08-08 DIAGNOSIS — R740 Nonspecific elevation of levels of transaminase and lactic acid dehydrogenase [LDH]: Secondary | ICD-10-CM | POA: Diagnosis not present

## 2018-08-08 DIAGNOSIS — I10 Essential (primary) hypertension: Secondary | ICD-10-CM | POA: Diagnosis not present

## 2018-08-08 DIAGNOSIS — E871 Hypo-osmolality and hyponatremia: Secondary | ICD-10-CM | POA: Diagnosis not present

## 2018-08-08 DIAGNOSIS — R1031 Right lower quadrant pain: Secondary | ICD-10-CM | POA: Diagnosis not present

## 2018-08-20 ENCOUNTER — Ambulatory Visit: Payer: Medicare Other | Admitting: Internal Medicine

## 2018-08-20 ENCOUNTER — Other Ambulatory Visit: Payer: Self-pay

## 2018-08-20 ENCOUNTER — Encounter: Payer: Self-pay | Admitting: Internal Medicine

## 2018-08-20 VITALS — BP 104/58 | Ht 70.0 in | Wt 164.2 lb

## 2018-08-20 DIAGNOSIS — E871 Hypo-osmolality and hyponatremia: Secondary | ICD-10-CM | POA: Diagnosis not present

## 2018-08-20 DIAGNOSIS — E039 Hypothyroidism, unspecified: Secondary | ICD-10-CM | POA: Diagnosis not present

## 2018-08-20 LAB — BASIC METABOLIC PANEL
BUN: 20 mg/dL (ref 6–23)
CO2: 31 mEq/L (ref 19–32)
Calcium: 9.3 mg/dL (ref 8.4–10.5)
Chloride: 99 mEq/L (ref 96–112)
Creatinine, Ser: 1.07 mg/dL (ref 0.40–1.50)
GFR: 65.91 mL/min (ref >60.00)
GLUCOSE: 85 mg/dL (ref 70–99)
POTASSIUM: 4.4 meq/L (ref 3.5–5.1)
SODIUM: 134 meq/L — AB (ref 135–145)

## 2018-08-20 LAB — CORTISOL
CORTISOL PLASMA: 17 ug/dL
Cortisol, Plasma: 11.4 ug/dL
Cortisol, Plasma: 17.6 ug/dL

## 2018-08-20 LAB — T4, FREE: FREE T4: 0.99 ng/dL (ref 0.60–1.60)

## 2018-08-20 LAB — TSH: TSH: 4.17 u[IU]/mL (ref 0.35–4.50)

## 2018-08-20 MED ORDER — COSYNTROPIN 0.25 MG IJ SOLR
0.2500 mg | Freq: Once | INTRAMUSCULAR | Status: AC
  Administered 2018-08-20: 0.25 mg via INTRAVENOUS

## 2018-08-20 NOTE — Progress Notes (Signed)
Name: Brent Yu  MRN/ DOB: [id number], Aug 03, 1932    Age/ Sex: 85 y.o., male    PCP: Catha Gosselin, MD   Reason for Endocrinology Evaluation: Hyponatremia     Date of Initial Endocrinology Evaluation: 08/27/2018     HPI: Brent Yu is a 85 y.o. male with a past medical history of HTN, Asthma, GERD, hypothyroidism and dyslipidemia. The patient presented for initial endocrinology clinic visit on 08/27/2018 for consultative assistance with hishyponatremia.   Pt was noted to have hyponatremia in 2018.  It seems like at the time he may have been on some diuretic.  Patient is not able to provide detailed history about this.  But today he denies any dizziness, nausea, fatigue, or weight loss.  In review of his chart it seems like years ago he was worked up for unexplained weight loss. He also denies diarrhea, or skin hyperpigmentation.  He denies taking any herbal supplements or any OTC medications. Patient drinks on average 4 cups of water a day.    HISTORY:  Past Medical History:  Past Medical History:  Diagnosis Date  . Arthritis    HANDS  . Benign prostatic hyperplasia   . Bladder neck contracture   . Complication of anesthesia 1992   after 1992 tumor removal from stomach, my blood pressure went up high in the recovery  but ive had surgeries since then with no problems  . GERD (gastroesophageal reflux disease)   . Gilbert's syndrome    no significant health problems related to this issue   . Hereditary and idiopathic peripheral neuropathy   . History of adenomatous polyp of colon    2010 tubular adenoma/  2015 hyperplastic  . History of benign gastric tumor    resection leiomyoma  . History of esophagitis   . Hyperlipidemia   . Hypertension   . Hypothyroidism   . Lower urinary tract symptoms (LUTS)   . Mild persistent asthma   . Urinary incontinence   . Wears glasses   . Wears partial dentures    UPPER   Past Surgical History:  Past Surgical History:    Procedure Laterality Date  . CYSTO/  BILATERAL RETROGRADE PYELOGRAM/  BLADDER BX'S WITH FULGERATION  03/23/2005  . GASTRIC RESECTION  1992   benign leiomyoma tumor  . INGUINAL HERNIA REPAIR Left 2000  . INGUINAL HERNIA REPAIR Right 01/04/2017   Procedure: OPEN RIGHT INGUINAL HERNIA REPAIR;  Surgeon: Luretha Murphy, MD;  Location: [facility]S;  Service: General;  Laterality: Right;  . LUMBAR LAMINECTOMY  11/09/2015  . REPAIR SUBLUXING TENDON WITH GASTROC SLIDE Right 2012   ankle  . TARSAL TUNNEL RELEASE Bilateral right 08-17-2011/  left 09-11-2011  . TONSILLECTOMY    . TRANSURETHRAL RESECTION OF PROSTATE  03/25/2003  . TURP VAPORIZATION  1999   w/ Indigo laser      Social History:  reports that he quit smoking about 56 years ago. He quit after 10.00 years of use. He has never used smokeless tobacco. He reports that he does not drink alcohol or use drugs.  Family History: family history includes Cancer in his mother; Stroke in his father.   HOME MEDICATIONS: Allergies as of 08/20/2018      Reactions   Lisinopril Cough   Tylenol [acetaminophen] Other (See Comments)   Patient states "instructed not to take due to elevated liver enzymes"      Medication List       Accurate as of August 20, 2018 11:59  PM. Always use your most recent med list.        albuterol 108 (90 Base) MCG/ACT inhaler Commonly known as:  PROVENTIL HFA;VENTOLIN HFA Inhale 1-2 puffs into the lungs every 6 (six) hours as needed for wheezing or shortness of breath.   alfuzosin 10 MG 24 hr tablet Commonly known as:  UROXATRAL Take 10 mg by mouth daily with breakfast.   amLODipine 5 MG tablet Commonly known as:  NORVASC Take 10 mg by mouth every evening. Taking 2 tablets once daily ( 10 MG )   CENTRUM SILVER PO Take 1 tablet by mouth daily.   fluticasone 50 MCG/ACT nasal spray Commonly known as:  FLONASE Place 1 spray into both nostrils 2 (two) times daily.   Fluticasone-Salmeterol 100-50 MCG/DOSE  Aepb Commonly known as:  ADVAIR Inhale 1 puff into the lungs 2 (two) times daily.   gabapentin 800 MG tablet Commonly known as:  NEURONTIN Take 1 tablet (800 mg total) by mouth 3 (three) times daily.   ibuprofen 200 MG tablet Commonly known as:  ADVIL,MOTRIN Take 200 mg by mouth daily as needed for headache or moderate pain.   levocetirizine 5 MG tablet Commonly known as:  XYZAL Take 5 mg by mouth daily.   levothyroxine 25 MCG tablet Commonly known as:  SYNTHROID, LEVOTHROID Take 25 mcg by mouth daily before breakfast.   mometasone 0.1 % ointment Commonly known as:  ELOCON Apply 1 application topically daily as needed (rash).   montelukast 10 MG tablet Commonly known as:  SINGULAIR Take 10 mg by mouth at bedtime.   omeprazole 20 MG tablet Commonly known as:  PRILOSEC OTC Take 20 mg by mouth daily.   Probiotic Caps Take 1 capsule by mouth daily.   sildenafil 100 MG tablet Commonly known as:  VIAGRA Take 100 mg by mouth as needed for erectile dysfunction.   telmisartan 80 MG tablet Commonly known as:  MICARDIS Take 80 mg by mouth every morning.         REVIEW OF SYSTEMS: A comprehensive ROS was conducted with the patient and is negative except as per HPI and below:  Review of Systems  Constitutional: Negative for malaise/fatigue and weight loss.  HENT: Negative for congestion and sore throat.   Eyes: Negative for blurred vision and pain.  Respiratory: Positive for cough. Negative for shortness of breath.   Cardiovascular: Negative for chest pain and palpitations.  Gastrointestinal: Negative for diarrhea and nausea.  Genitourinary: Positive for frequency.  Neurological: Negative for dizziness and headaches.  Endo/Heme/Allergies: Negative for polydipsia.  Psychiatric/Behavioral: Negative for depression. The patient is not nervous/anxious.        OBJECTIVE:  VS: BP (!) 104/58   Ht 5\' 10"  (1.778 m)   Wt 164 lb 3.2 oz (74.5 kg)   BMI 23.56 kg/m    Wt  Readings from Last 3 Encounters:  08/20/18 164 lb 3.2 oz (74.5 kg)  01/04/17 167 lb (75.8 kg)  12/26/16 167 lb (75.8 kg)     EXAM: General: Pt appears well and is in NAD  Hydration: Well-hydrated with moist mucous membranes and good skin turgor  Eyes: External eye exam normal without stare, lid lag or exophthalmos.  EOM intact.   Ears, Nose, Throat: Hearing: Grossly intact bilaterally Dental: Good dentition  Throat: Clear without mass, erythema or exudate  Neck: General: Supple without adenopathy. Thyroid: Thyroid size normal.  No goiter or nodules appreciated. No thyroid bruit.  Lungs: Clear with good BS bilat with no rales, rhonchi,  or wheezes  Heart: Auscultation: RRR.  Abdomen: Normoactive bowel sounds, soft, nontender, without masses or organomegaly palpable  Extremities:  BL LE: 1+  pretibial edema.  Skin: Hair: Texture and amount normal with gender appropriate distribution Skin Inspection: No rashes Skin Palpation: Skin temperature, texture, and thickness normal to palpation  Neuro: Cranial nerves: II - XII grossly intact  Motor: Normal strength throughout DTRs: 2+ and symmetric in UE without delay in relaxation phase  Mental Status: Judgment, insight: Intact Orientation: Oriented to time, place, and person Mood and affect: No depression, anxiety, or agitation     DATA REVIEWED:   Results for Brent Yu, Brent Yu (MRN [id number]) as of 08/21/2018 13:13  Ref. Range 08/20/2018 10:45 08/20/2018 11:22 08/20/2018 11:50  Cortisol, Plasma Latest Units: ug/dL 62.1 30.8 65.7  Results for Brent Yu, Brent Yu (MRN [id number]) as of 08/21/2018 13:13  Ref. Range 08/20/2018 10:45  TSH Latest Ref Range: 0.35 - 4.50 uIU/mL 4.17  T4,Free(Direct) Latest Ref Range: 0.60 - 1.60 ng/dL 8.41   Results for Brent Yu, Brent Yu (MRN [id number]) as of 08/21/2018 13:13  Ref. Range 08/20/2018 10:45  Sodium Latest Ref Range: 135 - 145 mEq/L 134 (L)  Potassium Latest Ref Range: 3.5 - 5.1 mEq/L 4.4  Chloride Latest Ref Range:  96 - 112 mEq/L 99  CO2 Latest Ref Range: 19 - 32 mEq/L 31  Glucose Latest Ref Range: 70 - 99 mg/dL 85  BUN Latest Ref Range: 6 - 23 mg/dL 20  Creatinine Latest Ref Range: 0.40 - 1.50 mg/dL 2.53  Calcium Latest Ref Range: 8.4 - 10.5 mg/dL 9.3  GFR Latest Ref Range: >60.00 mL/min 65.91    Serum osmolality- pending  Results for Brent Yu, Brent Yu (MRN [id number]) as of 08/21/2018 13:13  Ref. Range 08/20/2018 10:45  Sodium, Urine Latest Ref Range: 28 - 272 mmol/L 59   Results for Brent Yu, Brent Yu (MRN [id number]) as of 08/27/2018 08:28  Ref. Range 08/20/2018 10:45 08/20/2018 11:22 08/20/2018 11:50 08/21/2018 13:36  Cortisol, Plasma Latest Units: ug/dL 64.3 32.9 51.8    Urine and serum osmolality-pending  ASSESSMENT/PLAN/RECOMMENDATIONS:   1. Hyponatremia,  in the setting of volume overload:  -His hyponatremia is chronic hence the lack of any symptoms. -He is clinically and biochemically euthyroid -Cosyntropin stimulation test shows his adrenal's ability to stimulate from 11.4 to 17.6 ug/dl and the patient lacks any symptoms/signs of adrenal insufficiency.  -There is no evidence of any recent surgeries. -On exam today there is evidence of lower extremity edema, with 1+ edema bilaterally over the shin area, this indicates low effective arterial blood volume, which will stimulate ADH secretion,  it is unclear to me the cause of his edema, as there is no history of CHF, or liver cirrhosis,documented in his chart,  but the patient does indicate that in the past he did have elevated LFTs.  - His sodium level today is 134 mEq/L  - I would recommend starting with a water restriction ~ 32 ounce/day , this is most likely difficult to keep up by the patient, and maybe more beneficial for the patient to treat the cause of his fluid overload.  - I will defer further work up of LE edema to his PCP, as finding the cause and treating it may help improve his hyponatremia.    F/U in 3 months.   Addendum: I have  attempted to contact the patient over the phone on 3/11 around noon but the call was not going through, I have sent him a message through the  portal.   Signed electronically by: Brent HerrlichAbby Jaralla Deforrest Bogle, MD  [facility]  [facility] [address] [place], [place] [address] Phone: [phone] FAX: [phone]   CC: Catha GosselinLittle, Kevin, MD [address] [place] [address] Phone: [phone] Fax: [phone]   Return to Endocrinology clinic as below: No future appointments.

## 2018-08-20 NOTE — Patient Instructions (Signed)
-   Please stop by the lab today, we will contact you with the results and if there are any additional work up that needs to be done from endocrinology stand point.

## 2018-08-21 LAB — ALBUMIN: Albumin: 4.1 g/dL (ref 3.5–5.2)

## 2018-08-26 ENCOUNTER — Other Ambulatory Visit: Payer: Self-pay

## 2018-08-27 DIAGNOSIS — E871 Hypo-osmolality and hyponatremia: Secondary | ICD-10-CM | POA: Insufficient documentation

## 2018-08-30 LAB — SODIUM, URINE, RANDOM: Sodium, Ur: 59 mmol/L (ref 28–272)

## 2018-08-30 LAB — OSMOLALITY: OSMOLALITY: 280 mosm/kg (ref 278–305)

## 2018-08-30 LAB — EXTRA URINE SPECIMEN

## 2018-08-30 LAB — OSMOLALITY, URINE: OSMOLALITY UR: 621 mosm/kg (ref 50–1200)

## 2018-11-12 DIAGNOSIS — I10 Essential (primary) hypertension: Secondary | ICD-10-CM | POA: Diagnosis not present

## 2018-11-12 DIAGNOSIS — J45909 Unspecified asthma, uncomplicated: Secondary | ICD-10-CM | POA: Diagnosis not present

## 2018-11-12 DIAGNOSIS — E782 Mixed hyperlipidemia: Secondary | ICD-10-CM | POA: Diagnosis not present

## 2018-11-12 DIAGNOSIS — Z0000 Encounter for general adult medical examination without abnormal findings: Secondary | ICD-10-CM | POA: Diagnosis not present

## 2018-11-26 DIAGNOSIS — N401 Enlarged prostate with lower urinary tract symptoms: Secondary | ICD-10-CM | POA: Diagnosis not present

## 2018-11-26 DIAGNOSIS — R3912 Poor urinary stream: Secondary | ICD-10-CM | POA: Diagnosis not present

## 2018-11-28 DIAGNOSIS — L578 Other skin changes due to chronic exposure to nonionizing radiation: Secondary | ICD-10-CM | POA: Diagnosis not present

## 2018-11-28 DIAGNOSIS — L814 Other melanin hyperpigmentation: Secondary | ICD-10-CM | POA: Diagnosis not present

## 2018-11-28 DIAGNOSIS — L570 Actinic keratosis: Secondary | ICD-10-CM | POA: Diagnosis not present

## 2018-11-28 DIAGNOSIS — L820 Inflamed seborrheic keratosis: Secondary | ICD-10-CM | POA: Diagnosis not present

## 2018-12-03 DIAGNOSIS — J301 Allergic rhinitis due to pollen: Secondary | ICD-10-CM | POA: Diagnosis not present

## 2018-12-03 DIAGNOSIS — J3089 Other allergic rhinitis: Secondary | ICD-10-CM | POA: Diagnosis not present

## 2018-12-03 DIAGNOSIS — J4530 Mild persistent asthma, uncomplicated: Secondary | ICD-10-CM | POA: Diagnosis not present

## 2018-12-03 DIAGNOSIS — R21 Rash and other nonspecific skin eruption: Secondary | ICD-10-CM | POA: Diagnosis not present

## 2018-12-04 ENCOUNTER — Encounter: Payer: Self-pay | Admitting: Internal Medicine

## 2018-12-04 ENCOUNTER — Ambulatory Visit (INDEPENDENT_AMBULATORY_CARE_PROVIDER_SITE_OTHER): Payer: Medicare Other | Admitting: Internal Medicine

## 2018-12-04 ENCOUNTER — Other Ambulatory Visit: Payer: Self-pay

## 2018-12-04 VITALS — BP 132/72 | HR 62 | Temp 97.5°F | Ht 69.0 in | Wt 165.4 lb

## 2018-12-04 DIAGNOSIS — E222 Syndrome of inappropriate secretion of antidiuretic hormone: Secondary | ICD-10-CM | POA: Diagnosis not present

## 2018-12-04 DIAGNOSIS — E871 Hypo-osmolality and hyponatremia: Secondary | ICD-10-CM

## 2018-12-04 LAB — BASIC METABOLIC PANEL
BUN: 22 mg/dL (ref 6–23)
CO2: 29 mEq/L (ref 19–32)
Calcium: 9.3 mg/dL (ref 8.4–10.5)
Chloride: 98 mEq/L (ref 96–112)
Creatinine, Ser: 1.06 mg/dL (ref 0.40–1.50)
GFR: 66.59 mL/min (ref >60.00)
Glucose, Bld: 80 mg/dL (ref 70–99)
Potassium: 4.3 mEq/L (ref 3.5–5.1)
Sodium: 134 mEq/L — ABNORMAL LOW (ref 135–145)

## 2018-12-04 NOTE — Progress Notes (Signed)
Name: Brent Yu Granato  MRN/ DOB: [id number], 1933-01-01    Age/ Sex: 85 y.o., male     PCP: Catha GosselinLittle, Kevin, MD   Reason for Endocrinology Evaluation: Hyponatremia     Initial Endocrinology Clinic Visit: 08/27/2018    PATIENT IDENTIFIER: [name] Brent Yu is a 85 y.o., male with a past medical history of HTN, Asthma, GERD, hypothyroidism and dyslipidemia. He has followed with [facility] since 08/27/2018 for consultative assistance with management of his hyponatremia.   HISTORICAL SUMMARY: The patient was noted to have hyponatremia in 2018.  It seems that  at the time he may have been on some diuretic.  Patient was not able to provide detailed history about this.  He is on sodium chloride tablets 1 gram TID.     SUBJECTIVE:   During last visit (08/20/2018): Sodium level at 134 mmol/Yu . Recommended water restriction.    Today (12/07/2018):  [name] is here for for a 3 month follow up   Today he has occasional dizziness, denies nausea or diarrhea.   Denies weight loss.  Checks BP at home and denies hypotensive episodes or loss of consciousness.  Pt on salt tablets , he is compliant with them .     ROS:  As per HPI.   HISTORY:  Past Medical History:  Past Medical History:  Diagnosis Date  . Arthritis    HANDS  . Benign prostatic hyperplasia   . Bladder neck contracture   . Complication of anesthesia 1992   after 1992 tumor removal from stomach, my blood pressure went up high in the recovery  but ive had surgeries since then with no problems  . GERD (gastroesophageal reflux disease)   . Gilbert's syndrome    no significant health problems related to this issue   . Hereditary and idiopathic peripheral neuropathy   . History of adenomatous polyp of colon    2010 tubular adenoma/  2015 hyperplastic  . History of benign gastric tumor    resection leiomyoma  . History of esophagitis   . Hyperlipidemia   . Hypertension   . Hypothyroidism   . Lower urinary  tract symptoms (LUTS)   . Mild persistent asthma   . Urinary incontinence   . Wears glasses   . Wears partial dentures    UPPER   Past Surgical History:  Past Surgical History:  Procedure Laterality Date  . CYSTO/  BILATERAL RETROGRADE PYELOGRAM/  BLADDER BX'S WITH FULGERATION  03/23/2005  . GASTRIC RESECTION  1992   benign leiomyoma tumor  . INGUINAL HERNIA REPAIR Left 2000  . INGUINAL HERNIA REPAIR Right 01/04/2017   Procedure: OPEN RIGHT INGUINAL HERNIA REPAIR;  Surgeon: Luretha MurphyMartin, Matthew, MD;  Location: [facility];  Service: General;  Laterality: Right;  . LUMBAR LAMINECTOMY  11/09/2015  . REPAIR SUBLUXING TENDON WITH GASTROC SLIDE Right 2012   ankle  . TARSAL TUNNEL RELEASE Bilateral right 08-17-2011/  left 09-11-2011  . TONSILLECTOMY    . TRANSURETHRAL RESECTION OF PROSTATE  03/25/2003  . TURP VAPORIZATION  1999   w/ Indigo laser    Social History:  reports that he quit smoking about 56 years ago. He quit after 10.00 years of use. He has never used smokeless tobacco. He reports that he does not drink alcohol or use drugs. Family History:  Family History  Problem Relation Age of Onset  . Cancer Mother   . Stroke Father      HOME MEDICATIONS: Allergies as of 12/04/2018  Reactions   Lisinopril Cough   Tylenol [acetaminophen] Other (See Comments)   Patient states "instructed not to take due to elevated liver enzymes"      Medication List       Accurate as of December 04, 2018 11:59 PM. If you have any questions, ask your nurse or doctor.        STOP taking these medications   alfuzosin 10 MG 24 hr tablet Commonly known as: UROXATRAL Stopped by: Scarlette ShortsIbtehal J Leya Paige, MD     TAKE these medications   albuterol 108 (90 Base) MCG/ACT inhaler Commonly known as: VENTOLIN HFA Inhale 1-2 puffs into the lungs every 6 (six) hours as needed for wheezing or shortness of breath.   amLODipine 5 MG tablet Commonly known as: NORVASC Take 10 mg by mouth every evening. Taking 2  tablets once daily ( 10 MG )   CENTRUM SILVER PO Take 1 tablet by mouth daily.   fluticasone 50 MCG/ACT nasal spray Commonly known as: FLONASE Place 1 spray into both nostrils 2 (two) times daily.   Fluticasone-Salmeterol 100-50 MCG/DOSE Aepb Commonly known as: ADVAIR Inhale 1 puff into the lungs 2 (two) times daily.   gabapentin 800 MG tablet Commonly known as: NEURONTIN Take 1 tablet (800 mg total) by mouth 3 (three) times daily.   ibuprofen 200 MG tablet Commonly known as: ADVIL Take 200 mg by mouth daily as needed for headache or moderate pain.   levocetirizine 5 MG tablet Commonly known as: XYZAL Take 5 mg by mouth daily.   levothyroxine 25 MCG tablet Commonly known as: SYNTHROID Take 25 mcg by mouth daily before breakfast.   mometasone 0.1 % ointment Commonly known as: ELOCON Apply 1 application topically daily as needed (rash).   montelukast 10 MG tablet Commonly known as: SINGULAIR Take 10 mg by mouth at bedtime.   omeprazole 20 MG tablet Commonly known as: PRILOSEC OTC Take 20 mg by mouth daily.   Probiotic Caps Take 1 capsule by mouth daily.   sildenafil 100 MG tablet Commonly known as: VIAGRA Take 100 mg by mouth as needed for erectile dysfunction.   sodium chloride 1 g tablet Take 1 g by mouth 3 (three) times daily.   telmisartan 80 MG tablet Commonly known as: MICARDIS Take 80 mg by mouth every morning.         OBJECTIVE:   PHYSICAL EXAM: VS: BP 132/72 (BP Location: Left Arm, Patient Position: Sitting, Cuff Size: Normal)   Pulse 62   Temp (!) 97.5 F (36.4 C)   Ht 5\' 9"  (1.753 m)   Wt 165 lb 6.4 oz (75 kg)   SpO2 98%   BMI 24.43 kg/m    EXAM: General: Pt appears well and is in NAD  Hydration: Well-hydrated with moist mucous membranes and good skin turgor  Neck: General: Supple without adenopathy. Thyroid: Thyroid size normal.  No goiter or nodules appreciated. No thyroid bruit.  Lungs: Clear with good BS bilat with no rales,  rhonchi, or wheezes  Heart: Auscultation: RRR.  Abdomen: Normoactive bowel sounds, soft, nontender, without masses or organomegaly palpable  Extremities:  BL LE: Trace  pretibial edema  Neuro: Cranial nerves: II - XII grossly intact  Motor: Normal strength throughout DTRs: 2+ and symmetric in UE without delay in relaxation phase  Mental Status: Judgment, insight: Intact Orientation: Oriented to time, place, and person Mood and affect: No depression, anxiety, or agitation     DATA REVIEWED: Results for Brent GoonIPER, Jarret Yu (MRN [id number]) as  of 12/07/2018 15:16  Ref. Range 12/04/2018 10:45  Sodium Latest Ref Range: 135 - 145 mEq/Yu 134 (Yu)  Potassium Latest Ref Range: 3.5 - 5.1 mEq/Yu 4.3  Chloride Latest Ref Range: 96 - 112 mEq/Yu 98  CO2 Latest Ref Range: 19 - 32 mEq/Yu 29  Glucose Latest Ref Range: 70 - 99 mg/dL 80  BUN Latest Ref Range: 6 - 23 mg/dL 22  Creatinine Latest Ref Range: 0.40 - 1.50 mg/dL 1.06  Calcium Latest Ref Range: 8.4 - 10.5 mg/dL 9.3  GFR Latest Ref Range: >60.00 mL/min 66.59      ASSESSMENT / PLAN / RECOMMENDATIONS:   1. Syndrome of Inappropriate Antidiuretic Hormone Secretion(SIADH)   - Pt continues to be asymptomatic - He has been on salt tablets for quite sometime, I was not aware of this on his last visit.  - His serum sodium has been stable at 134 mmol/Yu, which is acceptable.  - We discussed fluid restriction at 32 oz daily  Medications  Continue sodium chloride 1 gram TID  Fluid restriction at 32 oz daily     F/u in 6 months    Signed electronically by: Mack Guise, MD  [facility] Endocrinology  [facility] [address], [place] [address] Phone: [phone] FAX: [phone]      CC: [address], [place] [address] Phone: [phone]  Fax: [phone]   Return to Endocrinology clinic as below: Future Appointments  Date Time Provider [place]   06/05/2019 10:30 AM Aalaya Yadao, Melanie Crazier, MD [facility] None

## 2018-12-05 DIAGNOSIS — I10 Essential (primary) hypertension: Secondary | ICD-10-CM | POA: Diagnosis not present

## 2018-12-05 DIAGNOSIS — Z0000 Encounter for general adult medical examination without abnormal findings: Secondary | ICD-10-CM | POA: Diagnosis not present

## 2018-12-05 DIAGNOSIS — E782 Mixed hyperlipidemia: Secondary | ICD-10-CM | POA: Diagnosis not present

## 2018-12-05 DIAGNOSIS — J45909 Unspecified asthma, uncomplicated: Secondary | ICD-10-CM | POA: Diagnosis not present

## 2018-12-07 DIAGNOSIS — E222 Syndrome of inappropriate secretion of antidiuretic hormone: Secondary | ICD-10-CM | POA: Insufficient documentation

## 2018-12-26 DIAGNOSIS — L814 Other melanin hyperpigmentation: Secondary | ICD-10-CM | POA: Diagnosis not present

## 2018-12-26 DIAGNOSIS — L578 Other skin changes due to chronic exposure to nonionizing radiation: Secondary | ICD-10-CM | POA: Diagnosis not present

## 2018-12-26 DIAGNOSIS — L570 Actinic keratosis: Secondary | ICD-10-CM | POA: Diagnosis not present

## 2019-03-12 DIAGNOSIS — Z23 Encounter for immunization: Secondary | ICD-10-CM | POA: Diagnosis not present

## 2019-06-01 DIAGNOSIS — H2513 Age-related nuclear cataract, bilateral: Secondary | ICD-10-CM | POA: Diagnosis not present

## 2019-06-03 ENCOUNTER — Other Ambulatory Visit: Payer: Self-pay

## 2019-06-05 ENCOUNTER — Ambulatory Visit (INDEPENDENT_AMBULATORY_CARE_PROVIDER_SITE_OTHER): Payer: Medicare Other | Admitting: Internal Medicine

## 2019-06-05 ENCOUNTER — Encounter: Payer: Self-pay | Admitting: Internal Medicine

## 2019-06-05 VITALS — BP 142/74 | HR 74 | Temp 98.7°F | Ht 69.0 in | Wt 163.2 lb

## 2019-06-05 DIAGNOSIS — E222 Syndrome of inappropriate secretion of antidiuretic hormone: Secondary | ICD-10-CM | POA: Diagnosis not present

## 2019-06-05 LAB — BASIC METABOLIC PANEL
BUN: 22 mg/dL (ref 6–23)
CO2: 30 mEq/L (ref 19–32)
Calcium: 9.6 mg/dL (ref 8.4–10.5)
Chloride: 96 mEq/L (ref 96–112)
Creatinine, Ser: 1 mg/dL (ref 0.40–1.50)
GFR: 71.13 mL/min (ref >60.00)
Glucose, Bld: 91 mg/dL (ref 70–99)
Potassium: 4.4 mEq/L (ref 3.5–5.1)
Sodium: 131 mEq/L — ABNORMAL LOW (ref 135–145)

## 2019-06-05 MED ORDER — SODIUM CHLORIDE 1 G PO TABS: 2.0000 g | ORAL_TABLET | Freq: Three times a day (TID) | ORAL | 11 refills | Status: AC

## 2019-06-05 NOTE — Progress Notes (Signed)
Name: Brent Yu  MRN/ DOB: [id number], [birth year]    Age/ Sex: 85 y.o., male     PCP: Catha GosselinLittle, Kevin, MD   Reason for Endocrinology Evaluation: Hyponatremia     Initial Endocrinology Clinic Visit: 08/27/2018    PATIENT IDENTIFIER: Brent Yu is a 85 y.o., male with a past medical history of HTN, Asthma, GERD, hypothyroidism and dyslipidemia. He has followed with  Endocrinology clinic since 08/27/2018 for consultative assistance with management of his hyponatremia.   HISTORICAL SUMMARY: The patient was noted to have hyponatremia in 2018.  It seems that  at the time he may have been on some diuretic.  Patient was not able to provide detailed history about this.  He is on sodium chloride tablets 1 gram TID.   SUBJECTIVE:   During last visit (12/04/2018): Sodium level stable at 134 mmol/L . Recommended water restriction and sodium chloride tabs    Today (06/05/2019):  Brent Yu is here for for a 6 month follow up on hyponatremia. He has been compliant with salt tablets but he doesn't believe he has been following the fluid restriction.Pt does state he has urinary frequency and tends to avoid fluids in general. He denies any headaches, visual changes or dizziness.  He also denies nausea or diarrhea.        ROS:  As per HPI.   HISTORY:  Past Medical History:  Past Medical History:  Diagnosis Date  . Arthritis    HANDS  . Benign prostatic hyperplasia   . Bladder neck contracture   . Complication of anesthesia 1992   after 1992 tumor removal from stomach, my blood pressure went up high in the recovery  but ive had surgeries since then with no problems  . GERD (gastroesophageal reflux disease)   . Gilbert's syndrome    no significant health problems related to this issue   . Hereditary and idiopathic peripheral neuropathy   . History of adenomatous polyp of colon    2010 tubular adenoma/  2015 hyperplastic  . History of benign gastric tumor    resection  leiomyoma  . History of esophagitis   . Hyperlipidemia   . Hypertension   . Hypothyroidism   . Lower urinary tract symptoms (LUTS)   . Mild persistent asthma   . Urinary incontinence   . Wears glasses   . Wears partial dentures    UPPER   Past Surgical History:  Past Surgical History:  Procedure Laterality Date  . CYSTO/  BILATERAL RETROGRADE PYELOGRAM/  BLADDER BX'S WITH FULGERATION  03/23/2005  . GASTRIC RESECTION  1992   benign leiomyoma tumor  . INGUINAL HERNIA REPAIR Left 2000  . INGUINAL HERNIA REPAIR Right 01/04/2017   Procedure: OPEN RIGHT INGUINAL HERNIA REPAIR;  Surgeon: Luretha MurphyMartin, Matthew, MD;  Location: [facility];  Service: General;  Laterality: Right;  . LUMBAR LAMINECTOMY  11/09/2015  . REPAIR SUBLUXING TENDON WITH GASTROC SLIDE Right 2012   ankle  . TARSAL TUNNEL RELEASE Bilateral right 08-17-2011/  left 09-11-2011  . TONSILLECTOMY    . TRANSURETHRAL RESECTION OF PROSTATE  03/25/2003  . TURP VAPORIZATION  1999   w/ Indigo laser    Social History:  reports that he quit smoking about 57 years ago. He quit after 10.00 years of use. He has never used smokeless tobacco. He reports that he does not drink alcohol or use drugs. Family History:  Family History  Problem Relation Age of Onset  . Cancer Mother   . Stroke Father  HOME MEDICATIONS: Allergies as of 06/05/2019      Reactions   Lisinopril Cough   Tylenol [acetaminophen] Other (See Comments)   Patient states "instructed not to take due to elevated liver enzymes"      Medication List       Accurate as of June 05, 2019 10:28 AM. If you have any questions, ask your nurse or doctor.        albuterol 108 (90 Base) MCG/ACT inhaler Commonly known as: VENTOLIN HFA Inhale 1-2 puffs into the lungs every 6 (six) hours as needed for wheezing or shortness of breath.   amLODipine 5 MG tablet Commonly known as: NORVASC Take 10 mg by mouth every evening. Taking 2 tablets once daily ( 10 MG )   CENTRUM  SILVER PO Take 1 tablet by mouth daily.   fluticasone 50 MCG/ACT nasal spray Commonly known as: FLONASE Place 1 spray into both nostrils 2 (two) times daily.   Fluticasone-Salmeterol 100-50 MCG/DOSE Aepb Commonly known as: ADVAIR Inhale 1 puff into the lungs 2 (two) times daily.   gabapentin 800 MG tablet Commonly known as: NEURONTIN Take 1 tablet (800 mg total) by mouth 3 (three) times daily.   ibuprofen 200 MG tablet Commonly known as: ADVIL Take 200 mg by mouth daily as needed for headache or moderate pain.   levocetirizine 5 MG tablet Commonly known as: XYZAL Take 5 mg by mouth daily.   levothyroxine 25 MCG tablet Commonly known as: SYNTHROID Take 25 mcg by mouth daily before breakfast.   mometasone 0.1 % ointment Commonly known as: ELOCON Apply 1 application topically daily as needed (rash).   montelukast 10 MG tablet Commonly known as: SINGULAIR Take 10 mg by mouth at bedtime.   omeprazole 20 MG tablet Commonly known as: PRILOSEC OTC Take 20 mg by mouth daily.   Probiotic Caps Take 1 capsule by mouth daily.   sildenafil 100 MG tablet Commonly known as: VIAGRA Take 100 mg by mouth as needed for erectile dysfunction.   sodium chloride 1 g tablet Take 1 g by mouth 3 (three) times daily.   telmisartan 80 MG tablet Commonly known as: MICARDIS Take 80 mg by mouth every morning.         OBJECTIVE:   PHYSICAL EXAM: VS: BP (!) 142/74 (BP Location: Left Arm, Patient Position: Sitting, Cuff Size: Normal)   Pulse 74   Temp 98.7 F (37.1 C)   Ht 5\' 9"  (1.753 m)   Wt 163 lb 3.2 oz (74 kg)   SpO2 97%   BMI 24.10 kg/m    EXAM: General: Pt appears well and is in NAD  Neck: General: Supple without adenopathy. Thyroid: Thyroid size normal.  No goiter or nodules appreciated. No thyroid bruit.  Lungs: Clear with good BS bilat with no rales, rhonchi, or wheezes  Heart: Auscultation: RRR.  Abdomen: Normoactive bowel sounds, soft, nontender, without masses  or organomegaly palpable  Extremities:  BL LE: Trace  pretibial edema  Mental Status: Judgment, insight: Intact Orientation: Oriented to time, place, and person Mood and affect: No depression, anxiety, or agitation     DATA REVIEWED:  Results for Brent Yu, Brent Yu (MRN Brent Goon) as of 06/05/2019 15:50  Ref. Range 06/05/2019 10:44  Sodium Latest Ref Range: 135 - 145 mEq/L 131 (L)  Potassium Latest Ref Range: 3.5 - 5.1 mEq/L 4.4  Chloride Latest Ref Range: 96 - 112 mEq/L 96  CO2 Latest Ref Range: 19 - 32 mEq/L 30  Glucose Latest Ref Range: 70 -  99 mg/dL 91  BUN Latest Ref Range: 6 - 23 mg/dL 22  Creatinine Latest Ref Range: 0.40 - 1.50 mg/dL 1.00  Calcium Latest Ref Range: 8.4 - 10.5 mg/dL 9.6  GFR Latest Ref Range: >60.00 mL/min 71.13      ASSESSMENT / PLAN / RECOMMENDATIONS:   1. Syndrome of Inappropriate Antidiuretic Hormone Secretion(SIADH)   - Pt continues to be asymptomatic - His serum sodium has trended down, it seems he is having difficulty keeping track of fluids, even though he tries to limit due to urinary frequency, I have recommended increasing salt tablets to 2 tabs TID  - I have encouraged him to try fluid restriction at 32 oz daily - No prior brain imaging had been done.Will order and MRI to assess pituitary  - Will check urine and serum Osm. On next visit, and consider adding lasix if necessary  - We also discussed demeclocycline if sodium is < 130 mEq/L, but would try to avoid this due to side effects of nephrotoxicity and nausea/vomiting.   Medications  Increase sodium chloride 2 gram TID  Fluid restriction at 32 oz daily     F/u in 6 months    Addendum: discussed labs and new recommendation with pt on 06/05/19 at [place] electronically by: Mack Guise, MD  [facility] Endocrinology  [facility] [address] [place], [place] [address] Phone: [phone] FAX: [phone]      CC: [address], [place] [address] Phone: [phone]  Fax: [phone]   Return to Endocrinology clinic as below: Future Appointments  Date Time Provider [place]  06/05/2019 10:30 AM Alannah Averhart, Melanie Crazier, MD [facility] None

## 2019-06-05 NOTE — Patient Instructions (Addendum)
-   Continue Sodium chloride tablets 1 gram, three times a day  - Continue with fluid restriction at 32 oz daily

## 2019-06-15 ENCOUNTER — Telehealth: Payer: Self-pay

## 2019-06-15 NOTE — Telephone Encounter (Signed)
[employer] approved MRI of brain before and after contrast from 06/11/19-12/07/2019. Authoriztion # [id number]

## 2019-06-23 ENCOUNTER — Telehealth: Payer: Self-pay | Admitting: Internal Medicine

## 2019-06-23 NOTE — Telephone Encounter (Signed)
I can dosages for sodium but please advise on wait for scan?

## 2019-06-23 NOTE — Telephone Encounter (Signed)
Pt informed

## 2019-06-23 NOTE — Telephone Encounter (Signed)
Patient called to advise that he is going to postpone his brian scan until after he receives hi COVID 19 vaccines and wanted to know if Dr Lonzo Cloud had any objection to him waiting.    Also wanted to get clarification on the amount of Sodium Chloride he is supposed to be taking.  [phone]

## 2019-07-07 ENCOUNTER — Other Ambulatory Visit: Payer: Medicare Other

## 2019-08-10 ENCOUNTER — Ambulatory Visit
Admission: RE | Admit: 2019-08-10 | Discharge: 2019-08-10 | Disposition: A | Discharge status: Home / Self Care | Payer: Medicare Other | Source: Ambulatory Visit | Attending: Internal Medicine | Admitting: Internal Medicine

## 2019-08-10 ENCOUNTER — Other Ambulatory Visit: Payer: Self-pay

## 2019-08-10 DIAGNOSIS — E222 Syndrome of inappropriate secretion of antidiuretic hormone: Secondary | ICD-10-CM | POA: Diagnosis not present

## 2019-08-10 MED ORDER — GADOBENATE DIMEGLUMINE 529 MG/ML IV SOLN
10.0000 mL | Freq: Once | INTRAVENOUS | Status: AC | PRN
  Administered 2019-08-10: 10 mL via INTRAVENOUS

## 2019-11-13 DIAGNOSIS — J45909 Unspecified asthma, uncomplicated: Secondary | ICD-10-CM | POA: Diagnosis not present

## 2019-11-13 DIAGNOSIS — N400 Enlarged prostate without lower urinary tract symptoms: Secondary | ICD-10-CM | POA: Diagnosis not present

## 2019-11-13 DIAGNOSIS — E782 Mixed hyperlipidemia: Secondary | ICD-10-CM | POA: Diagnosis not present

## 2019-11-13 DIAGNOSIS — E039 Hypothyroidism, unspecified: Secondary | ICD-10-CM | POA: Diagnosis not present

## 2019-11-18 DIAGNOSIS — E871 Hypo-osmolality and hyponatremia: Secondary | ICD-10-CM | POA: Diagnosis not present

## 2019-11-18 DIAGNOSIS — E039 Hypothyroidism, unspecified: Secondary | ICD-10-CM | POA: Diagnosis not present

## 2019-11-18 DIAGNOSIS — I10 Essential (primary) hypertension: Secondary | ICD-10-CM | POA: Diagnosis not present

## 2019-11-18 DIAGNOSIS — Z0000 Encounter for general adult medical examination without abnormal findings: Secondary | ICD-10-CM | POA: Diagnosis not present

## 2019-11-27 DIAGNOSIS — R21 Rash and other nonspecific skin eruption: Secondary | ICD-10-CM | POA: Diagnosis not present

## 2019-11-27 DIAGNOSIS — J301 Allergic rhinitis due to pollen: Secondary | ICD-10-CM | POA: Diagnosis not present

## 2019-11-27 DIAGNOSIS — J4530 Mild persistent asthma, uncomplicated: Secondary | ICD-10-CM | POA: Diagnosis not present

## 2019-11-27 DIAGNOSIS — J3089 Other allergic rhinitis: Secondary | ICD-10-CM | POA: Diagnosis not present

## 2019-11-30 DIAGNOSIS — N401 Enlarged prostate with lower urinary tract symptoms: Secondary | ICD-10-CM | POA: Diagnosis not present

## 2019-11-30 DIAGNOSIS — R351 Nocturia: Secondary | ICD-10-CM | POA: Diagnosis not present

## 2019-12-10 ENCOUNTER — Ambulatory Visit: Payer: Medicare Other | Admitting: Internal Medicine

## 2019-12-10 ENCOUNTER — Other Ambulatory Visit: Payer: Self-pay

## 2019-12-10 VITALS — BP 124/72 | HR 62 | Ht 71.0 in | Wt 159.8 lb

## 2019-12-10 DIAGNOSIS — E222 Syndrome of inappropriate secretion of antidiuretic hormone: Secondary | ICD-10-CM | POA: Diagnosis not present

## 2019-12-10 NOTE — Progress Notes (Signed)
Name: Brent Yu  MRN/ DOB: [id number], 11-Dec-1932    Age/ Sex: 86 y.o., male     PCP: Hulan Fess, MD   Reason for Endocrinology Evaluation: Hyponatremia     Initial Endocrinology Clinic Visit: 08/27/2018    PATIENT IDENTIFIER: [name] is a 86 y.o., male with a past medical history of HTN, Asthma, GERD, hypothyroidism and dyslipidemia. He has followed with [facility] since 08/27/2018 for consultative assistance with management of his hyponatremia.   HISTORICAL SUMMARY: The patient was noted to have hyponatremia in 2018.  It seems that  at the time he may have been on some diuretic.  Patient was not able to provide detailed history about this.  He is on sodium chloride tablets 1 gram TID.   SUBJECTIVE:   Today (12/10/2019):  Mr. [name] is here for for a 6 month follow up on hyponatremia. He has been trying to stick to 32 oz per day water restriction but this is difficult at times for him when he eats soup.  He has been compliant with salt tablet intake at  2 grams TID He denies any headaches, visual changes or dizziness.  He also denies nausea or diarrhea.        ROS:  As per HPI.   HISTORY:  Past Medical History:  Past Medical History:  Diagnosis Date   Arthritis    HANDS   Benign prostatic hyperplasia    Bladder neck contracture    Complication of anesthesia 1992   after 1992 tumor removal from stomach, my blood pressure went up high in the recovery  but ive had surgeries since then with no problems   GERD (gastroesophageal reflux disease)    Gilbert's syndrome    no significant health problems related to this issue    Hereditary and idiopathic peripheral neuropathy    History of adenomatous polyp of colon    2010 tubular adenoma/  2015 hyperplastic   History of benign gastric tumor    resection leiomyoma   History of esophagitis    Hyperlipidemia    Hypertension    Hypothyroidism    Lower urinary tract symptoms  (LUTS)    Mild persistent asthma    Urinary incontinence    Wears glasses    Wears partial dentures    UPPER   Past Surgical History:  Past Surgical History:  Procedure Laterality Date   CYSTO/  BILATERAL RETROGRADE PYELOGRAM/  BLADDER BX'S WITH FULGERATION  03/23/2005   GASTRIC RESECTION  1992   benign leiomyoma tumor   INGUINAL HERNIA REPAIR Left 2000   INGUINAL HERNIA REPAIR Right 01/04/2017   Procedure: OPEN RIGHT INGUINAL HERNIA REPAIR;  Surgeon: Johnathan Hausen, MD;  Location: [facility];  Service: General;  Laterality: Right;   LUMBAR LAMINECTOMY  11/09/2015   REPAIR SUBLUXING TENDON WITH GASTROC SLIDE Right 2012   ankle   TARSAL TUNNEL RELEASE Bilateral right 08-17-2011/  left 09-11-2011   TONSILLECTOMY     TRANSURETHRAL RESECTION OF PROSTATE  03/25/2003   TURP VAPORIZATION  1999   w/ Indigo laser    Social History:  reports that he quit smoking about 57 years ago. He quit after 10.00 years of use. He has never used smokeless tobacco. He reports that he does not drink alcohol and does not use drugs. Family History:  Family History  Problem Relation Age of Onset   Cancer Mother    Stroke Father      HOME MEDICATIONS: Allergies as of 12/10/2019  Reactions   Lisinopril Cough   Tylenol [acetaminophen] Other (See Comments)   Patient states "instructed not to take due to elevated liver enzymes"      Medication List       Accurate as of December 10, 2019  1:04 PM. If you have any questions, ask your nurse or doctor.        albuterol 108 (90 Base) MCG/ACT inhaler Commonly known as: VENTOLIN HFA Inhale 1-2 puffs into the lungs every 6 (six) hours as needed for wheezing or shortness of breath.   amLODipine 5 MG tablet Commonly known as: NORVASC Take 10 mg by mouth every evening. Taking 2 tablets once daily ( 10 MG )   CENTRUM SILVER PO Take 1 tablet by mouth daily.   fluticasone 50 MCG/ACT nasal spray Commonly known as: FLONASE Place 1 spray into  both nostrils 2 (two) times daily.   Fluticasone-Salmeterol 100-50 MCG/DOSE Aepb Commonly known as: ADVAIR Inhale 1 puff into the lungs 2 (two) times daily.   gabapentin 800 MG tablet Commonly known as: NEURONTIN Take 1 tablet (800 mg total) by mouth 3 (three) times daily.   ibuprofen 200 MG tablet Commonly known as: ADVIL Take 200 mg by mouth daily as needed for headache or moderate pain.   levocetirizine 5 MG tablet Commonly known as: XYZAL Take 5 mg by mouth daily.   levothyroxine 25 MCG tablet Commonly known as: SYNTHROID Take 25 mcg by mouth daily before breakfast.   mometasone 0.1 % ointment Commonly known as: ELOCON Apply 1 application topically daily as needed (rash).   montelukast 10 MG tablet Commonly known as: SINGULAIR Take 10 mg by mouth at bedtime.   omeprazole 20 MG tablet Commonly known as: PRILOSEC OTC Take 20 mg by mouth daily.   Probiotic Caps Take 1 capsule by mouth daily.   sildenafil 100 MG tablet Commonly known as: VIAGRA Take 100 mg by mouth as needed for erectile dysfunction.   sodium chloride 1 g tablet Take 2 tablets (2 g total) by mouth 3 (three) times daily.   telmisartan 80 MG tablet Commonly known as: MICARDIS Take 80 mg by mouth every morning.         OBJECTIVE:   PHYSICAL EXAM: VS: BP 124/72 (BP Location: Left Arm, Patient Position: Sitting, Cuff Size: Large)    Pulse 62    Ht 5\' 11"  (1.803 m)    Wt 159 lb 12.8 oz (72.5 kg)    SpO2 97%    BMI 22.29 kg/m    EXAM: General: Pt appears well and is in NAD  Lungs: Clear with good BS bilat with no rales, rhonchi, or wheezes  Heart: Auscultation: RRR.  Abdomen: Normoactive bowel sounds, soft, nontender, without masses or organomegaly palpable  Extremities:  BL LE: No pretibial edema  Mental Status: Judgment, insight: Intact Mood and affect: No depression, anxiety, or agitation     DATA REVIEWED: 11/18/2019  BUN/Cr 22/1.12 GFR 62 Na 139  K 4.4  TSH 5.45 uIU/mL     MRI 08/10/2019 Motion degraded but no pituitary lesion   ASSESSMENT / PLAN / RECOMMENDATIONS:   1. Syndrome of Inappropriate Antidiuretic Hormone Secretion(SIADH)   - Pt continues to be asymptomatic - His serum sodium is normal at 139 but he does not need to have it that high, I am going to reduce his salt tablet intake as below ablets to 2 tabs TID  - I have encouraged him to continue with fluid restriction at 32 oz daily - Brain  imaging unrevealing despite motion degredation - No need to initiate lasix at this time.    Medications  Decrease sodium chloride to 2 gram BID Fluid restriction at 32 oz daily     F/u in 4 months    Signed electronically by: Lyndle Herrlich, MD  [facility] Endocrinology  [facility] [address] [place] [place], [place] [address] Phone: [phone] FAX: [phone]      CC: Catha Gosselin, MD [address] [place] [address] Phone: [phone]  Fax: [phone]   Return to Endocrinology clinic as below: Future Appointments  Date Time Provider Department Center  04/12/2020  9:50 AM Rigby Swamy, Konrad Dolores, MD [facility]

## 2019-12-10 NOTE — Patient Instructions (Signed)
-   Sodium chloride tablets 2 Tablets in the morning and 2 tablets in the evening ( Skip salt tablets with lunch) - Continue with fluid restriction at 32 oz daily

## 2019-12-23 DIAGNOSIS — L814 Other melanin hyperpigmentation: Secondary | ICD-10-CM | POA: Diagnosis not present

## 2019-12-23 DIAGNOSIS — L570 Actinic keratosis: Secondary | ICD-10-CM | POA: Diagnosis not present

## 2019-12-23 DIAGNOSIS — L578 Other skin changes due to chronic exposure to nonionizing radiation: Secondary | ICD-10-CM | POA: Diagnosis not present

## 2019-12-23 DIAGNOSIS — L821 Other seborrheic keratosis: Secondary | ICD-10-CM | POA: Diagnosis not present

## 2020-01-13 DIAGNOSIS — E039 Hypothyroidism, unspecified: Secondary | ICD-10-CM | POA: Diagnosis not present

## 2020-01-13 DIAGNOSIS — E782 Mixed hyperlipidemia: Secondary | ICD-10-CM | POA: Diagnosis not present

## 2020-01-13 DIAGNOSIS — J45909 Unspecified asthma, uncomplicated: Secondary | ICD-10-CM | POA: Diagnosis not present

## 2020-01-13 DIAGNOSIS — I10 Essential (primary) hypertension: Secondary | ICD-10-CM | POA: Diagnosis not present

## 2020-01-22 DIAGNOSIS — L814 Other melanin hyperpigmentation: Secondary | ICD-10-CM | POA: Diagnosis not present

## 2020-01-22 DIAGNOSIS — L570 Actinic keratosis: Secondary | ICD-10-CM | POA: Diagnosis not present

## 2020-02-26 DIAGNOSIS — L821 Other seborrheic keratosis: Secondary | ICD-10-CM | POA: Diagnosis not present

## 2020-02-26 DIAGNOSIS — D1801 Hemangioma of skin and subcutaneous tissue: Secondary | ICD-10-CM | POA: Diagnosis not present

## 2020-02-26 DIAGNOSIS — L814 Other melanin hyperpigmentation: Secondary | ICD-10-CM | POA: Diagnosis not present

## 2020-02-26 DIAGNOSIS — L570 Actinic keratosis: Secondary | ICD-10-CM | POA: Diagnosis not present

## 2020-03-07 DIAGNOSIS — I10 Essential (primary) hypertension: Secondary | ICD-10-CM | POA: Diagnosis not present

## 2020-03-07 DIAGNOSIS — J45909 Unspecified asthma, uncomplicated: Secondary | ICD-10-CM | POA: Diagnosis not present

## 2020-03-07 DIAGNOSIS — E782 Mixed hyperlipidemia: Secondary | ICD-10-CM | POA: Diagnosis not present

## 2020-03-07 DIAGNOSIS — E039 Hypothyroidism, unspecified: Secondary | ICD-10-CM | POA: Diagnosis not present

## 2020-03-10 DIAGNOSIS — H00013 Hordeolum externum right eye, unspecified eyelid: Secondary | ICD-10-CM | POA: Diagnosis not present

## 2020-03-15 DIAGNOSIS — H00011 Hordeolum externum right upper eyelid: Secondary | ICD-10-CM | POA: Diagnosis not present

## 2020-03-28 DIAGNOSIS — H00013 Hordeolum externum right eye, unspecified eyelid: Secondary | ICD-10-CM | POA: Diagnosis not present

## 2020-04-08 DIAGNOSIS — H02881 Meibomian gland dysfunction right upper eyelid: Secondary | ICD-10-CM | POA: Diagnosis not present

## 2020-04-08 DIAGNOSIS — H02886 Meibomian gland dysfunction of left eye, unspecified eyelid: Secondary | ICD-10-CM | POA: Diagnosis not present

## 2020-04-08 DIAGNOSIS — H0011 Chalazion right upper eyelid: Secondary | ICD-10-CM | POA: Diagnosis not present

## 2020-04-08 DIAGNOSIS — H0288A Meibomian gland dysfunction right eye, upper and lower eyelids: Secondary | ICD-10-CM | POA: Diagnosis not present

## 2020-04-08 DIAGNOSIS — H0288B Meibomian gland dysfunction left eye, upper and lower eyelids: Secondary | ICD-10-CM | POA: Diagnosis not present

## 2020-04-12 ENCOUNTER — Other Ambulatory Visit: Payer: Self-pay

## 2020-04-12 ENCOUNTER — Ambulatory Visit: Payer: Medicare Other | Admitting: Internal Medicine

## 2020-04-12 ENCOUNTER — Encounter: Payer: Self-pay | Admitting: Internal Medicine

## 2020-04-12 VITALS — BP 140/70 | HR 63 | Ht 69.0 in | Wt 161.0 lb

## 2020-04-12 DIAGNOSIS — E222 Syndrome of inappropriate secretion of antidiuretic hormone: Secondary | ICD-10-CM

## 2020-04-12 NOTE — Patient Instructions (Signed)
-   Please stop by the lab today  

## 2020-04-12 NOTE — Progress Notes (Signed)
Name: Brent Yu  MRN/ DOB: [id number], 1932/08/08    Age/ Sex: 86 y.o., male     PCP: Catha Gosselin, MD   Reason for Endocrinology Evaluation: Hyponatremia     Initial Endocrinology Clinic Visit: 08/27/2018    PATIENT IDENTIFIER: Brent Yu is a 86 y.o., male with a past medical history of HTN, Asthma, GERD, hypothyroidism and dyslipidemia. He has followed with [facility] since 08/27/2018 for consultative assistance with management of his hyponatremia.   HISTORICAL SUMMARY: The patient was noted to have hyponatremia in 2018.  It seems that  at the time he may have been on some diuretic.  Patient was not able to provide detailed history about this.  He is on sodium chloride tablets 1 gram TID.   SUBJECTIVE:   Today (04/12/2020):  Brent Yu is here for for a 6 month follow up on hyponatremia. He has been trying to stick to 32 oz per day water restriction but this is difficult at times for him when he eats soup.  He has been compliant with salt tablet intake at  2 grams BID He denies any headaches, visual changes or dizziness.  He also denies nausea or diarrhea.     Stable LE edema     Weight stable   HISTORY:  Past Medical History:  Past Medical History:  Diagnosis Date   Arthritis    HANDS   Benign prostatic hyperplasia    Bladder neck contracture    Complication of anesthesia 1992   after 1992 tumor removal from stomach, my blood pressure went up high in the recovery  but ive had surgeries since then with no problems   GERD (gastroesophageal reflux disease)    Gilbert's syndrome    no significant health problems related to this issue    Hereditary and idiopathic peripheral neuropathy    History of adenomatous polyp of colon    2010 tubular adenoma/  2015 hyperplastic   History of benign gastric tumor    resection leiomyoma   History of esophagitis    Hyperlipidemia    Hypertension    Hypothyroidism    Lower urinary  tract symptoms (LUTS)    Mild persistent asthma    Urinary incontinence    Wears glasses    Wears partial dentures    UPPER   Past Surgical History:  Past Surgical History:  Procedure Laterality Date   CYSTO/  BILATERAL RETROGRADE PYELOGRAM/  BLADDER BX'S WITH FULGERATION  03/23/2005   GASTRIC RESECTION  1992   benign leiomyoma tumor   INGUINAL HERNIA REPAIR Left 2000   INGUINAL HERNIA REPAIR Right 01/04/2017   Procedure: OPEN RIGHT INGUINAL HERNIA REPAIR;  Surgeon: Luretha Murphy, MD;  Location: [facility];  Service: General;  Laterality: Right;   LUMBAR LAMINECTOMY  11/09/2015   REPAIR SUBLUXING TENDON WITH GASTROC SLIDE Right 2012   ankle   TARSAL TUNNEL RELEASE Bilateral right 08-17-2011/  left 09-11-2011   TONSILLECTOMY     TRANSURETHRAL RESECTION OF PROSTATE  03/25/2003   TURP VAPORIZATION  1999   w/ Indigo laser    Social History:  reports that he quit smoking about 57 years ago. He quit after 10.00 years of use. He has never used smokeless tobacco. He reports that he does not drink alcohol and does not use drugs. Family History:  Family History  Problem Relation Age of Onset   Cancer Mother    Stroke Father      HOME MEDICATIONS:  Allergies as of 04/12/2020      Reactions   Lisinopril Cough   Tylenol [acetaminophen] Other (See Comments)   Patient states "instructed not to take due to elevated liver enzymes"      Medication List       Accurate as of April 12, 2020  2:25 PM. If you have any questions, ask your nurse or doctor.        albuterol 108 (90 Base) MCG/ACT inhaler Commonly known as: VENTOLIN HFA Inhale 1-2 puffs into the lungs every 6 (six) hours as needed for wheezing or shortness of breath.   amLODipine 5 MG tablet Commonly known as: NORVASC Take 10 mg by mouth every evening. Taking 2 tablets once daily ( 10 MG )   CENTRUM SILVER PO Take 1 tablet by mouth daily.   fluticasone 50 MCG/ACT nasal spray Commonly known as:  FLONASE Place 1 spray into both nostrils 2 (two) times daily.   Fluticasone-Salmeterol 100-50 MCG/DOSE Aepb Commonly known as: ADVAIR Inhale 1 puff into the lungs 2 (two) times daily.   gabapentin 800 MG tablet Commonly known as: NEURONTIN Take 1 tablet (800 mg total) by mouth 3 (three) times daily.   ibuprofen 200 MG tablet Commonly known as: ADVIL Take 200 mg by mouth daily as needed for headache or moderate pain.   levocetirizine 5 MG tablet Commonly known as: XYZAL Take 5 mg by mouth daily.   levothyroxine 25 MCG tablet Commonly known as: SYNTHROID Take 25 mcg by mouth daily before breakfast.   mometasone 0.1 % ointment Commonly known as: ELOCON Apply 1 application topically daily as needed (rash).   montelukast 10 MG tablet Commonly known as: SINGULAIR Take 10 mg by mouth at bedtime.   omeprazole 20 MG tablet Commonly known as: PRILOSEC OTC Take 20 mg by mouth daily.   Probiotic Caps Take 1 capsule by mouth daily.   sildenafil 100 MG tablet Commonly known as: VIAGRA Take 100 mg by mouth as needed for erectile dysfunction.   sodium chloride 1 g tablet Take 2 tablets (2 g total) by mouth 3 (three) times daily. What changed: when to take this   telmisartan 80 MG tablet Commonly known as: MICARDIS Take 80 mg by mouth every morning.         OBJECTIVE:   PHYSICAL EXAM: VS: BP 140/70    Pulse 63    Ht 5\' 9"  (1.753 m)    Wt 161 lb (73 kg)    SpO2 97%    BMI 23.78 kg/m    EXAM: General: Pt appears well and is in NAD  Lungs: Clear with good BS bilat with no rales, rhonchi, or wheezes  Heart: Auscultation: RRR.  Abdomen: Normoactive bowel sounds, soft, nontender, without masses or organomegaly palpable  Extremities:  BL LE: No pretibial edema on right, trace edema on the left   Mental Status: Judgment, insight: Intact Mood and affect: No depression, anxiety, or agitation     DATA REVIEWED: Results for Brent Yu, Brent Yu (MRN Brent Yu) as of 04/13/2020  10:34  Ref. Range 04/12/2020 14:38  Sodium Latest Ref Range: 135 - 146 mmol/L 137  Potassium Latest Ref Range: 3.5 - 5.3 mmol/L 4.6  Chloride Latest Ref Range: 98 - 110 mmol/L 102  CO2 Latest Ref Range: 20 - 32 mmol/L 28  Glucose Latest Ref Range: 65 - 99 mg/dL 04/14/2020 (H)  BUN Latest Ref Range: 7 - 25 mg/dL 23  Creatinine Latest Ref Range: 0.70 - 1.11 mg/dL 097 (H)  Calcium  Latest Ref Range: 8.6 - 10.3 mg/dL 9.4  BUN/Creatinine Ratio Latest Ref Range: 6 - 22 (calc) 19      11/18/2019  BUN/Cr 22/1.12 GFR 62 Na 139  TSH 5.45 uIU/mL    MRI 08/10/2019 Motion degraded but no pituitary lesion   ASSESSMENT / PLAN / RECOMMENDATIONS:   1. Syndrome of Inappropriate Antidiuretic Hormone Secretion(SIADH)   - Pt continues to be asymptomatic - His serum sodium is normal at 137  - I have encouraged him to continue with fluid restriction at 32 oz daily - Brain imaging unrevealing despite motion degredation - No need to initiate lasix at this time.    Medications  Continue Sodium chloride  2 grams BID Fluid restriction at 32 oz daily     F/u in 1 yr  Signed electronically by: Lyndle Herrlich, MD  [facility]  [facility] [address] [place] [place], [place] [address] Phone: [phone] FAX: [phone]      CC: Catha Gosselin, MD [address] [place] [address] Phone: [phone]  Fax: [phone]   Return to Endocrinology clinic as below: No future appointments.

## 2020-04-13 LAB — BASIC METABOLIC PANEL
BUN/Creatinine Ratio: 19 (calc) (ref 6–22)
BUN: 23 mg/dL (ref 7–25)
CO2: 28 mmol/L (ref 20–32)
Calcium: 9.4 mg/dL (ref 8.6–10.3)
Chloride: 102 mmol/L (ref 98–110)
Creat: 1.21 mg/dL — ABNORMAL HIGH (ref 0.70–1.11)
Glucose, Bld: 103 mg/dL — ABNORMAL HIGH (ref 65–99)
Potassium: 4.6 mmol/L (ref 3.5–5.3)
Sodium: 137 mmol/L (ref 135–146)

## 2020-05-05 DIAGNOSIS — E782 Mixed hyperlipidemia: Secondary | ICD-10-CM | POA: Diagnosis not present

## 2020-05-05 DIAGNOSIS — I10 Essential (primary) hypertension: Secondary | ICD-10-CM | POA: Diagnosis not present

## 2020-05-05 DIAGNOSIS — K219 Gastro-esophageal reflux disease without esophagitis: Secondary | ICD-10-CM | POA: Diagnosis not present

## 2020-05-05 DIAGNOSIS — J45909 Unspecified asthma, uncomplicated: Secondary | ICD-10-CM | POA: Diagnosis not present

## 2020-06-06 DIAGNOSIS — H2513 Age-related nuclear cataract, bilateral: Secondary | ICD-10-CM | POA: Diagnosis not present

## 2020-07-05 DIAGNOSIS — K219 Gastro-esophageal reflux disease without esophagitis: Secondary | ICD-10-CM | POA: Diagnosis not present

## 2020-07-05 DIAGNOSIS — E782 Mixed hyperlipidemia: Secondary | ICD-10-CM | POA: Diagnosis not present

## 2020-07-05 DIAGNOSIS — E039 Hypothyroidism, unspecified: Secondary | ICD-10-CM | POA: Diagnosis not present

## 2020-07-05 DIAGNOSIS — I10 Essential (primary) hypertension: Secondary | ICD-10-CM | POA: Diagnosis not present

## 2020-07-22 DIAGNOSIS — J45909 Unspecified asthma, uncomplicated: Secondary | ICD-10-CM | POA: Diagnosis not present

## 2020-07-22 DIAGNOSIS — I10 Essential (primary) hypertension: Secondary | ICD-10-CM | POA: Diagnosis not present

## 2020-07-22 DIAGNOSIS — E782 Mixed hyperlipidemia: Secondary | ICD-10-CM | POA: Diagnosis not present

## 2020-07-22 DIAGNOSIS — E039 Hypothyroidism, unspecified: Secondary | ICD-10-CM | POA: Diagnosis not present

## 2020-08-02 DIAGNOSIS — H00021 Hordeolum internum right upper eyelid: Secondary | ICD-10-CM | POA: Diagnosis not present

## 2020-08-02 DIAGNOSIS — H0102B Squamous blepharitis left eye, upper and lower eyelids: Secondary | ICD-10-CM | POA: Diagnosis not present

## 2020-08-02 DIAGNOSIS — H0102A Squamous blepharitis right eye, upper and lower eyelids: Secondary | ICD-10-CM | POA: Diagnosis not present

## 2020-08-02 DIAGNOSIS — H25813 Combined forms of age-related cataract, bilateral: Secondary | ICD-10-CM | POA: Diagnosis not present

## 2020-09-01 DIAGNOSIS — I10 Essential (primary) hypertension: Secondary | ICD-10-CM | POA: Diagnosis not present

## 2020-09-01 DIAGNOSIS — J45909 Unspecified asthma, uncomplicated: Secondary | ICD-10-CM | POA: Diagnosis not present

## 2020-09-01 DIAGNOSIS — K219 Gastro-esophageal reflux disease without esophagitis: Secondary | ICD-10-CM | POA: Diagnosis not present

## 2020-09-01 DIAGNOSIS — E782 Mixed hyperlipidemia: Secondary | ICD-10-CM | POA: Diagnosis not present

## 2020-10-21 DIAGNOSIS — L821 Other seborrheic keratosis: Secondary | ICD-10-CM | POA: Diagnosis not present

## 2020-10-21 DIAGNOSIS — L570 Actinic keratosis: Secondary | ICD-10-CM | POA: Diagnosis not present

## 2020-10-21 DIAGNOSIS — D1801 Hemangioma of skin and subcutaneous tissue: Secondary | ICD-10-CM | POA: Diagnosis not present

## 2020-10-21 DIAGNOSIS — L814 Other melanin hyperpigmentation: Secondary | ICD-10-CM | POA: Diagnosis not present

## 2020-11-04 DIAGNOSIS — E782 Mixed hyperlipidemia: Secondary | ICD-10-CM | POA: Diagnosis not present

## 2020-11-04 DIAGNOSIS — K219 Gastro-esophageal reflux disease without esophagitis: Secondary | ICD-10-CM | POA: Diagnosis not present

## 2020-11-04 DIAGNOSIS — I10 Essential (primary) hypertension: Secondary | ICD-10-CM | POA: Diagnosis not present

## 2020-11-04 DIAGNOSIS — J45909 Unspecified asthma, uncomplicated: Secondary | ICD-10-CM | POA: Diagnosis not present

## 2020-11-25 DIAGNOSIS — J3089 Other allergic rhinitis: Secondary | ICD-10-CM | POA: Diagnosis not present

## 2020-11-25 DIAGNOSIS — J4530 Mild persistent asthma, uncomplicated: Secondary | ICD-10-CM | POA: Diagnosis not present

## 2020-11-25 DIAGNOSIS — J301 Allergic rhinitis due to pollen: Secondary | ICD-10-CM | POA: Diagnosis not present

## 2020-11-25 DIAGNOSIS — R21 Rash and other nonspecific skin eruption: Secondary | ICD-10-CM | POA: Diagnosis not present

## 2020-12-05 DIAGNOSIS — Z0000 Encounter for general adult medical examination without abnormal findings: Secondary | ICD-10-CM | POA: Diagnosis not present

## 2020-12-05 DIAGNOSIS — Z1389 Encounter for screening for other disorder: Secondary | ICD-10-CM | POA: Diagnosis not present

## 2020-12-09 DIAGNOSIS — E039 Hypothyroidism, unspecified: Secondary | ICD-10-CM | POA: Diagnosis not present

## 2020-12-09 DIAGNOSIS — E782 Mixed hyperlipidemia: Secondary | ICD-10-CM | POA: Diagnosis not present

## 2020-12-09 DIAGNOSIS — I10 Essential (primary) hypertension: Secondary | ICD-10-CM | POA: Diagnosis not present

## 2020-12-09 DIAGNOSIS — K219 Gastro-esophageal reflux disease without esophagitis: Secondary | ICD-10-CM | POA: Diagnosis not present

## 2020-12-21 DIAGNOSIS — L820 Inflamed seborrheic keratosis: Secondary | ICD-10-CM | POA: Diagnosis not present

## 2020-12-21 DIAGNOSIS — L570 Actinic keratosis: Secondary | ICD-10-CM | POA: Diagnosis not present

## 2020-12-21 DIAGNOSIS — L814 Other melanin hyperpigmentation: Secondary | ICD-10-CM | POA: Diagnosis not present

## 2020-12-21 DIAGNOSIS — L821 Other seborrheic keratosis: Secondary | ICD-10-CM | POA: Diagnosis not present

## 2021-01-20 DIAGNOSIS — L578 Other skin changes due to chronic exposure to nonionizing radiation: Secondary | ICD-10-CM | POA: Diagnosis not present

## 2021-01-20 DIAGNOSIS — L821 Other seborrheic keratosis: Secondary | ICD-10-CM | POA: Diagnosis not present

## 2021-01-20 DIAGNOSIS — L814 Other melanin hyperpigmentation: Secondary | ICD-10-CM | POA: Diagnosis not present

## 2021-01-20 DIAGNOSIS — L570 Actinic keratosis: Secondary | ICD-10-CM | POA: Diagnosis not present

## 2021-01-30 DIAGNOSIS — H0102A Squamous blepharitis right eye, upper and lower eyelids: Secondary | ICD-10-CM | POA: Diagnosis not present

## 2021-01-30 DIAGNOSIS — H25813 Combined forms of age-related cataract, bilateral: Secondary | ICD-10-CM | POA: Diagnosis not present

## 2021-01-30 DIAGNOSIS — H0102B Squamous blepharitis left eye, upper and lower eyelids: Secondary | ICD-10-CM | POA: Diagnosis not present

## 2021-02-07 DIAGNOSIS — U071 COVID-19: Secondary | ICD-10-CM | POA: Diagnosis not present

## 2021-02-07 DIAGNOSIS — Z20822 Contact with and (suspected) exposure to covid-19: Secondary | ICD-10-CM | POA: Diagnosis not present

## 2021-04-05 DIAGNOSIS — I700 Atherosclerosis of aorta: Secondary | ICD-10-CM | POA: Diagnosis not present

## 2021-04-05 DIAGNOSIS — I10 Essential (primary) hypertension: Secondary | ICD-10-CM | POA: Diagnosis not present

## 2021-04-05 DIAGNOSIS — I2510 Atherosclerotic heart disease of native coronary artery without angina pectoris: Secondary | ICD-10-CM | POA: Diagnosis not present

## 2021-04-05 DIAGNOSIS — R945 Abnormal results of liver function studies: Secondary | ICD-10-CM | POA: Diagnosis not present

## 2021-04-05 DIAGNOSIS — E7800 Pure hypercholesterolemia, unspecified: Secondary | ICD-10-CM | POA: Diagnosis not present

## 2021-04-18 ENCOUNTER — Ambulatory Visit: Payer: Medicare Other | Admitting: Internal Medicine

## 2021-04-18 ENCOUNTER — Other Ambulatory Visit: Payer: Self-pay

## 2021-04-18 ENCOUNTER — Encounter: Payer: Self-pay | Admitting: Internal Medicine

## 2021-04-18 VITALS — BP 134/82 | HR 65 | Ht 69.0 in | Wt 158.0 lb

## 2021-04-18 DIAGNOSIS — E222 Syndrome of inappropriate secretion of antidiuretic hormone: Secondary | ICD-10-CM | POA: Diagnosis not present

## 2021-04-18 DIAGNOSIS — E039 Hypothyroidism, unspecified: Secondary | ICD-10-CM

## 2021-04-18 NOTE — Progress Notes (Signed)
Name: Brent Yu  MRN/ DOB: [id number], 1932-12-04    Age/ Sex: 87 y.o., male     PCP: Brent Rua, MD   Reason for Endocrinology Evaluation: Hyponatremia     Initial Endocrinology Clinic Visit: 08/27/2018    PATIENT IDENTIFIER: Brent Yu is a 87 y.o., male with a past medical history of HTN, Asthma, GERD, hypothyroidism and dyslipidemia. He has followed with [facility] since 08/27/2018 for consultative assistance with management of his hyponatremia.   HISTORICAL SUMMARY: The patient was noted to have hyponatremia in 2018.  It seems that  at the time he may have been on some diuretic.  Patient was not able to provide detailed history about this.  He is on sodium chloride tablets 1 gram TID.   Cosyntropin stim  test 08/2018 with a baseline cortisol of 11.4 and 60 minute of 17.6 ug/dL   THYROID HISTORY : Has been diagnosed with hypothyroidism many years ago. No prior neck sx or radiation treatment        SUBJECTIVE:   Today (04/18/2021):  Brent Yu is here for for a follow up on hyponatremia. He has been doing his best in limiting daily liquid to 32 oz as much as possible. He has been compliant with salt tablet intake at  2 grams BID  Denies dizziness, headaches or vision changes   He also denies nausea or vomiting    Denies LE edema    He would like for me to take over his hypothyroid management .  He has been on LT-4 replacement for many years   HISTORY:  Past Medical History:  Past Medical History:  Diagnosis Date   Arthritis    HANDS   Benign prostatic hyperplasia    Bladder neck contracture    Complication of anesthesia 1992   after 1992 tumor removal from stomach, my blood pressure went up high in the recovery  but ive had surgeries since then with no problems   GERD (gastroesophageal reflux disease)    Gilbert's syndrome    no significant health problems related to this issue    Hereditary and idiopathic peripheral  neuropathy    History of adenomatous polyp of colon    2010 tubular adenoma/  2015 hyperplastic   History of benign gastric tumor    resection leiomyoma   History of esophagitis    Hyperlipidemia    Hypertension    Hypothyroidism    Lower urinary tract symptoms (LUTS)    Mild persistent asthma    Urinary incontinence    Wears glasses    Wears partial dentures    UPPER   Past Surgical History:  Past Surgical History:  Procedure Laterality Date   CYSTO/  BILATERAL RETROGRADE PYELOGRAM/  BLADDER BX'S WITH FULGERATION  03/23/2005   GASTRIC RESECTION  1992   benign leiomyoma tumor   INGUINAL HERNIA REPAIR Left 2000   INGUINAL HERNIA REPAIR Right 01/04/2017   Procedure: OPEN RIGHT INGUINAL HERNIA REPAIR;  Surgeon: Luretha Murphy, MD;  Location: [facility];  Service: General;  Laterality: Right;   LUMBAR LAMINECTOMY  11/09/2015   REPAIR SUBLUXING TENDON WITH GASTROC SLIDE Right 2012   ankle   TARSAL TUNNEL RELEASE Bilateral right 08-17-2011/  left 09-11-2011   TONSILLECTOMY     TRANSURETHRAL RESECTION OF PROSTATE  03/25/2003   TURP VAPORIZATION  1999   w/ Indigo laser   Social History:  reports that he quit smoking about 58 years ago. His smoking use  included cigarettes. He has never used smokeless tobacco. He reports that he does not drink alcohol and does not use drugs. Family History:  Family History  Problem Relation Age of Onset   Cancer Mother    Stroke Father      HOME MEDICATIONS: Allergies as of 04/18/2021       Reactions   Lisinopril Cough   Tylenol [acetaminophen] Other (See Comments)   Patient states "instructed not to take due to elevated liver enzymes"        Medication List        Accurate as of April 18, 2021  2:14 PM. If you have any questions, ask your nurse or doctor.          albuterol 108 (90 Base) MCG/ACT inhaler Commonly known as: VENTOLIN HFA Inhale 1-2 puffs into the lungs every 6 (six) hours as needed for wheezing or shortness of  breath.   amLODipine 5 MG tablet Commonly known as: NORVASC Take 10 mg by mouth every evening. Taking 2 tablets once daily ( 10 MG )   CENTRUM SILVER PO Take 1 tablet by mouth daily.   fluticasone 50 MCG/ACT nasal spray Commonly known as: FLONASE Place 1 spray into both nostrils 2 (two) times daily.   Fluticasone-Salmeterol 100-50 MCG/DOSE Aepb Commonly known as: ADVAIR Inhale 1 puff into the lungs 2 (two) times daily.   gabapentin 800 MG tablet Commonly known as: NEURONTIN Take 1 tablet (800 mg total) by mouth 3 (three) times daily.   ibuprofen 200 MG tablet Commonly known as: ADVIL Take 200 mg by mouth daily as needed for headache or moderate pain.   levocetirizine 5 MG tablet Commonly known as: XYZAL Take 5 mg by mouth daily.   levothyroxine 25 MCG tablet Commonly known as: SYNTHROID Take 25 mcg by mouth daily before breakfast.   mometasone 0.1 % ointment Commonly known as: ELOCON Apply 1 application topically daily as needed (rash).   montelukast 10 MG tablet Commonly known as: SINGULAIR Take 10 mg by mouth at bedtime.   omeprazole 20 MG tablet Commonly known as: PRILOSEC OTC Take 20 mg by mouth daily.   Probiotic Caps Take 1 capsule by mouth daily.   sildenafil 100 MG tablet Commonly known as: VIAGRA Take 100 mg by mouth as needed for erectile dysfunction.   sodium chloride 1 g tablet Take 2 tablets (2 g total) by mouth 3 (three) times daily. What changed: when to take this   telmisartan 80 MG tablet Commonly known as: MICARDIS Take 80 mg by mouth every morning.          OBJECTIVE:   PHYSICAL EXAM: VS: BP 134/82 (BP Location: Left Arm, Patient Position: Sitting, Cuff Size: Small)   Pulse 65   Ht 5\' 9"  (1.753 m)   Wt 158 lb (71.7 kg)   SpO2 95%   BMI 23.33 kg/m    EXAM: General: Pt appears well and is in NAD  Lungs: Clear with good BS bilat with no rales, rhonchi, or wheezes  Heart: Auscultation: RRR.  Abdomen: Normoactive bowel  sounds, soft, nontender, without masses or organomegaly palpable  Extremities:  BL LE: No pretibial edema on right, trace edema on the left   Mental Status: Judgment, insight: Intact Mood and affect: No depression, anxiety, or agitation     DATA REVIEWED: Results for Brent Yu, Brent Yu (MRN Brent Yu) as of 04/19/2021 16:24  Ref. Range 04/18/2021 14:23  Sodium Latest Ref Range: 135 - 145 mEq/L 142  Potassium Latest Ref  Range: 3.5 - 5.1 mEq/L 4.4  Chloride Latest Ref Range: 96 - 112 mEq/L 104  CO2 Latest Ref Range: 19 - 32 mEq/L 29  Glucose Latest Ref Range: 70 - 99 mg/dL 747 (H)  BUN Latest Ref Range: 6 - 23 mg/dL 25 (H)  Creatinine Latest Ref Range: 0.40 - 1.50 mg/dL 3.40  Calcium Latest Ref Range: 8.4 - 10.5 mg/dL 9.4  GFR Latest Ref Range: >60.00 mL/min 54.33 (L)  TSH Latest Ref Range: 0.35 - 5.50 uIU/mL 4.03     04/05/2021 Alp 102 AST 36 Alt 18 Tbil 1.3  LDL 178 HDL 40   MRI 08/10/2019 Motion degraded but no pituitary lesion   ASSESSMENT / PLAN / RECOMMENDATIONS:   Syndrome of Inappropriate Antidiuretic Hormone Secretion(SIADH)   - Pt continues to be asymptomatic - His serum sodium is is normal, will reduce salt tablets as below - I have encouraged him to continue with fluid restriction at 32 oz daily - Brain imaging unrevealing despite motion degradation -Urine Osm- pending  Medications  Decrease sodium chloride  2 grams every morning and 1 tablet every evening Continue fluid restriction at 32 oz daily   2. Hypothyriodism :  - Pt is clinically euthyroid  -TSH normal, no changes  Medication  Continue levothyroxine 25 mcg daily   F/u in 1 yr  Signed electronically by: Lyndle Herrlich, MD  [facility] Endocrinology  [facility] [address] [place] [place], [place] [address] Phone: [phone] FAX: [phone]      CC: Brent Rua, MD [address] [place] [address] Phone: [phone]  Fax:  [phone]   Return to Endocrinology clinic as below: No future appointments.

## 2021-04-19 LAB — BASIC METABOLIC PANEL
BUN: 25 mg/dL — ABNORMAL HIGH (ref 6–23)
CO2: 29 mEq/L (ref 19–32)
Calcium: 9.4 mg/dL (ref 8.4–10.5)
Chloride: 104 mEq/L (ref 96–112)
Creatinine, Ser: 1.21 mg/dL (ref 0.40–1.50)
GFR: 54.33 mL/min — ABNORMAL LOW (ref >60.00)
Glucose, Bld: 108 mg/dL — ABNORMAL HIGH (ref 70–99)
Potassium: 4.4 mEq/L (ref 3.5–5.1)
Sodium: 142 mEq/L (ref 135–145)

## 2021-04-19 LAB — TSH: TSH: 4.03 u[IU]/mL (ref 0.35–5.50)

## 2021-04-19 MED ORDER — LEVOTHYROXINE SODIUM 25 MCG PO TABS: 25.0000 ug | ORAL_TABLET | Freq: Every day | ORAL | 3 refills | Status: AC

## 2021-04-20 LAB — OSMOLALITY: Osmolality: 305 mOsm/kg (ref 278–305)

## 2021-04-20 LAB — OSMOLALITY, URINE: Osmolality, Ur: 875 mOsm/kg (ref 50–1200)

## 2021-05-16 ENCOUNTER — Emergency Department (HOSPITAL_COMMUNITY): Payer: Medicare Other

## 2021-05-16 ENCOUNTER — Inpatient Hospital Stay (HOSPITAL_COMMUNITY): Payer: Medicare Other

## 2021-05-16 ENCOUNTER — Inpatient Hospital Stay (HOSPITAL_COMMUNITY)
Admission: EM | Admit: 2021-05-16 | Discharge: 2021-06-18 | DRG: 870 | Disposition: E | Payer: Medicare Other | Attending: Internal Medicine | Admitting: Internal Medicine

## 2021-05-16 DIAGNOSIS — R4182 Altered mental status, unspecified: Secondary | ICD-10-CM

## 2021-05-16 DIAGNOSIS — Z452 Encounter for adjustment and management of vascular access device: Secondary | ICD-10-CM

## 2021-05-16 DIAGNOSIS — N179 Acute kidney failure, unspecified: Secondary | ICD-10-CM

## 2021-05-16 DIAGNOSIS — R0902 Hypoxemia: Secondary | ICD-10-CM

## 2021-05-16 DIAGNOSIS — R579 Shock, unspecified: Principal | ICD-10-CM

## 2021-05-16 DIAGNOSIS — R569 Unspecified convulsions: Secondary | ICD-10-CM | POA: Diagnosis not present

## 2021-05-16 DIAGNOSIS — J9601 Acute respiratory failure with hypoxia: Secondary | ICD-10-CM | POA: Diagnosis not present

## 2021-05-16 DIAGNOSIS — W19XXXA Unspecified fall, initial encounter: Secondary | ICD-10-CM

## 2021-05-16 DIAGNOSIS — J181 Lobar pneumonia, unspecified organism: Secondary | ICD-10-CM | POA: Diagnosis not present

## 2021-05-16 DIAGNOSIS — E440 Moderate protein-calorie malnutrition: Secondary | ICD-10-CM | POA: Insufficient documentation

## 2021-05-16 DIAGNOSIS — Z9289 Personal history of other medical treatment: Secondary | ICD-10-CM

## 2021-05-16 DIAGNOSIS — I4891 Unspecified atrial fibrillation: Secondary | ICD-10-CM

## 2021-05-16 DIAGNOSIS — Z4659 Encounter for fitting and adjustment of other gastrointestinal appliance and device: Secondary | ICD-10-CM

## 2021-05-16 DIAGNOSIS — J9690 Respiratory failure, unspecified, unspecified whether with hypoxia or hypercapnia: Secondary | ICD-10-CM

## 2021-05-16 DIAGNOSIS — J189 Pneumonia, unspecified organism: Secondary | ICD-10-CM

## 2021-05-16 DIAGNOSIS — R069 Unspecified abnormalities of breathing: Secondary | ICD-10-CM

## 2021-05-16 DIAGNOSIS — I480 Paroxysmal atrial fibrillation: Secondary | ICD-10-CM | POA: Diagnosis present

## 2021-05-16 DIAGNOSIS — Z6827 Body mass index (BMI) 27.0-27.9, adult: Secondary | ICD-10-CM

## 2021-05-16 DIAGNOSIS — M47812 Spondylosis without myelopathy or radiculopathy, cervical region: Secondary | ICD-10-CM | POA: Diagnosis not present

## 2021-05-16 DIAGNOSIS — R4020 Unspecified coma: Secondary | ICD-10-CM | POA: Diagnosis present

## 2021-05-16 DIAGNOSIS — R000 Tachycardia, unspecified: Secondary | ICD-10-CM | POA: Diagnosis not present

## 2021-05-16 DIAGNOSIS — W06XXXA Fall from bed, initial encounter: Secondary | ICD-10-CM | POA: Diagnosis present

## 2021-05-16 DIAGNOSIS — E8770 Fluid overload, unspecified: Secondary | ICD-10-CM | POA: Diagnosis not present

## 2021-05-16 DIAGNOSIS — Z79899 Other long term (current) drug therapy: Secondary | ICD-10-CM

## 2021-05-16 DIAGNOSIS — B9620 Unspecified Escherichia coli [E. coli] as the cause of diseases classified elsewhere: Secondary | ICD-10-CM | POA: Diagnosis present

## 2021-05-16 DIAGNOSIS — J15211 Pneumonia due to Methicillin susceptible Staphylococcus aureus: Secondary | ICD-10-CM | POA: Diagnosis present

## 2021-05-16 DIAGNOSIS — I10 Essential (primary) hypertension: Secondary | ICD-10-CM | POA: Diagnosis present

## 2021-05-16 DIAGNOSIS — J9811 Atelectasis: Secondary | ICD-10-CM | POA: Diagnosis not present

## 2021-05-16 DIAGNOSIS — I959 Hypotension, unspecified: Secondary | ICD-10-CM | POA: Diagnosis not present

## 2021-05-16 DIAGNOSIS — Z86010 Personal history of colonic polyps: Secondary | ICD-10-CM | POA: Diagnosis not present

## 2021-05-16 DIAGNOSIS — Z79890 Hormone replacement therapy (postmenopausal): Secondary | ICD-10-CM

## 2021-05-16 DIAGNOSIS — Z20822 Contact with and (suspected) exposure to covid-19: Secondary | ICD-10-CM | POA: Diagnosis not present

## 2021-05-16 DIAGNOSIS — J9600 Acute respiratory failure, unspecified whether with hypoxia or hypercapnia: Secondary | ICD-10-CM | POA: Diagnosis not present

## 2021-05-16 DIAGNOSIS — R739 Hyperglycemia, unspecified: Secondary | ICD-10-CM | POA: Diagnosis not present

## 2021-05-16 DIAGNOSIS — R778 Other specified abnormalities of plasma proteins: Secondary | ICD-10-CM | POA: Diagnosis not present

## 2021-05-16 DIAGNOSIS — Z87891 Personal history of nicotine dependence: Secondary | ICD-10-CM | POA: Diagnosis not present

## 2021-05-16 DIAGNOSIS — J14 Pneumonia due to Hemophilus influenzae: Secondary | ICD-10-CM | POA: Diagnosis present

## 2021-05-16 DIAGNOSIS — J690 Pneumonitis due to inhalation of food and vomit: Secondary | ICD-10-CM | POA: Diagnosis present

## 2021-05-16 DIAGNOSIS — R6521 Severe sepsis with septic shock: Secondary | ICD-10-CM | POA: Diagnosis not present

## 2021-05-16 DIAGNOSIS — M4313 Spondylolisthesis, cervicothoracic region: Secondary | ICD-10-CM | POA: Diagnosis not present

## 2021-05-16 DIAGNOSIS — K219 Gastro-esophageal reflux disease without esophagitis: Secondary | ICD-10-CM | POA: Diagnosis present

## 2021-05-16 DIAGNOSIS — Z7189 Other specified counseling: Secondary | ICD-10-CM | POA: Diagnosis not present

## 2021-05-16 DIAGNOSIS — N400 Enlarged prostate without lower urinary tract symptoms: Secondary | ICD-10-CM | POA: Diagnosis present

## 2021-05-16 DIAGNOSIS — Z515 Encounter for palliative care: Secondary | ICD-10-CM | POA: Diagnosis not present

## 2021-05-16 DIAGNOSIS — Z888 Allergy status to other drugs, medicaments and biological substances status: Secondary | ICD-10-CM

## 2021-05-16 DIAGNOSIS — Z4682 Encounter for fitting and adjustment of non-vascular catheter: Secondary | ICD-10-CM | POA: Diagnosis not present

## 2021-05-16 DIAGNOSIS — A419 Sepsis, unspecified organism: Principal | ICD-10-CM | POA: Diagnosis present

## 2021-05-16 DIAGNOSIS — N170 Acute kidney failure with tubular necrosis: Secondary | ICD-10-CM | POA: Diagnosis present

## 2021-05-16 DIAGNOSIS — J4530 Mild persistent asthma, uncomplicated: Secondary | ICD-10-CM | POA: Diagnosis present

## 2021-05-16 DIAGNOSIS — D638 Anemia in other chronic diseases classified elsewhere: Secondary | ICD-10-CM | POA: Diagnosis present

## 2021-05-16 DIAGNOSIS — J849 Interstitial pulmonary disease, unspecified: Secondary | ICD-10-CM | POA: Diagnosis not present

## 2021-05-16 DIAGNOSIS — E781 Pure hyperglyceridemia: Secondary | ICD-10-CM | POA: Diagnosis present

## 2021-05-16 DIAGNOSIS — E039 Hypothyroidism, unspecified: Secondary | ICD-10-CM | POA: Diagnosis present

## 2021-05-16 DIAGNOSIS — R918 Other nonspecific abnormal finding of lung field: Secondary | ICD-10-CM | POA: Diagnosis not present

## 2021-05-16 DIAGNOSIS — J1108 Influenza due to unidentified influenza virus with specified pneumonia: Secondary | ICD-10-CM | POA: Diagnosis present

## 2021-05-16 DIAGNOSIS — G609 Hereditary and idiopathic neuropathy, unspecified: Secondary | ICD-10-CM | POA: Diagnosis present

## 2021-05-16 DIAGNOSIS — S3993XA Unspecified injury of pelvis, initial encounter: Secondary | ICD-10-CM | POA: Diagnosis not present

## 2021-05-16 DIAGNOSIS — Z66 Do not resuscitate: Secondary | ICD-10-CM | POA: Diagnosis not present

## 2021-05-16 DIAGNOSIS — Z781 Physical restraint status: Secondary | ICD-10-CM

## 2021-05-16 DIAGNOSIS — R9082 White matter disease, unspecified: Secondary | ICD-10-CM | POA: Diagnosis not present

## 2021-05-16 DIAGNOSIS — E222 Syndrome of inappropriate secretion of antidiuretic hormone: Secondary | ICD-10-CM | POA: Diagnosis present

## 2021-05-16 DIAGNOSIS — R54 Age-related physical debility: Secondary | ICD-10-CM | POA: Diagnosis present

## 2021-05-16 DIAGNOSIS — G8929 Other chronic pain: Secondary | ICD-10-CM | POA: Diagnosis present

## 2021-05-16 DIAGNOSIS — G928 Other toxic encephalopathy: Secondary | ICD-10-CM | POA: Diagnosis present

## 2021-05-16 DIAGNOSIS — R319 Hematuria, unspecified: Secondary | ICD-10-CM | POA: Diagnosis present

## 2021-05-16 DIAGNOSIS — J984 Other disorders of lung: Secondary | ICD-10-CM | POA: Diagnosis not present

## 2021-05-16 DIAGNOSIS — Z823 Family history of stroke: Secondary | ICD-10-CM

## 2021-05-16 DIAGNOSIS — J9621 Acute and chronic respiratory failure with hypoxia: Secondary | ICD-10-CM | POA: Diagnosis not present

## 2021-05-16 DIAGNOSIS — Z7951 Long term (current) use of inhaled steroids: Secondary | ICD-10-CM

## 2021-05-16 DIAGNOSIS — E875 Hyperkalemia: Secondary | ICD-10-CM | POA: Diagnosis present

## 2021-05-16 DIAGNOSIS — J9602 Acute respiratory failure with hypercapnia: Secondary | ICD-10-CM | POA: Diagnosis not present

## 2021-05-16 LAB — CBC WITH DIFFERENTIAL/PLATELET
Abs Immature Granulocytes: 0.08 10*3/uL — ABNORMAL HIGH (ref 0.00–0.07)
Basophils Absolute: 0 10*3/uL (ref 0.0–0.1)
Basophils Relative: 0 %
Eosinophils Absolute: 0 10*3/uL (ref 0.0–0.5)
Eosinophils Relative: 0 %
HCT: 37.1 % — ABNORMAL LOW (ref 39.0–52.0)
Hemoglobin: 12 g/dL — ABNORMAL LOW (ref 13.0–17.0)
Immature Granulocytes: 1 %
Lymphocytes Relative: 14 %
Lymphs Abs: 2.3 10*3/uL (ref 0.7–4.0)
MCH: 32.3 pg (ref 26.0–34.0)
MCHC: 32.3 g/dL (ref 30.0–36.0)
MCV: 100 fL (ref 80.0–100.0)
Monocytes Absolute: 1.5 10*3/uL — ABNORMAL HIGH (ref 0.1–1.0)
Monocytes Relative: 9 %
Neutro Abs: 13.1 10*3/uL — ABNORMAL HIGH (ref 1.7–7.7)
Neutrophils Relative %: 76 %
Platelets: 141 10*3/uL — ABNORMAL LOW (ref 150–400)
RBC: 3.71 MIL/uL — ABNORMAL LOW (ref 4.22–5.81)
RDW: 13.1 % (ref 11.5–15.5)
WBC: 17.1 10*3/uL — ABNORMAL HIGH (ref 4.0–10.5)
nRBC: 0 % (ref 0.0–0.2)

## 2021-05-16 LAB — URINALYSIS, ROUTINE W REFLEX MICROSCOPIC
APPearance: UNDETERMINED
Bilirubin Urine: UNDETERMINED
Bilirubin Urine: NEGATIVE
Color, Urine: UNDETERMINED
Glucose, UA: UNDETERMINED mg/dL
Glucose, UA: NEGATIVE mg/dL
Hgb urine dipstick: UNDETERMINED
Ketones, ur: UNDETERMINED mg/dL
Ketones, ur: NEGATIVE mg/dL
Leukocytes,Ua: UNDETERMINED
Leukocytes,Ua: NEGATIVE
Nitrite: UNDETERMINED
Nitrite: NEGATIVE
Protein, ur: UNDETERMINED mg/dL
Protein, ur: 100 mg/dL — AB
Specific Gravity, Urine: UNDETERMINED (ref 1.005–1.030)
Specific Gravity, Urine: 1.01 (ref 1.005–1.030)
pH: UNDETERMINED (ref 5.0–8.0)
pH: 5.5 (ref 5.0–8.0)

## 2021-05-16 LAB — I-STAT ARTERIAL BLOOD GAS, ED
Acid-base deficit: 6 mmol/L — ABNORMAL HIGH (ref 0.0–2.0)
Acid-base deficit: 5 mmol/L — ABNORMAL HIGH (ref 0.0–2.0)
Bicarbonate: 20.6 mmol/L (ref 20.0–28.0)
Bicarbonate: 21.1 mmol/L (ref 20.0–28.0)
Calcium, Ion: 1.18 mmol/L (ref 1.15–1.40)
Calcium, Ion: 1.19 mmol/L (ref 1.15–1.40)
HCT: 32 % — ABNORMAL LOW (ref 39.0–52.0)
HCT: 40 % (ref 39.0–52.0)
Hemoglobin: 10.9 g/dL — ABNORMAL LOW (ref 13.0–17.0)
Hemoglobin: 13.6 g/dL (ref 13.0–17.0)
O2 Saturation: 98 %
O2 Saturation: 89 %
Patient temperature: 97.2
Patient temperature: 97.2
Potassium: 3.6 mmol/L (ref 3.5–5.1)
Potassium: 4.2 mmol/L (ref 3.5–5.1)
Sodium: 141 mmol/L (ref 135–145)
Sodium: 140 mmol/L (ref 135–145)
TCO2: 22 mmol/L (ref 22–32)
TCO2: 22 mmol/L (ref 22–32)
pCO2 arterial: 44.3 mmHg (ref 32.0–48.0)
pCO2 arterial: 39.1 mmHg (ref 32.0–48.0)
pH, Arterial: 7.271 — ABNORMAL LOW (ref 7.350–7.450)
pH, Arterial: 7.336 — ABNORMAL LOW (ref 7.350–7.450)
pO2, Arterial: 122 mmHg — ABNORMAL HIGH (ref 83.0–108.0)
pO2, Arterial: 58 mmHg — ABNORMAL LOW (ref 83.0–108.0)

## 2021-05-16 LAB — COMPREHENSIVE METABOLIC PANEL
ALT: 22 U/L (ref 0–44)
AST: 47 U/L — ABNORMAL HIGH (ref 15–41)
Albumin: 2.5 g/dL — ABNORMAL LOW (ref 3.5–5.0)
Alkaline Phosphatase: 53 U/L (ref 38–126)
Anion gap: 11 (ref 5–15)
BUN: 48 mg/dL — ABNORMAL HIGH (ref 8–23)
CO2: 20 mmol/L — ABNORMAL LOW (ref 22–32)
Calcium: 8.5 mg/dL — ABNORMAL LOW (ref 8.9–10.3)
Chloride: 108 mmol/L (ref 98–111)
Creatinine, Ser: 3.7 mg/dL — ABNORMAL HIGH (ref 0.61–1.24)
GFR, Estimated: 15 mL/min — ABNORMAL LOW (ref >60)
Glucose, Bld: 100 mg/dL — ABNORMAL HIGH (ref 70–99)
Potassium: 4.2 mmol/L (ref 3.5–5.1)
Sodium: 139 mmol/L (ref 135–145)
Total Bilirubin: 2.2 mg/dL — ABNORMAL HIGH (ref 0.3–1.2)
Total Protein: 6 g/dL — ABNORMAL LOW (ref 6.5–8.1)

## 2021-05-16 LAB — GLUCOSE, CAPILLARY
Glucose-Capillary: 115 mg/dL — ABNORMAL HIGH (ref 70–99)
Glucose-Capillary: 104 mg/dL — ABNORMAL HIGH (ref 70–99)
Glucose-Capillary: 154 mg/dL — ABNORMAL HIGH (ref 70–99)
Glucose-Capillary: 125 mg/dL — ABNORMAL HIGH (ref 70–99)
Glucose-Capillary: 117 mg/dL — ABNORMAL HIGH (ref 70–99)

## 2021-05-16 LAB — URINALYSIS, MICROSCOPIC (REFLEX)
Amorphous Crystal: UNDETERMINED
Bacteria, UA: UNDETERMINED
Bacteria, UA: NONE SEEN
Broad Casts, UA: UNDETERMINED
Budding Yeast: UNDETERMINED
Ca Carbonate Crystal: UNDETERMINED
Ca Oxalate Crys, UA: UNDETERMINED
Ca Phos Crys, UA: UNDETERMINED
Cellular Cast, UA: UNDETERMINED
Crystals: UNDETERMINED
Cystine Crys, UA: UNDETERMINED
Epithelial Casts, UA: UNDETERMINED
Fat - U1-Fat: UNDETERMINED
Fatty Casts, UA: UNDETERMINED
Granular Casts, UA: UNDETERMINED
Hyaline Casts, UA: UNDETERMINED
Hyphae Yeast: UNDETERMINED
Leucine Crys, UA: UNDETERMINED
Mucus: UNDETERMINED
Non Squamous Epithelial: UNDETERMINED
Oval Fat Body: UNDETERMINED
RBC / HPF: UNDETERMINED RBC/hpf (ref 0–5)
RBC Casts, UA: UNDETERMINED
Sperm, UA: UNDETERMINED
Squamous Epithelial / HPF: UNDETERMINED (ref 0–5)
Trichomonas, UA: UNDETERMINED
Triple Phosphate Crystal: UNDETERMINED
Tyrosine Crys, UA: UNDETERMINED
Uric Acid Crys, UA: UNDETERMINED
Urine-Other: UNDETERMINED
WBC Casts, UA: UNDETERMINED
WBC Clumps: UNDETERMINED
WBC, UA: UNDETERMINED WBC/hpf (ref 0–5)
Waxy Casts, UA: UNDETERMINED

## 2021-05-16 LAB — ETHANOL: Alcohol, Ethyl (B): <10 mg/dL (ref <10)

## 2021-05-16 LAB — APTT: aPTT: 22 seconds — ABNORMAL LOW (ref 24–36)

## 2021-05-16 LAB — TROPONIN I (HIGH SENSITIVITY)
Troponin I (High Sensitivity): 41 ng/L — ABNORMAL HIGH (ref <18)
Troponin I (High Sensitivity): 82 ng/L — ABNORMAL HIGH (ref <18)

## 2021-05-16 LAB — AMYLASE: Amylase: 52 U/L (ref 28–100)

## 2021-05-16 LAB — TSH: TSH: 3.179 u[IU]/mL (ref 0.350–4.500)

## 2021-05-16 LAB — I-STAT CHEM 8, ED
BUN: 45 mg/dL — ABNORMAL HIGH (ref 8–23)
Calcium, Ion: 1.07 mmol/L — ABNORMAL LOW (ref 1.15–1.40)
Chloride: 109 mmol/L (ref 98–111)
Creatinine, Ser: 3.7 mg/dL — ABNORMAL HIGH (ref 0.61–1.24)
Glucose, Bld: 97 mg/dL (ref 70–99)
HCT: 35 % — ABNORMAL LOW (ref 39.0–52.0)
Hemoglobin: 11.9 g/dL — ABNORMAL LOW (ref 13.0–17.0)
Potassium: 4.1 mmol/L (ref 3.5–5.1)
Sodium: 140 mmol/L (ref 135–145)
TCO2: 21 mmol/L — ABNORMAL LOW (ref 22–32)

## 2021-05-16 LAB — PHOSPHORUS: Phosphorus: 1.5 mg/dL — ABNORMAL LOW (ref 2.5–4.6)

## 2021-05-16 LAB — RAPID URINE DRUG SCREEN, HOSP PERFORMED
Amphetamines: NOT DETECTED
Barbiturates: NOT DETECTED
Benzodiazepines: NOT DETECTED
Cocaine: NOT DETECTED
Opiates: NOT DETECTED
Tetrahydrocannabinol: NOT DETECTED

## 2021-05-16 LAB — HEMOGLOBIN AND HEMATOCRIT, BLOOD
HCT: 37.9 % — ABNORMAL LOW (ref 39.0–52.0)
Hemoglobin: 12.4 g/dL — ABNORMAL LOW (ref 13.0–17.0)

## 2021-05-16 LAB — STREP PNEUMONIAE URINARY ANTIGEN: Strep Pneumo Urinary Antigen: NEGATIVE

## 2021-05-16 LAB — LACTIC ACID, PLASMA
Lactic Acid, Venous: 3.6 mmol/L (ref 0.5–1.9)
Lactic Acid, Venous: 3.1 mmol/L (ref 0.5–1.9)
Lactic Acid, Venous: 2.8 mmol/L (ref 0.5–1.9)

## 2021-05-16 LAB — PROCALCITONIN: Procalcitonin: 44.02 ng/mL

## 2021-05-16 LAB — MAGNESIUM: Magnesium: 1.4 mg/dL — ABNORMAL LOW (ref 1.7–2.4)

## 2021-05-16 LAB — SAMPLE TO BLOOD BANK

## 2021-05-16 LAB — LIPASE, BLOOD: Lipase: 37 U/L (ref 11–51)

## 2021-05-16 LAB — RESP PANEL BY RT-PCR (FLU A&B, COVID) ARPGX2
Influenza A by PCR: NEGATIVE
Influenza B by PCR: NEGATIVE
SARS Coronavirus 2 by RT PCR: NEGATIVE

## 2021-05-16 LAB — PROTIME-INR
INR: 1.4 — ABNORMAL HIGH (ref 0.8–1.2)
Prothrombin Time: 16.7 seconds — ABNORMAL HIGH (ref 11.4–15.2)

## 2021-05-16 LAB — CORTISOL: Cortisol, Plasma: 23.2 ug/dL

## 2021-05-16 LAB — MRSA NEXT GEN BY PCR, NASAL: MRSA by PCR Next Gen: NOT DETECTED

## 2021-05-16 MED ORDER — NOREPINEPHRINE 4 MG/250ML-% IV SOLN
2.0000 ug/min | INTRAVENOUS | Status: DC
Start: 2021-05-16 — End: 2021-05-16
  Administered 2021-05-16: 2 ug/min via INTRAVENOUS

## 2021-05-16 MED ORDER — FENTANYL BOLUS VIA INFUSION
25.0000 ug | INTRAVENOUS | Status: DC | PRN
  Administered 2021-05-16: 100 ug via INTRAVENOUS
  Administered 2021-05-17 (×4): 50 ug via INTRAVENOUS
  Administered 2021-05-17: 75 ug via INTRAVENOUS
  Administered 2021-05-18: 100 ug via INTRAVENOUS
  Administered 2021-05-18: 75 ug via INTRAVENOUS
  Administered 2021-05-18: 50 ug via INTRAVENOUS
  Administered 2021-05-19: 100 ug via INTRAVENOUS
  Administered 2021-05-19: 50 ug via INTRAVENOUS
  Administered 2021-05-19 – 2021-05-20 (×4): 100 ug via INTRAVENOUS
  Administered 2021-05-20: 50 ug via INTRAVENOUS
  Administered 2021-05-20: 100 ug via INTRAVENOUS
  Administered 2021-05-20: 50 ug via INTRAVENOUS
  Administered 2021-05-21: 100 ug via INTRAVENOUS
  Administered 2021-05-21: 50 ug via INTRAVENOUS
  Administered 2021-05-21 (×3): 100 ug via INTRAVENOUS
  Administered 2021-05-21: 50 ug via INTRAVENOUS
  Administered 2021-05-21 – 2021-05-22 (×2): 100 ug via INTRAVENOUS
  Administered 2021-05-22 – 2021-05-23 (×6): 50 ug via INTRAVENOUS
  Administered 2021-05-23: 100 ug via INTRAVENOUS
  Administered 2021-05-23: 50 ug via INTRAVENOUS
  Administered 2021-05-23: 100 ug via INTRAVENOUS
  Administered 2021-05-23 – 2021-05-25 (×2): 50 ug via INTRAVENOUS
  Administered 2021-05-26: 25 ug via INTRAVENOUS
  Administered 2021-05-26 – 2021-05-27 (×5): 50 ug via INTRAVENOUS
  Administered 2021-05-27: 25 ug via INTRAVENOUS
  Administered 2021-05-27 – 2021-05-28 (×2): 50 ug via INTRAVENOUS
  Administered 2021-05-28: 100 ug via INTRAVENOUS
  Administered 2021-05-28 (×3): 50 ug via INTRAVENOUS
  Filled 2021-05-16: qty 100

## 2021-05-16 MED ORDER — VASOPRESSIN 20 UNITS/100 ML INFUSION FOR SHOCK
0.0000 [IU]/min | INTRAVENOUS | Status: DC
  Administered 2021-05-16 – 2021-05-18 (×5): 0.03 [IU]/min via INTRAVENOUS
  Administered 2021-05-19 – 2021-05-27 (×21): 0.04 [IU]/min via INTRAVENOUS
  Filled 2021-05-16 (×26): qty 100

## 2021-05-16 MED ORDER — VASOPRESSIN 20 UNITS/100 ML INFUSION FOR SHOCK: 0.0000 [IU]/min | INTRAVENOUS | Status: DC

## 2021-05-16 MED ORDER — IPRATROPIUM-ALBUTEROL 0.5-2.5 (3) MG/3ML IN SOLN: 3.0000 mL | RESPIRATORY_TRACT | Status: DC | PRN

## 2021-05-16 MED ORDER — SODIUM CHLORIDE 0.9 % IV BOLUS
1000.0000 mL | Freq: Once | INTRAVENOUS | Status: AC
  Administered 2021-05-16: 1000 mL via INTRAVENOUS

## 2021-05-16 MED ORDER — METHYLPREDNISOLONE SODIUM SUCC 125 MG IJ SOLR
125.0000 mg | Freq: Once | INTRAMUSCULAR | Status: AC
  Administered 2021-05-16: 125 mg via INTRAVENOUS

## 2021-05-16 MED ORDER — ETOMIDATE 2 MG/ML IV SOLN
INTRAVENOUS | Status: DC | PRN
  Administered 2021-05-16: 20 mg via INTRAVENOUS

## 2021-05-16 MED ORDER — NOREPINEPHRINE 4 MG/250ML-% IV SOLN
0.0000 ug/min | INTRAVENOUS | Status: DC
  Administered 2021-05-16: 18 ug/min via INTRAVENOUS
  Administered 2021-05-16: 15 ug/min via INTRAVENOUS
  Administered 2021-05-16: 22 ug/min via INTRAVENOUS

## 2021-05-16 MED ORDER — PROSOURCE TF PO LIQD: 45.0000 mL | Freq: Two times a day (BID) | ORAL | Status: DC

## 2021-05-16 MED ORDER — EPINEPHRINE HCL 5 MG/250ML IV SOLN IN NS: 0.0000 ug/min | INTRAVENOUS | Status: DC

## 2021-05-16 MED ORDER — HEPARIN SODIUM (PORCINE) 5000 UNIT/ML IJ SOLN
5000.0000 [IU] | Freq: Three times a day (TID) | INTRAMUSCULAR | Status: DC
  Administered 2021-05-16 – 2021-05-28 (×37): 5000 [IU] via SUBCUTANEOUS
  Filled 2021-05-16 (×33): qty 1

## 2021-05-16 MED ORDER — IOHEXOL 350 MG/ML SOLN: 100.0000 mL | Freq: Once | INTRAVENOUS | Status: DC | PRN

## 2021-05-16 MED ORDER — FENTANYL 2500MCG IN NS 250ML (10MCG/ML) PREMIX INFUSION
0.0000 ug/h | Freq: Once | INTRAVENOUS | Status: AC
  Administered 2021-05-16: 25 ug/h via INTRAVENOUS

## 2021-05-16 MED ORDER — SODIUM CHLORIDE 0.9 % IV SOLN
2.0000 g | Freq: Once | INTRAVENOUS | Status: AC
  Administered 2021-05-16: 2 g via INTRAVENOUS

## 2021-05-16 MED ORDER — SODIUM CHLORIDE 0.9 % IV SOLN
250.0000 mL | INTRAVENOUS | Status: DC
  Administered 2021-05-16 (×2): 250 mL via INTRAVENOUS
  Administered 2021-05-19: 500 mL via INTRAVENOUS

## 2021-05-16 MED ORDER — NOREPINEPHRINE 16 MG/250ML-% IV SOLN
0.0000 ug/min | INTRAVENOUS | Status: DC
  Administered 2021-05-16: 19 ug/min via INTRAVENOUS
  Administered 2021-05-16: 2 ug/min via INTRAVENOUS
  Administered 2021-05-17: 13 ug/min via INTRAVENOUS
  Administered 2021-05-18: 18 ug/min via INTRAVENOUS
  Administered 2021-05-19: 19.2 ug/min via INTRAVENOUS
  Administered 2021-05-20: 6 ug/min via INTRAVENOUS
  Administered 2021-05-22: 4 ug/min via INTRAVENOUS
  Administered 2021-05-23: 5 ug/min via INTRAVENOUS
  Administered 2021-05-25: 18 ug/min via INTRAVENOUS
  Administered 2021-05-26: 20 ug/min via INTRAVENOUS
  Administered 2021-05-27: 16 ug/min via INTRAVENOUS
  Administered 2021-05-27: 33 ug/min via INTRAVENOUS
  Filled 2021-05-16 (×9): qty 250

## 2021-05-16 MED ORDER — LORAZEPAM 2 MG/ML IJ SOLN
2.0000 mg | Freq: Once | INTRAMUSCULAR | Status: AC
  Administered 2021-05-16: 2 mg via INTRAVENOUS

## 2021-05-16 MED ORDER — DOCUSATE SODIUM 50 MG/5ML PO LIQD
100.0000 mg | Freq: Two times a day (BID) | ORAL | Status: DC | PRN
  Administered 2021-05-21 – 2021-05-25 (×3): 100 mg
  Filled 2021-05-16 (×3): qty 10

## 2021-05-16 MED ORDER — LACTATED RINGERS IV SOLN: INTRAVENOUS | Status: DC

## 2021-05-16 MED ORDER — EPINEPHRINE HCL 5 MG/250ML IV SOLN IN NS: 0.5000 ug/min | INTRAVENOUS | Status: DC

## 2021-05-16 MED ORDER — SODIUM CHLORIDE 0.9 % IV SOLN: INTRAVENOUS | Status: DC

## 2021-05-16 MED ORDER — CHLORHEXIDINE GLUCONATE CLOTH 2 % EX PADS
6.0000 | MEDICATED_PAD | Freq: Every day | CUTANEOUS | Status: DC
  Administered 2021-05-16: 6 via TOPICAL

## 2021-05-16 MED ORDER — PANTOPRAZOLE SODIUM 40 MG IV SOLR
40.0000 mg | Freq: Every day | INTRAVENOUS | Status: DC
  Administered 2021-05-16 – 2021-05-21 (×6): 40 mg via INTRAVENOUS
  Filled 2021-05-16 (×5): qty 40

## 2021-05-16 MED ORDER — ORAL CARE MOUTH RINSE
15.0000 mL | OROMUCOSAL | Status: DC
  Administered 2021-05-16 – 2021-05-28 (×116): 15 mL via OROMUCOSAL

## 2021-05-16 MED ORDER — FENTANYL CITRATE PF 50 MCG/ML IJ SOSY: 25.0000 ug | PREFILLED_SYRINGE | Freq: Once | INTRAMUSCULAR | Status: DC

## 2021-05-16 MED ORDER — SODIUM CHLORIDE 0.9 % IV SOLN: INTRAVENOUS | Status: DC | PRN

## 2021-05-16 MED ORDER — POLYETHYLENE GLYCOL 3350 17 G PO PACK: 17.0000 g | PACK | Freq: Every day | ORAL | Status: DC | PRN

## 2021-05-16 MED ORDER — IOHEXOL 350 MG/ML SOLN
100.0000 mL | Freq: Once | INTRAVENOUS | Status: AC | PRN
  Administered 2021-05-16: 100 mL via INTRAVENOUS

## 2021-05-16 MED ORDER — SODIUM CHLORIDE 0.9 % IV SOLN
INTRAVENOUS | Status: AC | PRN
  Administered 2021-05-16: 1000 mL via INTRAVENOUS

## 2021-05-16 MED ORDER — CHLORHEXIDINE GLUCONATE 0.12% ORAL RINSE (MEDLINE KIT)
15.0000 mL | Freq: Two times a day (BID) | OROMUCOSAL | Status: DC
  Administered 2021-05-16 – 2021-05-28 (×25): 15 mL via OROMUCOSAL

## 2021-05-16 MED ORDER — MAGNESIUM SULFATE 2 GM/50ML IV SOLN
2.0000 g | Freq: Once | INTRAVENOUS | Status: AC
  Administered 2021-05-16: 2 g via INTRAVENOUS

## 2021-05-16 MED ORDER — SODIUM CHLORIDE 0.9 % IV SOLN
500.0000 mg | INTRAVENOUS | Status: AC
  Administered 2021-05-16 – 2021-05-20 (×5): 500 mg via INTRAVENOUS
  Filled 2021-05-16 (×5): qty 500

## 2021-05-16 MED ORDER — ROCURONIUM BROMIDE 50 MG/5ML IV SOLN
INTRAVENOUS | Status: DC | PRN
  Administered 2021-05-16: 100 mg via INTRAVENOUS

## 2021-05-16 MED ORDER — VITAL AF 1.2 CAL PO LIQD
1000.0000 mL | ORAL | Status: DC
  Administered 2021-05-16 – 2021-05-25 (×13): 1000 mL
  Filled 2021-05-16 (×5): qty 1000

## 2021-05-16 MED ORDER — FENTANYL 2500MCG IN NS 250ML (10MCG/ML) PREMIX INFUSION
25.0000 ug/h | INTRAVENOUS | Status: DC
  Administered 2021-05-16: 50 ug/h via INTRAVENOUS
  Administered 2021-05-17: 175 ug/h via INTRAVENOUS
  Administered 2021-05-18 – 2021-05-19 (×3): 150 ug/h via INTRAVENOUS
  Administered 2021-05-20: 200 ug/h via INTRAVENOUS
  Filled 2021-05-16 (×5): qty 250

## 2021-05-16 NOTE — ED Notes (Signed)
Jackelyn Hoehn, ED tech at bedside collecting lab work.

## 2021-05-16 NOTE — ED Notes (Signed)
This RN was told in report that blood cultures were already collected. Another staff member reported that the blood cultures were coagulated. This RN called lab to verify that they had the cultures and that nothing was wrong with the cultures that required a redraw. Per lab, they didn't receive cultures. Cultures were not completed as collected in chart. D/t this RN not being aware that cultures were not collected d/t report, abx were initiated prior to culture collection.

## 2021-05-16 NOTE — ED Provider Notes (Signed)
[facility] Emergency Department Provider Note MRN:  [id number]  Arrival date & time: 04/21/2021     Chief Complaint   Fall History of Present Illness   Brent Yu is a 87 y.o. year-old male with a history of hypertension, SIADH presenting to the ED with chief complaint of fall.  Patient presenting as a level 1 trauma, reportedly fell in the bedroom and hit his head on the nightstand.  EMS found him to be hypotensive with blood pressure 50/30.  Hypoxic in the 70s on room air.  Altered mental status.  Evidence of head trauma.  Not on blood thinners.  I was unable to obtain an accurate HPI, PMH, or ROS due to the patient's altered mental status.  Level 5 caveat.  Review of Systems  Positive for fall, altered mental status, hypotension, hypoxia.  Patient's Health History   No past medical history on file.    No family history on file.  Social History   Socioeconomic History   Marital status: Married    Spouse name: Not on file   Number of children: Not on file   Years of education: Not on file   Highest education level: Not on file  Occupational History   Not on file  Tobacco Use   Smoking status: Not on file   Smokeless tobacco: Not on file  Substance and Sexual Activity   Alcohol use: Not on file   Drug use: Not on file   Sexual activity: Not on file  Other Topics Concern   Not on file  Social History Narrative   Not on file   Social Determinants of Health   Financial Resource Strain: Not on file  Food Insecurity: Not on file  Transportation Needs: Not on file  Physical Activity: Not on file  Stress: Not on file  Social Connections: Not on file  Intimate Partner Violence: Not on file     Physical Exam   Vitals:   04/19/2021 0708 05/04/2021 0714  BP:  135/76  Pulse:  (!) 117  Resp:  18  Temp:  (!) 97.2 F (36.2 C)  SpO2: 99% 99%    CONSTITUTIONAL: Ill-appearing, NAD NEURO: Somnolent, wakes to voice, follows commands, oriented  only to name, moves all extremities EYES:  eyes equal and reactive ENT/NECK:  no LAD, no JVD CARDIO: Tachycardic rate, well-perfused, normal S1 and S2 PULM: Diffuse rhonchi GI/GU:  normal bowel sounds, non-distended, non-tender MSK/SPINE:  No gross deformities, no edema SKIN: Blood from the nares PSYCH: Unable to assess  *Additional and/or pertinent findings included in MDM below  Diagnostic and Interventional Summary    EKG Interpretation  Date/Time:  Tuesday May 16 2021 06:34:19 EST Ventricular Rate:  117 PR Interval:  185 QRS Duration: 92 QT Interval:  336 QTC Calculation: 469 R Axis:   -31 Text Interpretation: Sinus tachycardia Atrial premature complex Left axis deviation Abnormal R-wave progression, early transition Borderline ST depression, lateral leads Confirmed by Gerlene Fee [phone]) on 04/30/2021 6:48:17 AM       Labs Reviewed  I-STAT CHEM 8, ED - Abnormal; Notable for the following components:      Result Value   BUN 45 (*)    Creatinine, Ser 3.70 (*)    Calcium, Ion 1.07 (*)    TCO2 21 (*)    Hemoglobin 11.9 (*)    HCT 35.0 (*)    All other components within normal limits  RESP PANEL BY RT-PCR (FLU A&B, COVID) ARPGX2  CULTURE, BLOOD (  ROUTINE X 2)  CULTURE, BLOOD (ROUTINE X 2)  URINE CULTURE  LACTIC ACID, PLASMA  LACTIC ACID, PLASMA  COMPREHENSIVE METABOLIC PANEL  CBC WITH DIFFERENTIAL/PLATELET  PROTIME-INR  APTT  ETHANOL  URINALYSIS, ROUTINE W REFLEX MICROSCOPIC  BLOOD GAS, ARTERIAL  SAMPLE TO BLOOD BANK  TROPONIN I (HIGH SENSITIVITY)    CT HEAD WO CONTRAST ( )  Final Result    CT CERVICAL SPINE WO CONTRAST  Final Result    DG Chest Port 1 View    (Results Pending)  DG Pelvis Portable    (Results Pending)  CT CHEST ABDOMEN PELVIS W CONTRAST    (Results Pending)    Medications  ceFEPIme (MAXIPIME) 2 g in sodium chloride 0.9 % 100 mL IVPB (2 g Intravenous New Bag/Given 2021/05/23 0711)  0.9 %  sodium chloride infusion (has no  administration in time range)  EPINEPHrine (ADRENALIN) 5 mg in NS 250 mL (0.02 mg/mL) premix infusion (has no administration in time range)  vasopressin (PITRESSIN) 20 Units in sodium chloride 0.9 % 100 mL infusion-*FOR SHOCK* (has no administration in time range)  0.9 %  sodium chloride infusion (1,000 mLs Intravenous New Bag/Given 05-23-2021 0636)  etomidate (AMIDATE) injection (20 mg Intravenous Given May 23, 2021 0629)  rocuronium (ZEMURON) injection (100 mg Intravenous Given 05-23-2021 0630)  methylPREDNISolone sodium succinate (SOLU-MEDROL) 125 mg/2 mL injection 125 mg (has no administration in time range)  iohexol (OMNIPAQUE) 350 MG/ML injection 100 mL (has no administration in time range)  norepinephrine (LEVOPHED) 4mg  in premix infusion (18 mcg/min Intravenous New Bag/Given 23-May-2021 0645)  sodium chloride 0.9 % bolus 1,000 mL (1,000 mLs Intravenous New Bag/Given 05/23/2021 0640)  fentaNYL 05/18/21 in NS (20mcg/ml) infusion-PREMIX (25 mcg/hr Intravenous New Bag/Given 05-23-21 0639)  iohexol (OMNIPAQUE) 350 MG/ML injection 100 mL (100 mLs Intravenous Contrast Given 2021/05/23 0700)     Procedures  /  Critical Care .Critical Care Performed by: 05/18/21, MD Authorized by: Sabas Sous, MD   Critical care provider statement:    Critical care time (minutes):  33   Critical care was necessary to treat or prevent imminent or life-threatening deterioration of the following conditions:  Trauma   Critical care was time spent personally by me on the following activities:  Development of treatment plan with patient or surrogate, discussions with consultants, evaluation of patient's response to treatment, examination of patient, ordering and review of laboratory studies, ordering and review of radiographic studies, ordering and performing treatments and interventions, pulse oximetry, re-evaluation of patient's condition and review of old charts Procedure Name: Intubation Date/Time:  23-May-2021 6:52 AM Performed by: 05/18/2021, MD Pre-anesthesia Checklist: Patient identified, Patient being monitored, Emergency Drugs available, Timeout performed and Suction available Oxygen Delivery Method: Non-rebreather mask Preoxygenation: Pre-oxygenation with 100% oxygen Induction Type: Rapid sequence Ventilation: Mask ventilation without difficulty Laryngoscope Size: Glidescope and 4 Grade View: Grade II Tube size: 7.5 mm Number of attempts: 1 Airway Equipment and Method: Rigid stylet Placement Confirmation: ETT inserted through vocal cords under direct vision, CO2 detector and Breath sounds checked- equal and bilateral Secured at: 25 cm Tube secured with: ETT holder Comments: Uncomplicated RSI using 20 mg etomidate, 100 mg rocuronium.     ED Course and Medical Decision Making  I have reviewed the triage vital signs, the nursing notes, and pertinent available records from the EMR.  Listed above are laboratory and imaging tests that I personally ordered, reviewed, and interpreted and then considered in my medical decision making (see below for details).  Differential diagnosis includes significant traumatic injury such as intracranial bleeding, also includes sepsis this patient presents in shock with hypoxia and rhonchi, suspect pulmonary etiology.  Requiring large dose norepinephrine.  Central line placed by trauma surgeon.  Intubated as described above.  Awaiting CT imaging.  Providing fluids, antibiotics.     Awaiting trauma scans to determine trauma versus medical ICU admission.  Signed out to oncoming provider at shift change.  Barth Kirks. Sedonia Small, [place] [email or web address]  Final Clinical Impressions(s) / ED Diagnoses     ICD-10-CM   1. Shock ([place])  R57.9     2. Fall  W19.XXXA CT CHEST ABDOMEN PELVIS W CONTRAST    CT CHEST [place]      ED Discharge Orders     None         Discharge Instructions Discussed with and Provided to Patient:   Discharge Instructions   None       Maudie Flakes, MD 05/04/2021 [phone]

## 2021-05-16 NOTE — ED Notes (Signed)
TRAUMA START 

## 2021-05-16 NOTE — Procedures (Signed)
Patient Name: Brent Yu  MRN: [id number]  Epilepsy Attending: Charlsie Quest  Referring Physician/Provider: Tomma Lightning, MD Date: May 19, 2021 Duration: 24.08 mins  Patient history: 87yo M with AMS and seizure like activity. EEG to evaluate for seizure  Level of alertness:  comatose  AEDs during EEG study: None  Technical aspects: This EEG study was done with scalp electrodes positioned according to the 10-20 International system of electrode placement. Electrical activity was acquired at a sampling rate of 500Hz  and reviewed with a high frequency filter of 70Hz  and a low frequency filter of 1Hz . EEG data were recorded continuously and digitally stored.   Description: EEG showed continuous generalized 3 to 6 Hz theta-delta slowing. Hyperventilation and photic stimulation were not performed.     ABNORMALITY - Continuous slow, generalized  IMPRESSION: This study is suggestive of severe diffuse encephalopathy, nonspecific etiology. No seizures or epileptiform discharges were seen throughout the recording.  Cameron Katayama 

## 2021-05-16 NOTE — Progress Notes (Signed)
Possible seizure activity noted  Responded to 2 mg Ativan x1  Will order EEG  CT head today did not show any abnormality following a fall  Will monitor closely

## 2021-05-16 NOTE — H&P (Signed)
NAME:  Brent Yu, MRN:  [id number], DOB:  [birth year], LOS: 0 ADMISSION DATE:  05/13/2021, CONSULTATION DATE: 04/26/2021 REFERRING MD: Violeta Gelinas, CHIEF COMPLAINT: Altered mental status respiratory failure sepsis  History of Present Illness:  87 year old male who was in his usual state of good health approximately 3 days ago his wife who is a Publishing rights manager noted jerky movements while walking and talking.  The plan was to seek medical attention today 04/18/2021 but he fell out of bed and injured his head is brought to [facility] emergency department as a trauma patient.  After evaluation by the trauma team he is noted to have elevated white count, bilateral airspace disease on CT scan and became hypotensive and more of a sepsis picture than a trauma picture.  Pulmonary critical care was asked to take over and admit.  He has an extensive medical history but most importantly has a syndrome of inappropriate ADH that is controlled with salt tablets and fluid restriction.  He is able to drive and perform all activities of daily living without problems prior to this admission  Pertinent  Medical History    HISTORY:  Past Medical History:      Past Medical History:  Diagnosis Date   Arthritis      HANDS   Benign prostatic hyperplasia     Bladder neck contracture     Complication of anesthesia 1992    after 1992 tumor removal from stomach, my blood pressure went up high in the recovery  but ive had surgeries since then with no problems   GERD (gastroesophageal reflux disease)     Gilbert's syndrome      no significant health problems related to this issue    Hereditary and idiopathic peripheral neuropathy     History of adenomatous polyp of colon      2010 tubular adenoma/  2015 hyperplastic   History of benign gastric tumor      resection leiomyoma   History of esophagitis     Hyperlipidemia     Hypertension     Hypothyroidism     Lower urinary tract symptoms (LUTS)     Mild  persistent asthma     Urinary incontinence     Wears glasses     Wears partial dentures      UPPER    Past Surgical History:       Past Surgical History:  Procedure Laterality Date   CYSTO/  BILATERAL RETROGRADE PYELOGRAM/  BLADDER BX'S WITH FULGERATION   03/23/2005   GASTRIC RESECTION   1992    benign leiomyoma tumor   INGUINAL HERNIA REPAIR Left 2000   INGUINAL HERNIA REPAIR Right 01/04/2017    Procedure: OPEN RIGHT INGUINAL HERNIA REPAIR;  Surgeon: Luretha Murphy, MD;  Location: [facility] ORS;  Service: General;  Laterality: Right;   LUMBAR LAMINECTOMY   11/09/2015   REPAIR SUBLUXING TENDON WITH GASTROC SLIDE Right 2012    ankle   TARSAL TUNNEL RELEASE Bilateral right 08-17-2011/  left 09-11-2011   TONSILLECTOMY       TRANSURETHRAL RESECTION OF PROSTATE   03/25/2003   TURP VAPORIZATION   1999    w/ Indigo laser    Social History:  reports that he quit smoking about 58 years ago. His smoking use included cigarettes. He has never used smokeless tobacco. He reports that he does not drink alcohol and does not use drugs. Family History:       Family History  Problem Relation Age of Onset  Cancer Mother     Stroke Father          HOME MEDICATIONS: Allergies as of 04/18/2021         Reactions    Lisinopril Cough    Tylenol [acetaminophen] Other (See Comments)    Patient states "instructed not to take due to elevated liver enzymes"        Significant Hospital Events: Including procedures, antibiotic start and stop dates in addition to other pertinent events   04/22/2021 intubated 04/21/2021 started on vasopressors 04/20/2021 CT of the head neck negative CT of chest with bilateral airspace disease  Interim History / Subjective:  87 year old  who has known syndrome of inappropriate ADH is well controlled with salt tablets and fluid restriction.  He was in his state of usual good health approximately 3 days ago began jerking movements he was scheduled to come to the hospital  today 04/24/2021 but fell out of bed during the night had altered mental status gurgling respirations on arrival to the emergency room required intubation and mechanical ventilatory support.  He arrived as a level 1 trauma due to hitting his head on the nightstand.  But after further evaluation with elevated white count, bilateral opacities on chest x-ray consistent with pneumonia pulmonary critical care was called to evaluate and admit  Objective   Blood pressure 123/69, pulse (!) 112, temperature (!) 97.2 F (36.2 C), temperature source Rectal, resp. rate 18, height 5\' 9"  (1.753 m), weight 71.7 kg, SpO2 92 %.    Vent Mode: PRVC FiO2 (%):  [60 %-100 %] 60 % Set Rate:  [18 bmp] 18 bmp Vt Set:  [550 mL] 550 mL PEEP:  [5 cmH20] 5 cmH20 Plateau Pressure:  [16 cmH20-18 cmH20] 18 cmH20   Intake/Output Summary (Last 24 hours) at 04/28/2021 0845 Last data filed at 04/26/2021 [id number] Gross per 24 hour  Intake 1000 ml  Output --  Net 1000 ml   Filed Weights   05/06/2021 0800  Weight: 71.7 kg    Examination: General: Thin male who is sedated and on full mechanical ventilatory support HENT: No JVD or lymphadenopathy is appreciated Lungs: Coarse rhonchi bilaterally in the bases Cardiovascular: Heart sounds are distant Abdomen: Soft nontender positive bowel sound Extremities: Without edema Neuro: Currently sedated on fentanyl does not respond to noxious stimuli but was reported to be awake and alert on admission GU: Foley catheter to be inserted  Resolved Hospital Problem list     Assessment & Plan:  Altered mental status of unknown origin with negative CT Monitor in intensive care unit Monitor electrolytes closely Note he is on sedation for endotracheal tube tolerance  Acute respiratory failure with suspected aspiration versus bilateral lobar pneumonia.  Hypoxia, history of asthma, sepsis ventilator settings adjusted with increased respiratory rate and increased PEEP for better  oxygenation and to assist with improving pH Bronchodilator Solu-Medrol Wean FiO2 as tolerated Empirical antimicrobial therapy for presumed pneumonia and skilled nursing facility environment Question whether or not to add vancomycin due to living arrangement Low-dose vasopressors May need central line in near future   Acute renal insufficiency Lab Results  Component Value Date   CREATININE 3.70 (H) 05/07/2021   CREATININE 3.70 (H) 05/13/2021   Avoid nephrotoxins Renal dose medication   Syndrome of inappropriate ADH Recent Labs  Lab 04/20/2021 0622 05/05/2021 0640 05/04/2021 0732  NA 139 140 141   Monitor electrolytes closely  Hypothyroidism Check TSH Restart Synthroid at half dose if given IV  Gilbert's syndrome Monitor liver function  Best Practice (right click and "Reselect all SmartList Selections" daily)   Diet/type: NPO DVT prophylaxis: prophylactic heparin  GI prophylaxis: PPI Lines: N/A Foley:  N/A Code Status:  full code Last date of multidisciplinary goals of care discussion [04/27/2021 wife updated at bedside]  Labs   CBC: Recent Labs  Lab 05/11/2021 0622 04/29/2021 0640 04/22/2021 0732  WBC 17.1*  --   --   NEUTROABS 13.1*  --   --   HGB 12.0* 11.9* 10.9*  HCT 37.1* 35.0* 32.0*  MCV 100.0  --   --   PLT 141*  --   --     Basic Metabolic Panel: Recent Labs  Lab 05/04/2021 0622 04/29/2021 0640 05/10/2021 0732  NA 139 140 141  K 4.2 4.1 3.6  CL 108 109  --   CO2 20*  --   --   GLUCOSE 100* 97  --   BUN 48* 45*  --   CREATININE 3.70* 3.70*  --   CALCIUM 8.5*  --   --    GFR: Estimated Creatinine Clearance: 14.3 mL/min (A) (by C-G formula based on SCr of 3.7 mg/dL (H)). Recent Labs  Lab 04/20/2021 0622  WBC 17.1*  LATICACIDVEN 3.6*    Liver Function Tests: Recent Labs  Lab 04/28/2021 0622  AST 47*  ALT 22  ALKPHOS 53  BILITOT 2.2*  PROT 6.0*  ALBUMIN 2.5*   No results for input(s): LIPASE, AMYLASE in the last 168 hours. No results for  input(s): AMMONIA in the last 168 hours.  ABG    Component Value Date/Time   PHART 7.271 (L) 05/14/2021 0732   PCO2ART 44.3 04/23/2021 0732   PO2ART 122 (H) 05/11/2021 0732   HCO3 20.6 05/12/2021 0732   TCO2 22 04/28/2021 0732   ACIDBASEDEF 6.0 (H) 04/24/2021 0732   O2SAT 98.0 04/18/2021 0732     Coagulation Profile: Recent Labs  Lab 05/04/2021 0622  INR 1.4*    Cardiac Enzymes: No results for input(s): CKTOTAL, CKMB, CKMBINDEX, TROPONINI in the last 168 hours.  HbA1C: No results found for: HGBA1C  CBG: No results for input(s): GLUCAP in the last 168 hours.  Review of Systems:   na  Past Medical History:  Syndrome of inappropriate ADH/hyponatremia/hyponatremia under the care of endocrinologist fluid restriction and salt tablets only intervention Hypothyroidism Asthma Chronic pain for which he takes Neurontin   Surgical History:    Social History:  Lives with wife who is a Designer, jewellery at assisted living home.  Family History:  His family history is not on file.   Allergies Allergies  Allergen Reactions   Acetaminophen     Can't take due to liver function.   Atorvastatin     Elevated liver function active   Hydrochlorothiazide     Hyponatremia active   Lisinopril Cough     Home Medications  Prior to Admission medications   Medication Sig Start Date End Date Taking? Authorizing Provider  ADVAIR DISKUS 250-50 MCG/ACT AEPB Inhale 1 puff into the lungs in the morning and at bedtime. 03/23/21  Yes [provider]  albuterol (VENTOLIN HFA) 108 (90 Base) MCG/ACT inhaler Inhale 1 puff into the lungs every 6 (six) hours as needed for wheezing or shortness of breath.   Yes [provider]  amLODipine (NORVASC) 5 MG tablet Take 5 mg by mouth daily. 04/03/21  Yes [provider]  fluticasone (FLONASE) 50 MCG/ACT nasal spray Place 2 sprays into both nostrils daily. 04/12/21  Yes [provider]  gabapentin (NEURONTIN) 800 MG  tablet Take 800 mg by mouth 3 (three) times daily. 02/22/21  Yes [provider]  ibuprofen (ADVIL) 200 MG tablet Take 200 mg by mouth every 6 (six) hours as needed for fever, mild pain or moderate pain.   Yes [provider]  levocetirizine (XYZAL) 5 MG tablet Take 5 mg by mouth every evening. 02/27/21  Yes [provider]  levothyroxine (SYNTHROID) 25 MCG tablet Take 25 mcg by mouth every morning. Takes @ 0300 QD-scheduled. 04/20/21  Yes [provider]  mometasone (ELOCON) 0.1 % cream Apply 1 application topically daily as needed (undiagnosed inflammatory/allergy related skin condition.). 11/22/20  Yes [provider]  montelukast (SINGULAIR) 10 MG tablet Take 10 mg by mouth every evening. 04/03/21  Yes [provider]  Multiple Vitamins-Minerals (CENTRUM SILVER 50+MEN PO) Take 1 tablet by mouth daily.   Yes [provider]  omeprazole (PRILOSEC) 20 MG capsule Take 20 mg by mouth daily.   Yes [provider]  Propylene Glycol (SYSTANE COMPLETE OP) Place 1 drop into both eyes daily.   Yes [provider]  rosuvastatin (CRESTOR) 10 MG tablet Take 10 mg by mouth daily. 05/05/21  Yes [provider]  sildenafil (REVATIO) 20 MG tablet Take 10 mg by mouth daily as needed (erectile dysfunction).   Yes [provider]  sodium chloride 1 g tablet Take 2 g by mouth 2 (two) times daily with a meal.   Yes [provider]  telmisartan (MICARDIS) 80 MG tablet Take 80 mg by mouth daily. 03/01/21  Yes [provider]     Critical care time: 60 min    Richardson Landry Micholas Drumwright ACNP Acute Care Nurse Practitioner [place] Please consult Amion 04/29/2021, 8:46 AM

## 2021-05-16 NOTE — Sepsis Progress Note (Signed)
Monitoring for the code sepsis protocol. °

## 2021-05-16 NOTE — ED Notes (Incomplete)
Patient

## 2021-05-16 NOTE — Plan of Care (Signed)
  Problem: Education: Goal: Knowledge of General Education information will improve Description: Including pain rating scale, medication(s)/side effects and non-pharmacologic comfort measures Outcome: Progressing   Problem: Health Behavior/Discharge Planning: Goal: Ability to manage health-related needs will improve Outcome: Progressing   Problem: Clinical Measurements: Goal: Will remain free from infection Outcome: Progressing Goal: Cardiovascular complication will be avoided Outcome: Progressing   Problem: Activity: Goal: Risk for activity intolerance will decrease Outcome: Progressing   Problem: Nutrition: Goal: Adequate nutrition will be maintained Outcome: Progressing   Problem: Coping: Goal: Level of anxiety will decrease Outcome: Progressing   Problem: Elimination: Goal: Will not experience complications related to bowel motility Outcome: Progressing Goal: Will not experience complications related to urinary retention Outcome: Progressing   Problem: Pain Managment: Goal: General experience of comfort will improve Outcome: Progressing   Problem: Safety: Goal: Ability to remain free from injury will improve Outcome: Progressing   Problem: Skin Integrity: Goal: Risk for impaired skin integrity will decrease Outcome: Progressing   Problem: Activity: Goal: Ability to tolerate increased activity will improve Outcome: Progressing   Problem: Respiratory: Goal: Ability to maintain a clear airway and adequate ventilation will improve Outcome: Progressing

## 2021-05-16 NOTE — ED Notes (Signed)
Pt to CT at [phone] with Dr. Bedelia Person and respiratory at bedside.   Patient back from CT 0705. Dr. Pilar Plate at bedside.

## 2021-05-16 NOTE — ED Notes (Signed)
Lab collecting blood specimens

## 2021-05-16 NOTE — Progress Notes (Signed)
Increasing pressor requirements  On Levophed, vasopressin, epi added  Obtain echocardiogram

## 2021-05-16 NOTE — Procedures (Signed)
Arterial Catheter Insertion Procedure Note  Brent Yu  [id number]  19-Apr-1933  Date:Jun 07, 2021  Time:12:44 PM    Provider Performing: Dewain Penning T    Procedure: Insertion of Arterial Line (83151) without US guidance  Indication(s) Blood pressure monitoring and/or need for frequent ABGs  Consent Unable to obtain consent due to emergent nature of procedure.  Anesthesia None   Time Out Verified patient identification, verified procedure, site/side was marked, verified correct patient position, special equipment/implants available, medications/allergies/relevant history reviewed, required imaging and test results available.   Sterile Technique Maximal sterile technique including full sterile barrier drape, hand hygiene, sterile gown, sterile gloves, mask, hair covering, sterile ultrasound probe cover (if used).   Procedure Description Area of catheter insertion was cleaned with chlorhexidine and draped in sterile fashion. Without real-time ultrasound guidance an arterial catheter was placed into the right radial artery.  Appropriate arterial tracings confirmed on monitor.     Complications/Tolerance None; patient tolerated the procedure well.   EBL Minimal   Specimen(s) None

## 2021-05-16 NOTE — H&P (Addendum)
TRAUMA H&P  2021-05-27, 6:55 AM   Chief Complaint: Level 1 trauma activation for hypotension, hypoxia  Primary Survey:  ABC's intact on arrival  The patient is an 87 y.o. male.   HPI: 68M reportedly fell out of bed and found by his wife. Wife is reportedly "sick" per EMS. Hypotensive en route, on 4 of epi on arrival. Sats 70s on RA, placed on NRB. Patient reports cough in the days preceding. Labored breathing on arrival. PIV access only. CVC placed, intubated after arrival.   No past medical history on file.  No pertinent family history.  Social History:  has no history on file for tobacco use, alcohol use, and drug use.     Allergies: Not on File  Medications: reviewed  Results for orders placed or performed during the hospital encounter of 05/27/21 (from the past 48 hour(s))  Sample to Blood Bank     Status: None   Collection Time: 2021-05-27  6:27 AM  Result Value Ref Range   Blood Bank Specimen SAMPLE AVAILABLE FOR TESTING    Sample Expiration      05/17/2021,2359 Performed at [facility], [address] [place], [place] [address]   I-Stat Chem 8, ED     Status: Abnormal   Collection Time: 05/27/2021  6:40 AM  Result Value Ref Range   Sodium 140 135 - 145 mmol/L   Potassium 4.1 3.5 - 5.1 mmol/L   Chloride 109 98 - 111 mmol/L   BUN 45 (H) 8 - 23 mg/dL   Creatinine, Ser 3.53 (H) 0.61 - 1.24 mg/dL   Glucose, Bld 97 70 - 99 mg/dL    Comment: Glucose reference range applies only to samples taken after fasting for at least 8 hours.   Calcium, Ion 1.07 (L) 1.15 - 1.40 mmol/L   TCO2 21 (L) 22 - 32 mmol/L   Hemoglobin 11.9 (L) 13.0 - 17.0 g/dL   HCT 61.4 (L) 43.1 - 54.0 %    No results found.  ROS 10 point review of systems is negative except as listed above in HPI.  Blood pressure 113/70, pulse (!) 121, resp. rate 19, SpO2 99 %.  Secondary Survey:  GCS: E(4)//V(5)//M(6) Constitutional: well-developed, well-nourished Skull: normocephalic, atraumatic Eyes:  pupils equal, round, reactive to light, 60mm b/l, moist conjunctiva Face/ENT: midface stable without deformity, poor  dentition, external inspection of ears and nose normal, hearing intact  Oropharynx: normal oropharyngeal mucosa, no blood Neck: no thyromegaly, trachea midline, no midline cervical tenderness to palpation, no C-spine stepoffs Chest: breath sounds equal bilaterally, labored  respiratory effort, no midline or lateral chest wall tenderness to palpation/deformity Abdomen: soft, NT, no bruising, no hepatosplenomegaly FAST: not performed Pelvis: stable GU: no blood at urethral meatus of penis, no scrotal masses or abnormality Back: no wounds, no T/L spine TTP, no T/L spine stepoffs Rectal: deferred Extremities: absent  radial and pedal pulses bilaterally, intact motor and sensation of bilateral UE and LE, no peripheral edema MSK: unable to assess gait/station, no clubbing/cyanosis of fingers/toes, normal ROM of all four extremities Skin: warm, dry, no rashes  CXR in TB: bibasilar PNA Pelvis XR in TB: symphysis closed  Procedures in TB: intubation (by EDP), CVC placement    Assessment/Plan: Problem List Fall out of bed  Plan Fall - no traumatic injuries PNA - rec'd cefepime in TB, recommend continuing broad spectrum coverage Septic shock - cont levophed, recommend adding vasopressin, may benefit from volume resuscitation. Recommend transitioning femoral CVC to PICC line VDRF -  would recommend remaining intubated until better control of septic source achieved FEN - NPO, recommend early initiation of TF DVT - SCDs, cleared from a trauma standpoint for chemical DVT ppx and would recommend initiation Dispo - Cleared from trauma perspective, awaiting disposition determination by CCM service, but anticipate inpatient admission to ICU  Critical care time:  Diamantina Monks, MD General and Trauma Surgery [facility]

## 2021-05-16 NOTE — Progress Notes (Signed)
Initial Nutrition Assessment  DOCUMENTATION CODES:   Non-severe (moderate) malnutrition in context of chronic illness  INTERVENTION:   Initiate tube feeding via OG tube: Vital AF 1.2 at 65 ml/h (1560 ml per day)  Provides 1872 kcal, 117 gm protein, 1265 ml free water daily  NUTRITION DIAGNOSIS:   Moderate Malnutrition related to chronic illness (asthma) as evidenced by mild muscle depletion, moderate muscle depletion, mild fat depletion, moderate fat depletion.  GOAL:   Patient will meet greater than or equal to 90% of their needs  MONITOR:   TF tolerance, Vent status, Labs  REASON FOR ASSESSMENT:   Ventilator, Consult Enteral/tube feeding initiation and management  ASSESSMENT:   87 yo male admitted S/P fall out of bed. Admitted with sepsis and found to have extensive PNA. Required intubation on admission. PMH includes Gilbert's syndrome, asthma, SIADH, esophagitis, HLD, HTN, hypothyroidism.  Discussed patient in ICU rounds and with RN today. Okay to begin TF per discussion with CCM.  Patient is currently intubated on ventilator support MV: 13.1 L/min Temp (24hrs), Avg:98.6 F (37 C), Min:97.2 F (36.2 C), Max:99.1 F (37.3 C)   Labs reviewed.  CBG: 115-104  Medications reviewed and include Protonix, Fentanyl, Levophed, vasopressin.  No recent weights available for review. No family available to provide history.  Patient meets criteria for moderate malnutrition, given mild-moderate depletion of muscle and subcutaneous fat mass. Unsure if this is related to acute illness (PNA) vs chronic illness (asthma).   NUTRITION - FOCUSED PHYSICAL EXAM:  Flowsheet Row Most Recent Value  Orbital Region Moderate depletion  Upper Arm Region Moderate depletion  Thoracic and Lumbar Region Mild depletion  Buccal Region Unable to assess  Temple Region Mild depletion  Clavicle Bone Region Mild depletion  Clavicle and Acromion Bone Region Mild depletion  Scapular Bone  Region Unable to assess  Dorsal Hand Moderate depletion  Patellar Region Mild depletion  Anterior Thigh Region Mild depletion  Posterior Calf Region Moderate depletion  Edema (RD Assessment) None  Hair Reviewed  Eyes Unable to assess  Mouth Unable to assess  Skin Reviewed  Nails Reviewed       Diet Order:   Diet Order             Diet NPO time specified  Diet effective now                   EDUCATION NEEDS:   No education needs have been identified at this time  Skin:  Skin Assessment: Reviewed RN Assessment  Last BM:  no BM documented  Height:   Ht Readings from Last 1 Encounters:  2021/06/15 5\' 9"  (1.753 m)    Weight:   Wt Readings from Last 1 Encounters:  15-Jun-2021 72 kg    BMI:  Body mass index is 23.44 kg/m.  Estimated Nutritional Needs:   Kcal:  1800-2000  Protein:  105-120 gm  Fluid:  >/= 2 L    05/18/21, RD, LDN, CNSC Please refer to Amion for contact information.

## 2021-05-16 NOTE — Progress Notes (Signed)
Patient transported from ED to 2M03 on the ventilator with no problems.

## 2021-05-16 NOTE — ED Provider Notes (Signed)
87 yo male came in as level 1 trauma after ground level fall.   Head injury with normal head ct Physical Exam  BP 135/76   Pulse (!) 117   Temp (!) 97.2 F (36.2 C) (Rectal)   Resp 18   SpO2 99%   Physical Exam  ED Course/Procedures     Procedures  MDM  Patient with hypotension no definite trauma source Not anticoagulated. Probable sepsis- ? Lung etiology Awaiting trauma dispo 8:17 AM Discussed with Dr. Janee Morn.  They have consulted critical care. Critical care is at bedside and has assumed care.     Margarita Grizzle, MD 05/15/2021 [phone]

## 2021-05-16 NOTE — ED Notes (Signed)
CCM and pt's wife at bedside

## 2021-05-16 NOTE — Progress Notes (Signed)
Orthopedic Tech Progress Note Patient Details:  ELOY FEHL 1932-11-07 [id number]  Patient ID: Sampson Goon, male   DOB: May 14, 1933, 87 y.o.   MRN: [id number] I attended trauma page. Trinna Post 26-May-2021, 6:52 AM

## 2021-05-16 NOTE — Progress Notes (Signed)
EEG complete - results pending 

## 2021-05-16 NOTE — ED Triage Notes (Signed)
Patient arrives via EMS fas a trauma after a fall. Per ems, the patient fell out of bed and hit his head on a nightstand. When EMS arrived the patient was laying in his right side. Patient oxygen saturations were in the 70's on RA and BP was 50 systolic. Patient was started on an epi Infusion en route for low bp and given a 400cc saline bolus. EMS also states that family member in patients home is currently sick.   Patient arrives on non-rebreather, alert with slow responses. Patient was also had increased work of breathing on arrival with secretions.   Dr. Pilar Plate, Dr. Bedelia Person, Respiratory, and Lab at bedside on Patients arrival.

## 2021-05-16 NOTE — Procedures (Signed)
   Procedure Note  Date: 04/29/2021  Procedure: central venous catheter placement--right, femoral vein, without ultrasound guidance  Pre-op diagnosis: hypotension, need for administration of vasopressors Post-op diagnosis: same  Surgeon: Diamantina Monks, MD  Anesthesia: local  EBL: <5cc Drains/Implants: triple lumen central venous catheter  Description of procedure: This procedure was performed emergently, therefore informed consent was not obtained, but was performed under sterile conditions. The right groin was prepped and draped in the usual sterile fashion. The right femoral vein was localized with ultrasound guidance. The right femoral vein was accessed using an introducer needle and a guidewire passed through the needle. The needle was removed and a skin nick was made. The tract was dilated and the central venous catheter advanced over the guidewire followed by removal of the guidewire. All ports drew blood easily and all were flushed with saline. The catheter was secured to the skin with suture and a sterile dressing. The patient tolerated the procedure well. There were no immediate complications.   Diamantina Monks, MD General and Trauma Surgery [facility] Surgery

## 2021-05-17 ENCOUNTER — Inpatient Hospital Stay: Payer: Self-pay

## 2021-05-17 ENCOUNTER — Telehealth: Payer: Self-pay

## 2021-05-17 ENCOUNTER — Inpatient Hospital Stay (HOSPITAL_COMMUNITY): Payer: Medicare Other

## 2021-05-17 DIAGNOSIS — R778 Other specified abnormalities of plasma proteins: Secondary | ICD-10-CM

## 2021-05-17 DIAGNOSIS — R4182 Altered mental status, unspecified: Secondary | ICD-10-CM | POA: Diagnosis not present

## 2021-05-17 DIAGNOSIS — J9621 Acute and chronic respiratory failure with hypoxia: Secondary | ICD-10-CM

## 2021-05-17 DIAGNOSIS — R569 Unspecified convulsions: Secondary | ICD-10-CM | POA: Diagnosis not present

## 2021-05-17 LAB — POCT I-STAT 7, (LYTES, BLD GAS, ICA,H+H)
Acid-base deficit: 10 mmol/L — ABNORMAL HIGH (ref 0.0–2.0)
Acid-base deficit: 10 mmol/L — ABNORMAL HIGH (ref 0.0–2.0)
Bicarbonate: 16.2 mmol/L — ABNORMAL LOW (ref 20.0–28.0)
Bicarbonate: 17 mmol/L — ABNORMAL LOW (ref 20.0–28.0)
Calcium, Ion: 1.07 mmol/L — ABNORMAL LOW (ref 1.15–1.40)
Calcium, Ion: 1.06 mmol/L — ABNORMAL LOW (ref 1.15–1.40)
HCT: 37 % — ABNORMAL LOW (ref 39.0–52.0)
HCT: 35 % — ABNORMAL LOW (ref 39.0–52.0)
Hemoglobin: 12.6 g/dL — ABNORMAL LOW (ref 13.0–17.0)
Hemoglobin: 11.9 g/dL — ABNORMAL LOW (ref 13.0–17.0)
O2 Saturation: 92 %
O2 Saturation: 92 %
Patient temperature: 97.03
Patient temperature: 36.7
Potassium: 6 mmol/L — ABNORMAL HIGH (ref 3.5–5.1)
Potassium: 5.7 mmol/L — ABNORMAL HIGH (ref 3.5–5.1)
Sodium: 138 mmol/L (ref 135–145)
Sodium: 137 mmol/L (ref 135–145)
TCO2: 17 mmol/L — ABNORMAL LOW (ref 22–32)
TCO2: 18 mmol/L — ABNORMAL LOW (ref 22–32)
pCO2 arterial: 33.6 mmHg (ref 32.0–48.0)
pCO2 arterial: 38.4 mmHg (ref 32.0–48.0)
pH, Arterial: 7.286 — ABNORMAL LOW (ref 7.350–7.450)
pH, Arterial: 7.252 — ABNORMAL LOW (ref 7.350–7.450)
pO2, Arterial: 67 mmHg — ABNORMAL LOW (ref 83.0–108.0)
pO2, Arterial: 72 mmHg — ABNORMAL LOW (ref 83.0–108.0)

## 2021-05-17 LAB — PROCALCITONIN: Procalcitonin: 110.31 ng/mL

## 2021-05-17 LAB — CBC
HCT: 38.8 % — ABNORMAL LOW (ref 39.0–52.0)
Hemoglobin: 12.8 g/dL — ABNORMAL LOW (ref 13.0–17.0)
MCH: 32.4 pg (ref 26.0–34.0)
MCHC: 33 g/dL (ref 30.0–36.0)
MCV: 98.2 fL (ref 80.0–100.0)
Platelets: 160 10*3/uL (ref 150–400)
RBC: 3.95 MIL/uL — ABNORMAL LOW (ref 4.22–5.81)
RDW: 13.8 % (ref 11.5–15.5)
WBC: 12.3 10*3/uL — ABNORMAL HIGH (ref 4.0–10.5)
nRBC: 0 % (ref 0.0–0.2)

## 2021-05-17 LAB — BASIC METABOLIC PANEL
Anion gap: 10 (ref 5–15)
BUN: 59 mg/dL — ABNORMAL HIGH (ref 8–23)
CO2: 16 mmol/L — ABNORMAL LOW (ref 22–32)
Calcium: 7.4 mg/dL — ABNORMAL LOW (ref 8.9–10.3)
Chloride: 110 mmol/L (ref 98–111)
Creatinine, Ser: 2.88 mg/dL — ABNORMAL HIGH (ref 0.61–1.24)
GFR, Estimated: 21 mL/min — ABNORMAL LOW (ref >60)
Glucose, Bld: 154 mg/dL — ABNORMAL HIGH (ref 70–99)
Potassium: 6 mmol/L — ABNORMAL HIGH (ref 3.5–5.1)
Sodium: 136 mmol/L (ref 135–145)

## 2021-05-17 LAB — ECHOCARDIOGRAM COMPLETE
Area-P 1/2: 2.69 cm2
Height: 69 in
S' Lateral: 3 cm
Weight: 2684.32 oz

## 2021-05-17 LAB — LEGIONELLA PNEUMOPHILA SEROGP 1 UR AG: L. pneumophila Serogp 1 Ur Ag: NEGATIVE

## 2021-05-17 LAB — GLUCOSE, CAPILLARY
Glucose-Capillary: 123 mg/dL — ABNORMAL HIGH (ref 70–99)
Glucose-Capillary: 157 mg/dL — ABNORMAL HIGH (ref 70–99)
Glucose-Capillary: 206 mg/dL — ABNORMAL HIGH (ref 70–99)
Glucose-Capillary: 214 mg/dL — ABNORMAL HIGH (ref 70–99)
Glucose-Capillary: 149 mg/dL — ABNORMAL HIGH (ref 70–99)
Glucose-Capillary: 192 mg/dL — ABNORMAL HIGH (ref 70–99)

## 2021-05-17 LAB — MAGNESIUM: Magnesium: 2.2 mg/dL (ref 1.7–2.4)

## 2021-05-17 LAB — URINE CULTURE: Culture: NO GROWTH

## 2021-05-17 LAB — PHOSPHORUS: Phosphorus: 3.7 mg/dL (ref 2.5–4.6)

## 2021-05-17 MED ORDER — LEVOTHYROXINE SODIUM 25 MCG PO TABS: 25.0000 ug | ORAL_TABLET | Freq: Every day | ORAL | Status: DC

## 2021-05-17 MED ORDER — CHLORHEXIDINE GLUCONATE CLOTH 2 % EX PADS
6.0000 | MEDICATED_PAD | Freq: Every day | CUTANEOUS | Status: DC
  Administered 2021-05-17: 6 via TOPICAL

## 2021-05-17 MED ORDER — STERILE WATER FOR INJECTION IV SOLN
INTRAVENOUS | Status: DC
  Filled 2021-05-17 (×4): qty 1000

## 2021-05-17 MED ORDER — CHLORHEXIDINE GLUCONATE CLOTH 2 % EX PADS
6.0000 | MEDICATED_PAD | Freq: Every day | CUTANEOUS | Status: DC
  Administered 2021-05-18 – 2021-05-27 (×10): 6 via TOPICAL

## 2021-05-17 MED ORDER — SODIUM ZIRCONIUM CYCLOSILICATE 10 G PO PACK
10.0000 g | PACK | Freq: Once | ORAL | Status: AC
  Administered 2021-05-17: 10 g
  Filled 2021-05-17: qty 1

## 2021-05-17 MED ORDER — CALCIUM GLUCONATE-NACL 1-0.675 GM/50ML-% IV SOLN
1.0000 g | Freq: Once | INTRAVENOUS | Status: AC
  Administered 2021-05-17: 1000 mg via INTRAVENOUS
  Filled 2021-05-17 (×2): qty 50

## 2021-05-17 MED ORDER — INSULIN ASPART 100 UNIT/ML IJ SOLN: 5.0000 [IU] | Freq: Once | INTRAMUSCULAR | Status: DC

## 2021-05-17 MED ORDER — SODIUM CHLORIDE 0.9 % IV SOLN
2.0000 g | INTRAVENOUS | Status: DC
  Administered 2021-05-17 – 2021-05-19 (×3): 2 g via INTRAVENOUS
  Filled 2021-05-17 (×4): qty 2

## 2021-05-17 MED ORDER — SODIUM CHLORIDE 0.9% FLUSH: 10.0000 mL | INTRAVENOUS | Status: DC | PRN

## 2021-05-17 MED ORDER — INSULIN ASPART 100 UNIT/ML IJ SOLN
5.0000 [IU] | Freq: Once | INTRAMUSCULAR | Status: AC
  Administered 2021-05-17: 5 [IU] via INTRAVENOUS

## 2021-05-17 MED ORDER — SODIUM CHLORIDE 0.9% FLUSH
10.0000 mL | Freq: Two times a day (BID) | INTRAVENOUS | Status: DC
  Administered 2021-05-17 – 2021-05-27 (×10): 10 mL

## 2021-05-17 MED ORDER — LEVOTHYROXINE SODIUM 25 MCG PO TABS
25.0000 ug | ORAL_TABLET | Freq: Every day | ORAL | Status: DC
Start: 2021-05-18 — End: 2021-05-28
  Administered 2021-05-18 – 2021-05-28 (×11): 25 ug
  Filled 2021-05-17 (×11): qty 1

## 2021-05-17 MED ORDER — MIDAZOLAM HCL 2 MG/2ML IJ SOLN
1.0000 mg | INTRAMUSCULAR | Status: DC | PRN
  Administered 2021-05-18 (×2): 1 mg via INTRAVENOUS

## 2021-05-17 MED ORDER — SODIUM CHLORIDE 1 G PO TABS
2.0000 g | ORAL_TABLET | Freq: Two times a day (BID) | ORAL | Status: DC
  Administered 2021-05-17 – 2021-05-23 (×12): 2 g
  Filled 2021-05-17 (×14): qty 2

## 2021-05-17 MED ORDER — SODIUM CHLORIDE 0.9% FLUSH
10.0000 mL | Freq: Two times a day (BID) | INTRAVENOUS | Status: DC
  Administered 2021-05-17 – 2021-05-27 (×10): 10 mL

## 2021-05-17 MED ORDER — DEXTROSE 50 % IV SOLN
1.0000 | Freq: Once | INTRAVENOUS | Status: AC
  Administered 2021-05-17: 50 mL via INTRAVENOUS
  Filled 2021-05-17: qty 50

## 2021-05-17 MED ORDER — MIDAZOLAM HCL 2 MG/2ML IJ SOLN
INTRAMUSCULAR | Status: AC
  Administered 2021-05-17: 1 mg via INTRAVENOUS
  Filled 2021-05-17: qty 2

## 2021-05-17 NOTE — Telephone Encounter (Signed)
Mrs. Roesler called: " My husband in currently in the hospital in IC. I would like to speak to Dr. Lonzo Cloud. About his condition. He has an endocrine problem and need to speak with Dr. Lonzo Cloud ASAP.   Call back phone [phone].

## 2021-05-17 NOTE — Progress Notes (Signed)
Peripherally Inserted Central Catheter Placement  The IV Nurse has discussed with the patient and/or persons authorized to consent for the patient, the purpose of this procedure and the potential benefits and risks involved with this procedure.  The benefits include less needle sticks, lab draws from the catheter, and the patient may be discharged home with the catheter. Risks include, but not limited to, infection, bleeding, blood clot (thrombus formation), and puncture of an artery; nerve damage and irregular heartbeat and possibility to perform a PICC exchange if needed/ordered by physician.  Alternatives to this procedure were also discussed.  Bard Power PICC patient education guide, fact sheet on infection prevention and patient information card has been provided to patient /or left at bedside.    PICC Placement Documentation  PICC Triple Lumen 05/17/21 PICC Right Brachial 39 cm 0 cm (Active)  Indication for Insertion or Continuance of Line Prolonged intravenous therapies 05/17/21 1723  Exposed Catheter (cm) 0 cm 05/17/21 1723  Site Assessment Clean;Dry;Intact 05/17/21 1723  Lumen #1 Status Flushed;Saline locked;Blood return noted 05/17/21 1723  Lumen #2 Status Blood return noted;Saline locked;Flushed 05/17/21 1723  Lumen #3 Status Blood return noted;Flushed;Saline locked 05/17/21 1723  Dressing Type Transparent 05/17/21 1723  Dressing Status Clean;Dry;Intact 05/17/21 1723  Antimicrobial disc in place? Yes 05/17/21 1723  Dressing Intervention New dressing;Other (Comment) 05/17/21 1723  Dressing Change Due 05/24/21 05/17/21 1723    Wife signed consent at bedside   Maximino Greenland 05/17/2021, 5:24 PM

## 2021-05-17 NOTE — Progress Notes (Signed)
Pt placed back on full vent support due to agitation and no plans for extubation today.

## 2021-05-17 NOTE — Plan of Care (Signed)
  Problem: Clinical Measurements: Goal: Ability to maintain clinical measurements within normal limits will improve Outcome: Progressing Goal: Will remain free from infection Outcome: Progressing Goal: Diagnostic test results will improve Outcome: Progressing Goal: Respiratory complications will improve Outcome: Progressing Goal: Cardiovascular complication will be avoided Outcome: Progressing   Problem: Nutrition: Goal: Adequate nutrition will be maintained Outcome: Progressing   Problem: Elimination: Goal: Will not experience complications related to bowel motility Outcome: Progressing Goal: Will not experience complications related to urinary retention Outcome: Progressing   Problem: Pain Managment: Goal: General experience of comfort will improve Outcome: Progressing   Problem: Safety: Goal: Ability to remain free from injury will improve Outcome: Progressing   Problem: Skin Integrity: Goal: Risk for impaired skin integrity will decrease Outcome: Progressing   Problem: Activity: Goal: Ability to tolerate increased activity will improve Outcome: Progressing   Problem: Respiratory: Goal: Ability to maintain a clear airway and adequate ventilation will improve Outcome: Progressing

## 2021-05-17 NOTE — Progress Notes (Signed)
NAME:  Brent Yu, MRN:  [id number], DOB:  1933/02/18, LOS: 1 ADMISSION DATE:  04/23/2021, CONSULTATION DATE:  11/29 REFERRING MD:  Janee Morn, CHIEF COMPLAINT:  AMS, respiratory failure, sepsis   History of Present Illness:  Brent Yu is a 87 y.o. M who began having jerky movements ~11/26 while walking and talking. Originally he and his wife had agreed to seek medical attention for these symptoms 11/29 however he fell out of bed and struck his head after waking early 11/29. He was brought to BIB EMS who reported an initial BP of 50/30, hypoxia in the 70's on room air, AMS, and evidence of head trauma. He received epi en route and given 400 cc NS bolus to [facility] ED as a trauma patient. He was assessed and cleared from a trauma standpoint however he was found to have leukocytosis, bilateral airspace disease on CT, and hypotension.  He was intubated and central line was placed. PCCM was consulted for further management.  Pertinent  Medical History  Gilbert's syndrome, HLD, HTN, hypothyroidism, urinary incontinence, SIADH  Significant Hospital Events: Including procedures, antibiotic start and stop dates in addition to other pertinent events   05/10/2021 Intubated 05/12/2021 Started on vasopressors 04/18/2021 CT of the head neck negative, CT of chest with bilateral airspace disease  Interim History / Subjective:  Patient was attempted on wake trial this morning was highly agitated and required resumption of full ventilatory support and sedation. We will rest him again today and not plan to attempt extubation.  Objective   Blood pressure 112/62, pulse 74, temperature 98.8 F (37.1 C), resp. rate 18, height 5\' 9"  (1.753 m), weight 76.1 kg, SpO2 97 %.    Vent Mode: PRVC FiO2 (%):  [40 %-70 %] 40 % Set Rate:  [18 bmp-22 bmp] 18 bmp Vt Set:  [550 mL] 550 mL PEEP:  [5 cmH20-10 cmH20] 8 cmH20 Pressure Support:  [5 cmH20] 5 cmH20 Plateau Pressure:  [21 cmH20-25 cmH20] 23 cmH20    Intake/Output Summary (Last 24 hours) at 05/17/2021 0952 Last data filed at 05/17/2021 0900 Gross per 24 hour  Intake 5055.42 ml  Output 1030 ml  Net 4025.42 ml   Filed Weights   04/23/2021 0800 04/28/2021 1000 05/17/21 0423  Weight: 71.7 kg 72 kg 76.1 kg    Examination: Constitutional: Elderly gentleman resting in bed, no acute distress noted. Cardio: Regular rate and rhythm. No murmurs, rubs, gallops. Pulm: Clear to auscultation bilaterally. Intubated. Abdomen: Soft, non-distended. MSK: No extremity edema noted. Skin: Warm and dry. Neuro: Sedated.  Resolved Hospital Problem list     Assessment & Plan:  Acute hypoxic respiratory failure Encephalopathy Pneumonia EEG 11/29 was remarkable for diffuse encephalopathy. Patient was attempted on wake trial but did not tolerate due to significant agitation requiring resumption of full ventilatory support. - Versed PRN severe agitation, fentanyl RASS goal 0- -2 - Azithromycin 500 mg daily, cefepime 2g daily - Levophed, vasopressin, epinephrine with MAP goal >65  Acute renal insufficiency Cr improved 3.70>2.88. - Trend BMP - Avoid nephrotoxins - Replete electrolytes as needed  - Will modify TPN for optimal Mg levels  Hyperkalemia K trending upward from normal 11/29 08:59 4.2>6.0>6.0>5.7. - Lokelma 10g, once - Calcium gluconate 1g IV, once - Novolog 5 units with 1 amp D50, once - Trend BMP  History of Gilbert's - Trend LFTs  Hypothyroidism On levothyroxine 25 mcg daily at home. TSH normal at 3.179. - Resume levothyroxine 25 mcg daily  SIADH Patient is on salt tablets at  home. Sodium has been normal during this admission. - Resume home salt tablets.  Best Practice (right click and "Reselect all SmartList Selections" daily)   Diet/type: tubefeeds DVT prophylaxis: prophylactic heparin  GI prophylaxis: PPI Lines: Central line Foley:  Yes, and it is still needed Code Status:  full code Last date of multidisciplinary  goals of care discussion [11/29]  Labs   CBC: Recent Labs  Lab 05-20-2021 0622 05/20/21 0640 05-20-2021 0859 20-May-2021 1630 05/17/21 0403 05/17/21 0413 05/17/21 0510  WBC 17.1*  --   --   --   --  12.3*  --   NEUTROABS 13.1*  --   --   --   --   --   --   HGB 12.0*   < > 13.6 12.4* 12.6* 12.8* 11.9*  HCT 37.1*   < > 40.0 37.9* 37.0* 38.8* 35.0*  MCV 100.0  --   --   --   --  98.2  --   PLT 141*  --   --   --   --  160  --    < > = values in this interval not displayed.    Basic Metabolic Panel: Recent Labs  Lab 05-20-2021 0622 May 20, 2021 0640 05/20/2021 0732 05-20-2021 0859 05-20-21 0902 05/17/21 0403 05/17/21 0413 05/17/21 0510  NA 139 140 141 140  --  138 136 137  K 4.2 4.1 3.6 4.2  --  6.0* 6.0* 5.7*  CL 108 109  --   --   --   --  110  --   CO2 20*  --   --   --   --   --  16*  --   GLUCOSE 100* 97  --   --   --   --  154*  --   BUN 48* 45*  --   --   --   --  59*  --   CREATININE 3.70* 3.70*  --   --   --   --  2.88*  --   CALCIUM 8.5*  --   --   --   --   --  7.4*  --   MG  --   --   --   --  1.4*  --  2.2  --   PHOS  --   --   --   --  1.5*  --  3.7  --    GFR: Estimated Creatinine Clearance: 18.4 mL/min (A) (by C-G formula based on SCr of 2.88 mg/dL (H)). Recent Labs  Lab 05-20-2021 0622 05/20/2021 0830 2021/05/20 0902 05-20-21 1125 05/17/21 0413  PROCALCITON  --   --  44.02  --  110.31  WBC 17.1*  --   --   --  12.3*  LATICACIDVEN 3.6* 3.1*  --  2.8*  --     Liver Function Tests: Recent Labs  Lab 2021-05-20 0622  AST 47*  ALT 22  ALKPHOS 53  BILITOT 2.2*  PROT 6.0*  ALBUMIN 2.5*   Recent Labs  Lab May 20, 2021 0911  LIPASE 37  AMYLASE 52   No results for input(s): AMMONIA in the last 168 hours.  ABG    Component Value Date/Time   PHART 7.252 (L) 05/17/2021 0510   PCO2ART 38.4 05/17/2021 0510   PO2ART 72 (L) 05/17/2021 0510   HCO3 17.0 (L) 05/17/2021 0510   TCO2 18 (L) 05/17/2021 0510   ACIDBASEDEF 10.0 (H) 05/17/2021 0510   O2SAT 92.0 05/17/2021  0510  Coagulation Profile: Recent Labs  Lab 05/12/2021 0622  INR 1.4*    Cardiac Enzymes: No results for input(s): CKTOTAL, CKMB, CKMBINDEX, TROPONINI in the last 168 hours.  HbA1C: No results found for: HGBA1C  CBG: Recent Labs  Lab 05/14/2021 1507 05/11/2021 1908 05/09/2021 2304 05/17/21 0306 05/17/21 0711  GLUCAP 154* 125* 117* 123* 157*    Review of Systems:   Review of Systems  Unable to perform ROS: Intubated    Past Medical History:  He,  has no past medical history on file.   Surgical History:     Social History:      Family History:  His family history is not on file.   Allergies Allergies  Allergen Reactions   Acetaminophen     Can't take due to liver function.   Atorvastatin     Elevated liver function active   Hydrochlorothiazide     Hyponatremia active   Lisinopril Cough     Home Medications  Prior to Admission medications   Medication Sig Start Date End Date Taking? Authorizing Provider  ADVAIR DISKUS 250-50 MCG/ACT AEPB Inhale 1 puff into the lungs in the morning and at bedtime. 03/23/21  Yes [provider]  albuterol (VENTOLIN HFA) 108 (90 Base) MCG/ACT inhaler Inhale 1 puff into the lungs every 6 (six) hours as needed for wheezing or shortness of breath.   Yes [provider]  amLODipine (NORVASC) 5 MG tablet Take 5 mg by mouth daily. 04/03/21  Yes [provider]  fluticasone (FLONASE) 50 MCG/ACT nasal spray Place 2 sprays into both nostrils daily. 04/12/21  Yes [provider]  gabapentin (NEURONTIN) 800 MG tablet Take 800 mg by mouth 3 (three) times daily. 02/22/21  Yes [provider]  ibuprofen (ADVIL) 200 MG tablet Take 200 mg by mouth every 6 (six) hours as needed for fever, mild pain or moderate pain.   Yes [provider]  levocetirizine (XYZAL) 5 MG tablet Take 5 mg by mouth every evening. 02/27/21  Yes [provider]  levothyroxine (SYNTHROID) 25 MCG tablet Take 25 mcg  by mouth every morning. Takes @ 0300 QD-scheduled. 04/20/21  Yes [provider]  mometasone (ELOCON) 0.1 % cream Apply 1 application topically daily as needed (undiagnosed inflammatory/allergy related skin condition.). 11/22/20  Yes [provider]  montelukast (SINGULAIR) 10 MG tablet Take 10 mg by mouth every evening. 04/03/21  Yes [provider]  Multiple Vitamins-Minerals (CENTRUM SILVER 50+MEN PO) Take 1 tablet by mouth daily.   Yes [provider]  omeprazole (PRILOSEC) 20 MG capsule Take 20 mg by mouth daily.   Yes [provider]  Propylene Glycol (SYSTANE COMPLETE OP) Place 1 drop into both eyes daily.   Yes [provider]  rosuvastatin (CRESTOR) 10 MG tablet Take 10 mg by mouth daily. 05/05/21  Yes [provider]  sildenafil (REVATIO) 20 MG tablet Take 10 mg by mouth daily as needed (erectile dysfunction).   Yes [provider]  sodium chloride 1 g tablet Take 2 g by mouth 2 (two) times daily with a meal.   Yes [provider]  telmisartan (MICARDIS) 80 MG tablet Take 80 mg by mouth daily. 03/01/21  Yes [provider]     Critical care time: 40 minutes

## 2021-05-17 NOTE — Progress Notes (Signed)
Pt placed on PSV 5/5 per wean protocol and is tolerating well at this time. RN aware.

## 2021-05-17 NOTE — Progress Notes (Signed)
  Echocardiogram 2D Echocardiogram has been performed.  Augustine Radar 05/17/2021, 11:24 AM

## 2021-05-17 NOTE — Telephone Encounter (Signed)
Spoke to Brent Yu on 05/17/2021 at 1215 . Brent Yu was noted with muscle spasms for 24 hrs hours, her has been admitted to ICU for observation .    I have assured her that his sodium is normal  and to continue to follow up with his inpatient team care.    All questions answered    [name], MD  [facility] Endocrinology  [facility] [address] [place], [place] [address] Phone: [phone] FAX: [phone]

## 2021-05-18 ENCOUNTER — Inpatient Hospital Stay (HOSPITAL_COMMUNITY): Payer: Medicare Other

## 2021-05-18 DIAGNOSIS — I4891 Unspecified atrial fibrillation: Secondary | ICD-10-CM

## 2021-05-18 DIAGNOSIS — R579 Shock, unspecified: Secondary | ICD-10-CM | POA: Diagnosis not present

## 2021-05-18 DIAGNOSIS — J189 Pneumonia, unspecified organism: Secondary | ICD-10-CM

## 2021-05-18 DIAGNOSIS — E440 Moderate protein-calorie malnutrition: Secondary | ICD-10-CM

## 2021-05-18 DIAGNOSIS — J9602 Acute respiratory failure with hypercapnia: Secondary | ICD-10-CM

## 2021-05-18 DIAGNOSIS — J9601 Acute respiratory failure with hypoxia: Secondary | ICD-10-CM | POA: Diagnosis not present

## 2021-05-18 LAB — GLUCOSE, CAPILLARY
Glucose-Capillary: 163 mg/dL — ABNORMAL HIGH (ref 70–99)
Glucose-Capillary: 147 mg/dL — ABNORMAL HIGH (ref 70–99)
Glucose-Capillary: 119 mg/dL — ABNORMAL HIGH (ref 70–99)
Glucose-Capillary: 144 mg/dL — ABNORMAL HIGH (ref 70–99)
Glucose-Capillary: 112 mg/dL — ABNORMAL HIGH (ref 70–99)
Glucose-Capillary: 147 mg/dL — ABNORMAL HIGH (ref 70–99)

## 2021-05-18 LAB — POCT I-STAT 7, (LYTES, BLD GAS, ICA,H+H)
Acid-base deficit: 2 mmol/L (ref 0.0–2.0)
Bicarbonate: 22.7 mmol/L (ref 20.0–28.0)
Calcium, Ion: 1.02 mmol/L — ABNORMAL LOW (ref 1.15–1.40)
HCT: 32 % — ABNORMAL LOW (ref 39.0–52.0)
Hemoglobin: 10.9 g/dL — ABNORMAL LOW (ref 13.0–17.0)
O2 Saturation: 92 %
Patient temperature: 99.3
Potassium: 3.9 mmol/L (ref 3.5–5.1)
Sodium: 141 mmol/L (ref 135–145)
TCO2: 24 mmol/L (ref 22–32)
pCO2 arterial: 39.4 mmHg (ref 32.0–48.0)
pH, Arterial: 7.371 (ref 7.350–7.450)
pO2, Arterial: 67 mmHg — ABNORMAL LOW (ref 83.0–108.0)

## 2021-05-18 LAB — CBC
HCT: 34.3 % — ABNORMAL LOW (ref 39.0–52.0)
Hemoglobin: 10.8 g/dL — ABNORMAL LOW (ref 13.0–17.0)
MCH: 31.4 pg (ref 26.0–34.0)
MCHC: 31.5 g/dL (ref 30.0–36.0)
MCV: 99.7 fL (ref 80.0–100.0)
Platelets: 126 10*3/uL — ABNORMAL LOW (ref 150–400)
RBC: 3.44 MIL/uL — ABNORMAL LOW (ref 4.22–5.81)
RDW: 14.6 % (ref 11.5–15.5)
WBC: 9 10*3/uL (ref 4.0–10.5)
nRBC: 0 % (ref 0.0–0.2)

## 2021-05-18 LAB — BASIC METABOLIC PANEL
Anion gap: 10 (ref 5–15)
BUN: 77 mg/dL — ABNORMAL HIGH (ref 8–23)
CO2: 19 mmol/L — ABNORMAL LOW (ref 22–32)
Calcium: 6.6 mg/dL — ABNORMAL LOW (ref 8.9–10.3)
Chloride: 108 mmol/L (ref 98–111)
Creatinine, Ser: 2.79 mg/dL — ABNORMAL HIGH (ref 0.61–1.24)
GFR, Estimated: 21 mL/min — ABNORMAL LOW (ref >60)
Glucose, Bld: 233 mg/dL — ABNORMAL HIGH (ref 70–99)
Potassium: 4.1 mmol/L (ref 3.5–5.1)
Sodium: 137 mmol/L (ref 135–145)

## 2021-05-18 LAB — TROPONIN I (HIGH SENSITIVITY): Troponin I (High Sensitivity): 172 ng/L (ref <18)

## 2021-05-18 LAB — PROCALCITONIN: Procalcitonin: 147.05 ng/mL

## 2021-05-18 MED ORDER — VANCOMYCIN HCL 2000 MG/400ML IV SOLN
2000.0000 mg | Freq: Once | INTRAVENOUS | Status: AC
  Administered 2021-05-18: 2000 mg via INTRAVENOUS
  Filled 2021-05-18: qty 400

## 2021-05-18 MED ORDER — CALCIUM GLUCONATE-NACL 1-0.675 GM/50ML-% IV SOLN
1.0000 g | Freq: Once | INTRAVENOUS | Status: AC
  Administered 2021-05-18: 1000 mg via INTRAVENOUS
  Filled 2021-05-18: qty 50

## 2021-05-18 MED ORDER — DEXMEDETOMIDINE HCL IN NACL 400 MCG/100ML IV SOLN
0.4000 ug/kg/h | INTRAVENOUS | Status: DC
  Administered 2021-05-18: 0.4 ug/kg/h via INTRAVENOUS
  Administered 2021-05-18 – 2021-05-21 (×7): 0.5 ug/kg/h via INTRAVENOUS
  Filled 2021-05-18 (×8): qty 100

## 2021-05-18 MED ORDER — MIDAZOLAM HCL 2 MG/2ML IJ SOLN
INTRAMUSCULAR | Status: AC
  Filled 2021-05-18: qty 2

## 2021-05-18 MED ORDER — AMIODARONE HCL IN DEXTROSE 360-4.14 MG/200ML-% IV SOLN
60.0000 mg/h | INTRAVENOUS | Status: AC
  Administered 2021-05-18 (×2): 60 mg/h via INTRAVENOUS
  Filled 2021-05-18: qty 200

## 2021-05-18 MED ORDER — AMIODARONE LOAD VIA INFUSION
150.0000 mg | Freq: Once | INTRAVENOUS | Status: AC
  Administered 2021-05-18: 150 mg via INTRAVENOUS
  Filled 2021-05-18: qty 83.34

## 2021-05-18 MED ORDER — AMIODARONE HCL IN DEXTROSE 360-4.14 MG/200ML-% IV SOLN
30.0000 mg/h | INTRAVENOUS | Status: DC
  Administered 2021-05-18 (×2): 30 mg/h via INTRAVENOUS
  Filled 2021-05-18 (×3): qty 200

## 2021-05-18 MED ORDER — INSULIN ASPART 100 UNIT/ML IJ SOLN
0.0000 [IU] | INTRAMUSCULAR | Status: DC
  Administered 2021-05-18 (×2): 1 [IU] via SUBCUTANEOUS
  Administered 2021-05-19 (×2): 2 [IU] via SUBCUTANEOUS
  Administered 2021-05-19 (×2): 1 [IU] via SUBCUTANEOUS
  Administered 2021-05-19: 2 [IU] via SUBCUTANEOUS
  Administered 2021-05-20 (×3): 1 [IU] via SUBCUTANEOUS
  Administered 2021-05-20: 3 [IU] via SUBCUTANEOUS
  Administered 2021-05-20 – 2021-05-21 (×6): 2 [IU] via SUBCUTANEOUS
  Administered 2021-05-21: 1 [IU] via SUBCUTANEOUS
  Administered 2021-05-21 (×2): 2 [IU] via SUBCUTANEOUS
  Administered 2021-05-22 (×2): 1 [IU] via SUBCUTANEOUS
  Administered 2021-05-22: 2 [IU] via SUBCUTANEOUS
  Administered 2021-05-22: 1 [IU] via SUBCUTANEOUS
  Administered 2021-05-22: 2 [IU] via SUBCUTANEOUS
  Administered 2021-05-23 (×2): 1 [IU] via SUBCUTANEOUS
  Administered 2021-05-23 (×2): 2 [IU] via SUBCUTANEOUS
  Administered 2021-05-23 – 2021-05-25 (×8): 1 [IU] via SUBCUTANEOUS
  Administered 2021-05-25: 2 [IU] via SUBCUTANEOUS
  Administered 2021-05-25 – 2021-05-28 (×9): 1 [IU] via SUBCUTANEOUS

## 2021-05-18 MED ORDER — MIDAZOLAM HCL 2 MG/2ML IJ SOLN
2.0000 mg | INTRAMUSCULAR | Status: DC | PRN
  Administered 2021-05-18 – 2021-05-21 (×14): 2 mg via INTRAVENOUS
  Administered 2021-05-24: 4 mg via INTRAVENOUS
  Administered 2021-05-26 – 2021-05-28 (×2): 2 mg via INTRAVENOUS
  Filled 2021-05-18 (×20): qty 2

## 2021-05-18 NOTE — Progress Notes (Signed)
eLink Physician-Brief Progress Note Patient Name: LEWAYNE PAULEY DOB: 25-Feb-1933 MRN: [id number]   Date of Service  05/18/2021  HPI/Events of Note  Received request for AM labs.   eICU Interventions  CBC and BMP ordered.     Intervention Category Minor Interventions: Electrolytes abnormality - evaluation and management  Larinda Buttery 05/18/2021, 12:44 AM

## 2021-05-18 NOTE — Plan of Care (Signed)
  Problem: Clinical Measurements: Goal: Ability to maintain clinical measurements within normal limits will improve Outcome: Progressing Goal: Will remain free from infection Outcome: Progressing Goal: Diagnostic test results will improve Outcome: Progressing Goal: Respiratory complications will improve Outcome: Progressing Goal: Cardiovascular complication will be avoided Outcome: Progressing   Problem: Nutrition: Goal: Adequate nutrition will be maintained Outcome: Progressing   Problem: Safety: Goal: Ability to remain free from injury will improve Outcome: Progressing   Problem: Skin Integrity: Goal: Risk for impaired skin integrity will decrease Outcome: Progressing

## 2021-05-18 NOTE — Progress Notes (Addendum)
eLink Physician-Brief Progress Note Patient Name: Brent Yu DOB: 1932/06/20 MRN: [id number]   Date of Service  05/18/2021  HPI/Events of Note  Pt woke up after suctioning, needed to be restrained despite fentanyl gtt.  He went into rapid atrial fibrillation. Pt given fentanyl bolus and gtt increased to , versed 2mg  IV bolus given.   On camera assessment, pt calm and sedated.  HR 90s-120s. Pt on levophed and vasopressin.   eICU Interventions  Continue fentanyl gtt.  Versed increased to 2mg  IV prn.  Continue to monitor HR if HR persists in the 120s.  Right now its staying in the 90s-100s.   Can switch to neosynephrine if he has persistent rapid atrial fibrillation.  Wrist restraints ordered.      Intervention Category Major Interventions: Arrhythmia - evaluation and management;Change in mental status - evaluation and management  05/18/2021, 4:46 AM  6:29 AM Notified of hypocalcemia with calcium at 6.6, corrected for low albumin is at 7.8.   Plan> Give calcium gluconate 1g IV.

## 2021-05-18 NOTE — Progress Notes (Signed)
NAME:  Brent Yu, MRN:  [id number], DOB:  1933/01/28, LOS: 2 ADMISSION DATE:  04/26/2021, CONSULTATION DATE:  11/29 REFERRING MD:  Janee Morn, CHIEF COMPLAINT:  AMS, respiratory failure, sepsis   History of Present Illness:  Brent Yu is a 87 y.o. M who began having jerky movements ~11/26 while walking and talking. Originally he and his wife had agreed to seek medical attention for these symptoms 11/29 however he fell out of bed and struck his head after waking early 11/29. He was brought to BIB EMS who reported an initial BP of 50/30, hypoxia in the 70's on room air, AMS, and evidence of head trauma. He received epi en route and given 400 cc NS bolus to [facility] ED as a trauma patient. He was assessed and cleared from a trauma standpoint however he was found to have leukocytosis, bilateral airspace disease on CT, and hypotension.  He was intubated and central line was placed. PCCM was consulted for further management.  Pertinent  Medical History  Gilbert's syndrome, HLD, HTN, hypothyroidism, urinary incontinence, SIADH  Significant Hospital Events: Including procedures, antibiotic start and stop dates in addition to other pertinent events   05/14/2021 Intubated 05/15/2021 Started on vasopressors 04/29/2021 CT of the head neck negative, CT of chest with bilateral airspace disease  Interim History / Subjective:  Agitated overnight. AFib with RVR did not resolved with increased sedation. Remains in AFib this am.  Remains on levophed.    Objective   Blood pressure 105/72, pulse (!) 118, temperature 99.1 F (37.3 C), resp. rate 19, height 5\' 9"  (1.753 m), weight 79.3 kg, SpO2 95 %.    Vent Mode: PRVC FiO2 (%):  [40 %] 40 % Set Rate:  [18 bmp-20 bmp] 20 bmp Vt Set:  [550 mL] 550 mL PEEP:  [8 cmH20] 8 cmH20 Plateau Pressure:  [16 cmH20-28 cmH20] 24 cmH20   Intake/Output Summary (Last 24 hours) at 05/18/2021 0929 Last data filed at 05/18/2021 0800 Gross per 24 hour  Intake 5308.27 ml   Output 920 ml  Net 4388.27 ml   Filed Weights   05/02/2021 1000 05/17/21 0423 05/18/21 0411  Weight: 72 kg 76.1 kg 79.3 kg    Examination: Constitutional: acutely ill appearing male, NAD in bed on vent  Cardio: s1s2 irreg irreg, tachy Pulm: resps even non labored on vent, diminished bases L>R  Abdomen: Soft, non-distended. MSK: warm and dry, No extremity edema noted. Neuro: Sedated, RASS -2   Resolved Hospital Problem list     Assessment & Plan:  Acute hypoxic respiratory failure Pneumonia PLAN -  Vent support - 8cc/kg  F/u CXR  F/u ABG Abx as below   AMS  Acute encephalopathy  EEG 11/29 neg for seizure, shows diffuse encephalopathy  PLAN -  PAD protocol while on vent  Daily WUA   CAP Tracheal aspirate with staph, ecoli, H flu Pct continues to rise  PLAN -  Continue Azithromycin 500 mg daily, cefepime 2g daily Vanc x1 dose while awaiting staph sens  AFib with RVR  PLAN -  Amiodarone gtt  If remains in Afib will need to consider heparin gtt  TSH  Ensure adequate volume resuscitation   Acute renal insufficiency Cr improved 3.70>2.88>2.79 - Trend BMP - Avoid nephrotoxins - Replete electrolytes as needed  Hyperkalemia - resolved  F/u chem   History of Gilbert's - Trend LFTs  Hypothyroidism On levothyroxine 25 mcg daily at home. TSH normal at 3.179. - Resume levothyroxine 25 mcg daily  SIADH Patient is  on salt tablets at home. Sodium has been normal during this admission. - Resume home salt tablets.  Best Practice (right click and "Reselect all SmartList Selections" daily)   Diet/type: tubefeeds DVT prophylaxis: prophylactic heparin  GI prophylaxis: PPI Lines: Central line Foley:  Yes, and it is still needed Code Status:  full code Last date of multidisciplinary goals of care discussion [12/1]  Labs   CBC: Recent Labs  Lab 04/24/2021 0622 05/13/2021 0640 05/06/2021 1630 05/17/21 0403 05/17/21 0413 05/17/21 0510 05/18/21 0440  WBC 17.1*   --   --   --  12.3*  --  9.0  NEUTROABS 13.1*  --   --   --   --   --   --   HGB 12.0*   < > 12.4* 12.6* 12.8* 11.9* 10.8*  HCT 37.1*   < > 37.9* 37.0* 38.8* 35.0* 34.3*  MCV 100.0  --   --   --  98.2  --  99.7  PLT 141*  --   --   --  160  --  126*   < > = values in this interval not displayed.    Basic Metabolic Panel: Recent Labs  Lab 05/04/2021 0622 05/01/2021 0640 05/06/2021 0732 04/29/2021 0859 05/01/2021 0902 05/17/21 0403 05/17/21 0413 05/17/21 0510 05/18/21 0440  NA 139 140   < > 140  --  138 136 137 137  K 4.2 4.1   < > 4.2  --  6.0* 6.0* 5.7* 4.1  CL 108 109  --   --   --   --  110  --  108  CO2 20*  --   --   --   --   --  16*  --  19*  GLUCOSE 100* 97  --   --   --   --  154*  --  233*  BUN 48* 45*  --   --   --   --  59*  --  77*  CREATININE 3.70* 3.70*  --   --   --   --  2.88*  --  2.79*  CALCIUM 8.5*  --   --   --   --   --  7.4*  --  6.6*  MG  --   --   --   --  1.4*  --  2.2  --   --   PHOS  --   --   --   --  1.5*  --  3.7  --   --    < > = values in this interval not displayed.   GFR: Estimated Creatinine Clearance: 19 mL/min (A) (by C-G formula based on SCr of 2.79 mg/dL (H)). Recent Labs  Lab 05/06/2021 0622 04/28/2021 0830 05/11/2021 0902 05/01/2021 1125 05/17/21 0413 05/18/21 0440  PROCALCITON  --   --  44.02  --  110.31 147.05  WBC 17.1*  --   --   --  12.3* 9.0  LATICACIDVEN 3.6* 3.1*  --  2.8*  --   --     Liver Function Tests: Recent Labs  Lab 04/29/2021 0622  AST 47*  ALT 22  ALKPHOS 53  BILITOT 2.2*  PROT 6.0*  ALBUMIN 2.5*   Recent Labs  Lab 04/21/2021 0911  LIPASE 37  AMYLASE 52   No results for input(s): AMMONIA in the last 168 hours.  ABG    Component Value Date/Time   PHART 7.252 (L) 05/17/2021 0510   PCO2ART 38.4 05/17/2021  0510   PO2ART 72 (L) 05/17/2021 0510   HCO3 17.0 (L) 05/17/2021 0510   TCO2 18 (L) 05/17/2021 0510   ACIDBASEDEF 10.0 (H) 05/17/2021 0510   O2SAT 92.0 05/17/2021 0510     Coagulation Profile: Recent  Labs  Lab June 03, 2021 0622  INR 1.4*    Cardiac Enzymes: No results for input(s): CKTOTAL, CKMB, CKMBINDEX, TROPONINI in the last 168 hours.  HbA1C: No results found for: HGBA1C  CBG: Recent Labs  Lab 05/17/21 1510 05/17/21 1919 05/17/21 2314 05/18/21 0311 05/18/21 0753  GLUCAP 214* 149* 192* 163* 147*     Critical care time: 35 minutes    Dirk Dress, NP Pulmonary/Critical Care Medicine  05/18/2021  9:29 AM

## 2021-05-18 DEATH — deceased

## 2021-05-19 DIAGNOSIS — J9602 Acute respiratory failure with hypercapnia: Secondary | ICD-10-CM | POA: Diagnosis not present

## 2021-05-19 DIAGNOSIS — R579 Shock, unspecified: Secondary | ICD-10-CM | POA: Diagnosis not present

## 2021-05-19 DIAGNOSIS — J9601 Acute respiratory failure with hypoxia: Secondary | ICD-10-CM | POA: Diagnosis not present

## 2021-05-19 LAB — BASIC METABOLIC PANEL
Anion gap: 9 (ref 5–15)
BUN: 89 mg/dL — ABNORMAL HIGH (ref 8–23)
CO2: 19 mmol/L — ABNORMAL LOW (ref 22–32)
Calcium: 6.6 mg/dL — ABNORMAL LOW (ref 8.9–10.3)
Chloride: 108 mmol/L (ref 98–111)
Creatinine, Ser: 2.73 mg/dL — ABNORMAL HIGH (ref 0.61–1.24)
GFR, Estimated: 22 mL/min — ABNORMAL LOW (ref >60)
Glucose, Bld: 182 mg/dL — ABNORMAL HIGH (ref 70–99)
Potassium: 4 mmol/L (ref 3.5–5.1)
Sodium: 136 mmol/L (ref 135–145)

## 2021-05-19 LAB — TROPONIN I (HIGH SENSITIVITY): Troponin I (High Sensitivity): 123 ng/L (ref <18)

## 2021-05-19 LAB — VANCOMYCIN, RANDOM: Vancomycin Rm: 16

## 2021-05-19 LAB — CULTURE, RESPIRATORY W GRAM STAIN

## 2021-05-19 LAB — GLUCOSE, CAPILLARY
Glucose-Capillary: 174 mg/dL — ABNORMAL HIGH (ref 70–99)
Glucose-Capillary: 142 mg/dL — ABNORMAL HIGH (ref 70–99)
Glucose-Capillary: 157 mg/dL — ABNORMAL HIGH (ref 70–99)
Glucose-Capillary: 138 mg/dL — ABNORMAL HIGH (ref 70–99)
Glucose-Capillary: 151 mg/dL — ABNORMAL HIGH (ref 70–99)
Glucose-Capillary: 186 mg/dL — ABNORMAL HIGH (ref 70–99)

## 2021-05-19 LAB — CBC
HCT: 34.8 % — ABNORMAL LOW (ref 39.0–52.0)
Hemoglobin: 11.2 g/dL — ABNORMAL LOW (ref 13.0–17.0)
MCH: 31.5 pg (ref 26.0–34.0)
MCHC: 32.2 g/dL (ref 30.0–36.0)
MCV: 97.8 fL (ref 80.0–100.0)
Platelets: 109 10*3/uL — ABNORMAL LOW (ref 150–400)
RBC: 3.56 MIL/uL — ABNORMAL LOW (ref 4.22–5.81)
RDW: 14.7 % (ref 11.5–15.5)
WBC: 8.9 10*3/uL (ref 4.0–10.5)
nRBC: 0 % (ref 0.0–0.2)

## 2021-05-19 LAB — LACTIC ACID, PLASMA
Lactic Acid, Venous: 2.1 mmol/L (ref 0.5–1.9)
Lactic Acid, Venous: 1.3 mmol/L (ref 0.5–1.9)

## 2021-05-19 LAB — MAGNESIUM: Magnesium: 2.2 mg/dL (ref 1.7–2.4)

## 2021-05-19 MED ORDER — VANCOMYCIN HCL 1250 MG/250ML IV SOLN
1250.0000 mg | Freq: Once | INTRAVENOUS | Status: AC
  Administered 2021-05-19: 1250 mg via INTRAVENOUS
  Filled 2021-05-19: qty 250

## 2021-05-19 MED ORDER — VANCOMYCIN VARIABLE DOSE PER UNSTABLE RENAL FUNCTION (PHARMACIST DOSING): Status: DC

## 2021-05-19 NOTE — Progress Notes (Signed)
Pharmacy Antibiotic Note  Brent Yu is a 87 y.o. male admitted on Jun 13, 2021 with sepsis.  Pharmacy has been consulted for Vancomycin dosing.  Patient with worsening shock.  Got a single dose of Vanc 2000 mg x 1 on 12/1 Vanco random level on 12/2 - 16  Plan: Vanc 1250 mg IV x 1 Vanc variable dosing based on renal function Monitor renal function and the potential for RRT. Vanc levels as needed  Height: 5\' 9"  (175.3 cm) Weight: 81.3 kg (179 lb 3.7 oz) IBW/kg (Calculated) : 70.7  Temp (24hrs), Avg:99.5 F (37.5 C), Min:97.5 F (36.4 C), Max:101.1 F (38.4 C)  Recent Labs  Lab 2021/06/13 0622 06/13/21 0640 June 13, 2021 0830 06-13-21 1125 05/17/21 0413 05/18/21 0440 05/19/21 0738 05/19/21 0807 05/19/21 0830  WBC 17.1*  --   --   --  12.3* 9.0 8.9  --   --   CREATININE 3.70* 3.70*  --   --  2.88* 2.79* 2.73*  --   --   LATICACIDVEN 3.6*  --  3.1* 2.8*  --   --   --  2.1*  --   VANCORANDOM  --   --   --   --   --   --   --   --  16    Estimated Creatinine Clearance: 19.4 mL/min (A) (by C-G formula based on SCr of 2.73 mg/dL (H)).    Allergies  Allergen Reactions   Acetaminophen     Can't take due to liver function.   Atorvastatin     Elevated liver function active   Hydrochlorothiazide     Hyponatremia active   Lisinopril Cough    Antimicrobials this admission:  Azithro 11/29 >> [12/4] Cefepime 11/29 >>  Vanco 12/1 >>  Microbiology results: 11/29 Bcx NGTD 11/29 MRSA PCR Not detected 11/29 resp: Staph aureus, H influ (beta lactamase+) and e.coli 11/29 Ucx NGTD  Thank you for allowing pharmacy to be a part of this patient's care.  12/29, PharmD, [facility] Clinical Pharmacist Please see AMION for all Pharmacists' Contact Phone Numbers 05/19/2021, 12:24 PM

## 2021-05-19 NOTE — Progress Notes (Signed)
NAME:  Brent Yu, MRN:  [id number], DOB:  1932/12/09, LOS: 3 ADMISSION DATE:  05/04/2021, CONSULTATION DATE:  11/29 REFERRING MD:  Grandville Silos, CHIEF COMPLAINT:  AMS, respiratory failure, sepsis   History of Present Illness:  Brent Yu is a 87 y.o. M who began having jerky movements ~11/26 while walking and talking. Originally he and his wife had agreed to seek medical attention for these symptoms 11/29 however he fell out of bed and struck his head after waking early 11/29. He was brought to [facility] who reported an initial BP of 50/30, hypoxia in the 70's on room air, AMS, and evidence of head trauma. He received epi en route and given 400 cc NS bolus to [facility] ED as a trauma patient. He was assessed and cleared from a trauma standpoint however he was found to have leukocytosis, bilateral airspace disease on CT, and hypotension.  He was intubated and central line was placed. PCCM was consulted for further management.  Pertinent  Medical History  Gilbert's syndrome, HLD, HTN, hypothyroidism, urinary incontinence, SIADH  Significant Hospital Events: Including procedures, antibiotic start and stop dates in addition to other pertinent events   04/30/2021 Intubated 04/18/2021 Started on vasopressors 04/20/2021 CT of the head neck negative, CT of chest with bilateral airspace disease  Interim History / Subjective:  Febrile overnight with Tmax 101.1. Afebrile this am FIO2 50% PEEP 8 Levophed ~20 mcg/min Agitated overnight. AFib with RVR did not resolved with increased sedation. Remains in AFib this am.  Remains on levophed.    Objective   Blood pressure (!) 111/59, pulse (!) 57, temperature 98.2 F (36.8 C), resp. rate 20, height 5\' 9"  (1.753 m), weight 81.3 kg, SpO2 96 %.    Vent Mode: PRVC FiO2 (%):  [40 %-50 %] 50 % Set Rate:  [18 bmp-20 bmp] 20 bmp Vt Set:  [550 mL-560 mL] 550 mL PEEP:  [8 cmH20] 8 cmH20 Plateau Pressure:  [16 cmH20-26 cmH20] 26 cmH20   Intake/Output Summary  (Last 24 hours) at 05/19/2021 0732 Last data filed at 05/19/2021 0600 Gross per 24 hour  Intake 3696.72 ml  Output 965 ml  Net 2731.72 ml   Filed Weights   05/17/21 0423 05/18/21 0411 05/19/21 0458  Weight: 76.1 kg 79.3 kg 81.3 kg   Physical Exam: General: Critically ill-appearing, sedated HENT: [place], AT, ETT in place Eyes: EOMI, no scleral icterus Respiratory: Coarse breath sounds bilaterally Cardiovascular: Sinus bradycardia, -M/R/G, no JVD GI: BS+, soft, nontender Extremities:-Edema,-tenderness Neuro: Sedated GU: Foley in place   Resolved Hospital Problem list   Hyperkalemia  Assessment & Plan:  Acute hypoxic respiratory failure secondary S. Aureus/E. Coli/H.Influ pneumonia CFB +12L PLAN -  Full vent support LTVV, 4-8cc/kg IBW with goal Pplat<30 and DP<15 Continue antibiotics. De-escalate pending culture data. Await staph susceptibilities Duoneb PRN Unable to diurese for net neg due to vasopressor requirement CXR PRN ABG PRN  Septic shock secondary to pneumonia - worsening shock Echo with normal EF PLAN - Maintain MAP >65: Levophed. Restart vasopressin Continue antibiotics. Obtain random vanc  Acute encephalopathy  EEG 11/29 neg for seizure, shows diffuse encephalopathy  PLAN -  PAD protocol: Precedex Daily WUA   AFib with RVR - now in sinus brady PLAN -  Telemetry DC amio gtt If remains in Afib will need to consider heparin gtt  Ensure adequate volume resuscitation   Acute renal insufficiency Cr improved 3.70>2.88>2.79 - Trend BMP - Avoid nephrotoxins - Replete electrolytes as needed  History of Gilbert's -  Trend LFTs  Hypothyroidism On levothyroxine 25 mcg daily at home. TSH normal at 3.179. - Resume levothyroxine 25 mcg daily  SIADH Patient is on salt tablets at home. Sodium has been normal during this admission. - Resume home salt tablets.  Best Practice (right click and "Reselect all SmartList Selections" daily)   Diet/type:  tubefeeds DVT prophylaxis: prophylactic heparin  GI prophylaxis: PPI Lines: Central line Foley:  Yes, and it is still needed Code Status:  full code Last date of multidisciplinary goals of care discussion [12/1]  Labs   CBC: Recent Labs  Lab 05/07/2021 0622 05/09/2021 0640 05/17/21 0403 05/17/21 0413 05/17/21 0510 05/18/21 0440 05/18/21 1018  WBC 17.1*  --   --  12.3*  --  9.0  --   NEUTROABS 13.1*  --   --   --   --   --   --   HGB 12.0*   < > 12.6* 12.8* 11.9* 10.8* 10.9*  HCT 37.1*   < > 37.0* 38.8* 35.0* 34.3* 32.0*  MCV 100.0  --   --  98.2  --  99.7  --   PLT 141*  --   --  160  --  126*  --    < > = values in this interval not displayed.    Basic Metabolic Panel: Recent Labs  Lab 05/09/2021 0622 04/30/2021 0640 05/07/2021 0732 05/05/2021 0902 05/17/21 0403 05/17/21 0413 05/17/21 0510 05/18/21 0440 05/18/21 1018 05/19/21 0357  NA 139 140   < >  --  138 136 137 137 141  --   K 4.2 4.1   < >  --  6.0* 6.0* 5.7* 4.1 3.9  --   CL 108 109  --   --   --  110  --  108  --   --   CO2 20*  --   --   --   --  16*  --  19*  --   --   GLUCOSE 100* 97  --   --   --  154*  --  233*  --   --   BUN 48* 45*  --   --   --  59*  --  77*  --   --   CREATININE 3.70* 3.70*  --   --   --  2.88*  --  2.79*  --   --   CALCIUM 8.5*  --   --   --   --  7.4*  --  6.6*  --   --   MG  --   --   --  1.4*  --  2.2  --   --   --  2.2  PHOS  --   --   --  1.5*  --  3.7  --   --   --   --    < > = values in this interval not displayed.   GFR: Estimated Creatinine Clearance: 19 mL/min (A) (by C-G formula based on SCr of 2.79 mg/dL (H)). Recent Labs  Lab 05/12/2021 0622 04/26/2021 0830 04/30/2021 0902 04/28/2021 1125 05/17/21 0413 05/18/21 0440  PROCALCITON  --   --  44.02  --  110.31 147.05  WBC 17.1*  --   --   --  12.3* 9.0  LATICACIDVEN 3.6* 3.1*  --  2.8*  --   --     Liver Function Tests: Recent Labs  Lab 05/14/2021 0622  AST 47*  ALT 22  ALKPHOS 53  BILITOT 2.2*  PROT 6.0*  ALBUMIN  2.5*   Recent Labs  Lab 04/28/2021 0911  LIPASE 37  AMYLASE 52   No results for input(s): AMMONIA in the last 168 hours.  ABG    Component Value Date/Time   PHART 7.371 05/18/2021 1018   PCO2ART 39.4 05/18/2021 1018   PO2ART 67 (L) 05/18/2021 1018   HCO3 22.7 05/18/2021 1018   TCO2 24 05/18/2021 1018   ACIDBASEDEF 2.0 05/18/2021 1018   O2SAT 92.0 05/18/2021 1018     Coagulation Profile: Recent Labs  Lab 04/28/2021 0622  INR 1.4*    Cardiac Enzymes: No results for input(s): CKTOTAL, CKMB, CKMBINDEX, TROPONINI in the last 168 hours.  HbA1C: No results found for: HGBA1C  CBG: Recent Labs  Lab 05/18/21 1506 05/18/21 1918 05/18/21 2313 05/19/21 0321 05/19/21 0715  GLUCAP 144* 112* 147* 174* 142*     Critical care time:     The patient is critically ill with multiple organ systems failure and requires high complexity decision making for assessment and support, frequent evaluation and titration of therapies, application of advanced monitoring technologies and extensive interpretation of multiple databases.  Independent Critical Care Time: 50 Minutes.   Mechele Collin, M.D. [facility] Pulmonary/Critical Care Medicine 05/19/2021 7:32 AM   Please see Amion for pager number to reach on-call Pulmonary and Critical Care Team.

## 2021-05-20 ENCOUNTER — Inpatient Hospital Stay (HOSPITAL_COMMUNITY): Payer: Medicare Other

## 2021-05-20 DIAGNOSIS — J9602 Acute respiratory failure with hypercapnia: Secondary | ICD-10-CM | POA: Diagnosis not present

## 2021-05-20 DIAGNOSIS — J9601 Acute respiratory failure with hypoxia: Secondary | ICD-10-CM | POA: Diagnosis not present

## 2021-05-20 DIAGNOSIS — R579 Shock, unspecified: Secondary | ICD-10-CM | POA: Diagnosis not present

## 2021-05-20 LAB — POCT I-STAT 7, (LYTES, BLD GAS, ICA,H+H)
Acid-base deficit: 4 mmol/L — ABNORMAL HIGH (ref 0.0–2.0)
Bicarbonate: 20.7 mmol/L (ref 20.0–28.0)
Calcium, Ion: 1.17 mmol/L (ref 1.15–1.40)
HCT: 32 % — ABNORMAL LOW (ref 39.0–52.0)
Hemoglobin: 10.9 g/dL — ABNORMAL LOW (ref 13.0–17.0)
O2 Saturation: 94 %
Patient temperature: 97.9
Potassium: 4 mmol/L (ref 3.5–5.1)
Sodium: 145 mmol/L (ref 135–145)
TCO2: 22 mmol/L (ref 22–32)
pCO2 arterial: 36.8 mmHg (ref 32.0–48.0)
pH, Arterial: 7.356 (ref 7.350–7.450)
pO2, Arterial: 71 mmHg — ABNORMAL LOW (ref 83.0–108.0)

## 2021-05-20 LAB — BASIC METABOLIC PANEL
Anion gap: 8 (ref 5–15)
BUN: 78 mg/dL — ABNORMAL HIGH (ref 8–23)
CO2: 19 mmol/L — ABNORMAL LOW (ref 22–32)
Calcium: 6.4 mg/dL — CL (ref 8.9–10.3)
Chloride: 114 mmol/L — ABNORMAL HIGH (ref 98–111)
Creatinine, Ser: 2.35 mg/dL — ABNORMAL HIGH (ref 0.61–1.24)
GFR, Estimated: 26 mL/min — ABNORMAL LOW (ref >60)
Glucose, Bld: 225 mg/dL — ABNORMAL HIGH (ref 70–99)
Potassium: 3.8 mmol/L (ref 3.5–5.1)
Sodium: 141 mmol/L (ref 135–145)

## 2021-05-20 LAB — GLUCOSE, CAPILLARY
Glucose-Capillary: 172 mg/dL — ABNORMAL HIGH (ref 70–99)
Glucose-Capillary: 161 mg/dL — ABNORMAL HIGH (ref 70–99)
Glucose-Capillary: 143 mg/dL — ABNORMAL HIGH (ref 70–99)
Glucose-Capillary: 137 mg/dL — ABNORMAL HIGH (ref 70–99)
Glucose-Capillary: 131 mg/dL — ABNORMAL HIGH (ref 70–99)
Glucose-Capillary: 123 mg/dL — ABNORMAL HIGH (ref 70–99)

## 2021-05-20 LAB — CBC
HCT: 30.5 % — ABNORMAL LOW (ref 39.0–52.0)
Hemoglobin: 10.3 g/dL — ABNORMAL LOW (ref 13.0–17.0)
MCH: 32.7 pg (ref 26.0–34.0)
MCHC: 33.8 g/dL (ref 30.0–36.0)
MCV: 96.8 fL (ref 80.0–100.0)
Platelets: 88 10*3/uL — ABNORMAL LOW (ref 150–400)
RBC: 3.15 MIL/uL — ABNORMAL LOW (ref 4.22–5.81)
RDW: 15.2 % (ref 11.5–15.5)
WBC: 10.2 10*3/uL (ref 4.0–10.5)
nRBC: 0 % (ref 0.0–0.2)

## 2021-05-20 MED ORDER — SODIUM CHLORIDE 0.9 % IV SOLN
2.0000 g | INTRAVENOUS | Status: AC
  Administered 2021-05-20 – 2021-05-22 (×3): 2 g via INTRAVENOUS
  Filled 2021-05-20 (×2): qty 2

## 2021-05-20 MED ORDER — CALCIUM GLUCONATE-NACL 2-0.675 GM/100ML-% IV SOLN
2.0000 g | Freq: Once | INTRAVENOUS | Status: AC
  Administered 2021-05-20: 2000 mg via INTRAVENOUS
  Filled 2021-05-20: qty 100

## 2021-05-20 MED ORDER — AMIODARONE HCL 200 MG PO TABS
200.0000 mg | ORAL_TABLET | Freq: Every day | ORAL | Status: DC
Start: 2021-05-27 — End: 2021-05-28
  Administered 2021-05-27: 200 mg
  Filled 2021-05-20: qty 1

## 2021-05-20 MED ORDER — SODIUM CHLORIDE 0.9 % IV SOLN: 2.0000 g | INTRAVENOUS | Status: DC

## 2021-05-20 MED ORDER — AMIODARONE HCL 200 MG PO TABS
400.0000 mg | ORAL_TABLET | Freq: Two times a day (BID) | ORAL | Status: AC
  Administered 2021-05-20 – 2021-05-26 (×14): 400 mg
  Filled 2021-05-20 (×14): qty 2

## 2021-05-20 MED ORDER — FENTANYL CITRATE (PF) 2500 MCG/50ML IJ SOLN
25.0000 ug/h | Status: DC
  Administered 2021-05-20: 200 ug/h via INTRAVENOUS
  Administered 2021-05-20: 225 ug/h via INTRAVENOUS
  Administered 2021-05-21 – 2021-05-23 (×3): 200 ug/h via INTRAVENOUS
  Administered 2021-05-24: 250 ug/h via INTRAVENOUS
  Administered 2021-05-25: 175 ug/h via INTRAVENOUS
  Administered 2021-05-26: 150 ug/h via INTRAVENOUS
  Administered 2021-05-27: 225 ug/h via INTRAVENOUS
  Administered 2021-05-28: 300 ug/h via INTRAVENOUS
  Filled 2021-05-20 (×4): qty 100
  Filled 2021-05-20: qty 50
  Filled 2021-05-20 (×6): qty 100

## 2021-05-20 MED ORDER — AMIODARONE HCL 200 MG PO TABS
200.0000 mg | ORAL_TABLET | Freq: Every day | ORAL | Status: DC
Start: 2021-05-20 — End: 2021-05-20

## 2021-05-20 NOTE — Progress Notes (Signed)
NAME:  Brent Yu, MRN:  [id number], DOB:  1932/10/09, LOS: 4 ADMISSION DATE:  05/08/2021, CONSULTATION DATE:  11/29 REFERRING MD:  Grandville Silos, CHIEF COMPLAINT:  AMS, respiratory failure, sepsis   History of Present Illness:  Brent Yu is a 87 y.o. M who began having jerky movements ~11/26 while walking and talking. Originally he and his wife had agreed to seek medical attention for these symptoms 11/29 however he fell out of bed and struck his head after waking early 11/29. He was brought to [facility] EMS who reported an initial BP of 50/30, hypoxia in the 70's on room air, AMS, and evidence of head trauma. He received epi en route and given 400 cc NS bolus to [facility] ED as a trauma patient. He was assessed and cleared from a trauma standpoint however he was found to have leukocytosis, bilateral airspace disease on CT, and hypotension.  He was intubated and central line was placed. PCCM was consulted for further management.  Pertinent  Medical History  Gilbert's syndrome, HLD, HTN, hypothyroidism, urinary incontinence, SIADH  Significant Hospital Events: Including procedures, antibiotic start and stop dates in addition to other pertinent events   05/10/2021 Intubated 05/01/2021 Started on vasopressors 05/11/2021 CT of the head neck negative, CT of chest with bilateral airspace disease   Interim History / Subjective:  On improved levophed requirement  Objective   Blood pressure 113/61, pulse (!) 59, temperature 98.8 F (37.1 C), resp. rate (!) 25, height 5\' 9"  (1.753 m), weight 81.3 kg, SpO2 98 %.    Vent Mode: PRVC FiO2 (%):  [40 %-60 %] 40 % Set Rate:  [20 bmp] 20 bmp Vt Set:  [550 mL] 550 mL PEEP:  [8 cmH20] 8 cmH20 Plateau Pressure:  [18 cmH20-26 cmH20] 18 cmH20   Intake/Output Summary (Last 24 hours) at 05/20/2021 0804 Last data filed at 05/20/2021 0600 Gross per 24 hour  Intake 3411.63 ml  Output 1715 ml  Net 1696.63 ml   Filed Weights   05/18/21 0411 05/19/21 0458 05/20/21  0500  Weight: 79.3 kg 81.3 kg 81.3 kg   Physical Exam: General: Critically ill-appearing, sedated HENT: [place], AT, ETT in place Eyes: EOMI, no scleral icterus Respiratory: Coarse breath sounds bilaterally Cardiovascular: Sinus bradycardia, -M/R/G, no JVD GI: BS+, soft, nontender Extremities:-Edema,-tenderness Neuro: Sedated GU: Foley in place   Resolved Hospital Problem list   Hyperkalemia  Assessment & Plan:  Acute hypoxic respiratory failure secondary MSSA/E. Coli/H.Influ pneumonia Increased CFB +14L PLAN -  Full vent support LTVV, 4-8cc/kg IBW with goal Pplat<30 and DP<15 De-escalate to Cefepime (12/5) and azithro (12/3) Duoneb PRN Unable to diurese for net neg due to vasopressor requirement CXR PRN ABG PRN  Septic shock secondary to pneumonia - improving shock. LA cleared Echo with normal EF PLAN - Maintain MAP >65: Levophed and vasopressin Continue antibiotics for 7 day course  Acute encephalopathy  EEG 11/29 neg for seizure, shows diffuse encephalopathy  PLAN -  PAD protocol: Precedex Daily WUA   AFib with RVR - now in sinus brady PLAN -  Telemetry Off amio 12/2 If remains in Afib will need to consider heparin gtt  Ensure adequate volume resuscitation   Acute renal insufficiency - improving. Improved UOP Cr improved 3.70>2.88>2.79>2.35 - Trend BMP - Avoid nephrotoxins - Replete electrolytes as needed  History of Gilbert's - Trend LFTs  Hypothyroidism On levothyroxine 25 mcg daily at home. TSH normal at 3.179. - Resume levothyroxine 25 mcg daily  SIADH Patient is on salt tablets at  home. Sodium has been normal during this admission. - Resume home salt tablets.  Best Practice (right click and "Reselect all SmartList Selections" daily)   Diet/type: tubefeeds DVT prophylaxis: prophylactic heparin  GI prophylaxis: PPI Lines: Central line Foley:  Yes, and it is still needed Code Status:  full code Last date of multidisciplinary goals of care  discussion [12/1] Updated wife on 12/2  Labs   CBC: Recent Labs  Lab 05/15/2021 0622 05/15/2021 0640 05/17/21 0413 05/17/21 0510 05/18/21 0440 05/18/21 1018 05/19/21 0738 05/20/21 0455  WBC 17.1*  --  12.3*  --  9.0  --  8.9 10.2  NEUTROABS 13.1*  --   --   --   --   --   --   --   HGB 12.0*   < > 12.8* 11.9* 10.8* 10.9* 11.2* 10.3*  HCT 37.1*   < > 38.8* 35.0* 34.3* 32.0* 34.8* 30.5*  MCV 100.0  --  98.2  --  99.7  --  97.8 96.8  PLT 141*  --  160  --  126*  --  109* 88*   < > = values in this interval not displayed.    Basic Metabolic Panel: Recent Labs  Lab 05/12/2021 0622 04/24/2021 0640 05/11/2021 0732 04/27/2021 0902 05/17/21 0403 05/17/21 0413 05/17/21 0510 05/18/21 0440 05/18/21 1018 05/19/21 0357 05/19/21 0738 05/20/21 0455  NA 139 140   < >  --    < > 136 137 137 141  --  136 141  K 4.2 4.1   < >  --    < > 6.0* 5.7* 4.1 3.9  --  4.0 3.8  CL 108 109  --   --   --  110  --  108  --   --  108 114*  CO2 20*  --   --   --   --  16*  --  19*  --   --  19* 19*  GLUCOSE 100* 97  --   --   --  154*  --  233*  --   --  182* 225*  BUN 48* 45*  --   --   --  59*  --  77*  --   --  89* 78*  CREATININE 3.70* 3.70*  --   --   --  2.88*  --  2.79*  --   --  2.73* 2.35*  CALCIUM 8.5*  --   --   --   --  7.4*  --  6.6*  --   --  6.6* 6.4*  MG  --   --   --  1.4*  --  2.2  --   --   --  2.2  --   --   PHOS  --   --   --  1.5*  --  3.7  --   --   --   --   --   --    < > = values in this interval not displayed.   GFR: Estimated Creatinine Clearance: 22.6 mL/min (A) (by C-G formula based on SCr of 2.35 mg/dL (H)). Recent Labs  Lab 05/17/2021 0830 05/12/2021 0902 05/14/2021 1125 05/17/21 0413 05/18/21 0440 05/19/21 0738 05/19/21 0807 05/19/21 1107 05/20/21 0455  PROCALCITON  --  44.02  --  110.31 147.05  --   --   --   --   WBC  --   --   --  12.3* 9.0 8.9  --   --  10.2  LATICACIDVEN 3.1*  --  2.8*  --   --   --  2.1* 1.3  --     Liver Function Tests: Recent Labs  Lab  04/23/2021 0622  AST 47*  ALT 22  ALKPHOS 53  BILITOT 2.2*  PROT 6.0*  ALBUMIN 2.5*   Recent Labs  Lab 05/08/2021 0911  LIPASE 37  AMYLASE 52   No results for input(s): AMMONIA in the last 168 hours.  ABG    Component Value Date/Time   PHART 7.371 05/18/2021 1018   PCO2ART 39.4 05/18/2021 1018   PO2ART 67 (L) 05/18/2021 1018   HCO3 22.7 05/18/2021 1018   TCO2 24 05/18/2021 1018   ACIDBASEDEF 2.0 05/18/2021 1018   O2SAT 92.0 05/18/2021 1018     Coagulation Profile: Recent Labs  Lab 04/29/2021 0622  INR 1.4*    Cardiac Enzymes: No results for input(s): CKTOTAL, CKMB, CKMBINDEX, TROPONINI in the last 168 hours.  HbA1C: No results found for: HGBA1C  CBG: Recent Labs  Lab 05/19/21 1512 05/19/21 1914 05/19/21 2317 05/20/21 0312 05/20/21 0719  GLUCAP 138* 151* 186* 172* 161*     Critical care time:     The patient is critically ill with multiple organ systems failure and requires high complexity decision making for assessment and support, frequent evaluation and titration of therapies, application of advanced monitoring technologies and extensive interpretation of multiple databases.  Independent Critical Care Time: 38 Minutes.   Mechele Collin, M.D. [facility] Pulmonary/Critical Care Medicine 05/20/2021 8:04 AM   Please see Amion for pager number to reach on-call Pulmonary and Critical Care Team.

## 2021-05-20 NOTE — Progress Notes (Signed)
eLink Physician-Brief Progress Note Patient Name: Brent Yu DOB: 1933-05-24 MRN: [id number]   Date of Service  05/20/2021  HPI/Events of Note  Serum sodium this AM is 6.4 gm / dl, unfortunately there is not a recent serum albumin to allow calculation of corrected calcium, however ionized calcium yesterday was moderately low at 1.02.  eICU Interventions  Calcium gluconate 2 gm iv x 1 ordered.        Thomasene Lot Derin Granquist 05/20/2021, 6:13 AM

## 2021-05-21 DIAGNOSIS — I480 Paroxysmal atrial fibrillation: Secondary | ICD-10-CM

## 2021-05-21 DIAGNOSIS — J9601 Acute respiratory failure with hypoxia: Secondary | ICD-10-CM | POA: Diagnosis not present

## 2021-05-21 DIAGNOSIS — J9602 Acute respiratory failure with hypercapnia: Secondary | ICD-10-CM | POA: Diagnosis not present

## 2021-05-21 DIAGNOSIS — R579 Shock, unspecified: Secondary | ICD-10-CM | POA: Diagnosis not present

## 2021-05-21 LAB — COMPREHENSIVE METABOLIC PANEL
ALT: 126 U/L — ABNORMAL HIGH (ref 0–44)
AST: 55 U/L — ABNORMAL HIGH (ref 15–41)
Albumin: 1.6 g/dL — ABNORMAL LOW (ref 3.5–5.0)
Alkaline Phosphatase: 128 U/L — ABNORMAL HIGH (ref 38–126)
Anion gap: 9 (ref 5–15)
BUN: 76 mg/dL — ABNORMAL HIGH (ref 8–23)
CO2: 20 mmol/L — ABNORMAL LOW (ref 22–32)
Calcium: 7.3 mg/dL — ABNORMAL LOW (ref 8.9–10.3)
Chloride: 112 mmol/L — ABNORMAL HIGH (ref 98–111)
Creatinine, Ser: 2.39 mg/dL — ABNORMAL HIGH (ref 0.61–1.24)
GFR, Estimated: 26 mL/min — ABNORMAL LOW (ref >60)
Glucose, Bld: 214 mg/dL — ABNORMAL HIGH (ref 70–99)
Potassium: 4 mmol/L (ref 3.5–5.1)
Sodium: 141 mmol/L (ref 135–145)
Total Bilirubin: 1.1 mg/dL (ref 0.3–1.2)
Total Protein: 5.3 g/dL — ABNORMAL LOW (ref 6.5–8.1)

## 2021-05-21 LAB — CULTURE, BLOOD (ROUTINE X 2)
Culture: NO GROWTH
Culture: NO GROWTH
Special Requests: ADEQUATE
Special Requests: ADEQUATE

## 2021-05-21 LAB — GLUCOSE, CAPILLARY
Glucose-Capillary: 163 mg/dL — ABNORMAL HIGH (ref 70–99)
Glucose-Capillary: 142 mg/dL — ABNORMAL HIGH (ref 70–99)
Glucose-Capillary: 176 mg/dL — ABNORMAL HIGH (ref 70–99)
Glucose-Capillary: 168 mg/dL — ABNORMAL HIGH (ref 70–99)
Glucose-Capillary: 151 mg/dL — ABNORMAL HIGH (ref 70–99)
Glucose-Capillary: 152 mg/dL — ABNORMAL HIGH (ref 70–99)

## 2021-05-21 LAB — CBC
HCT: 30.4 % — ABNORMAL LOW (ref 39.0–52.0)
Hemoglobin: 10.1 g/dL — ABNORMAL LOW (ref 13.0–17.0)
MCH: 31.8 pg (ref 26.0–34.0)
MCHC: 33.2 g/dL (ref 30.0–36.0)
MCV: 95.6 fL (ref 80.0–100.0)
Platelets: 98 10*3/uL — ABNORMAL LOW (ref 150–400)
RBC: 3.18 MIL/uL — ABNORMAL LOW (ref 4.22–5.81)
RDW: 15.3 % (ref 11.5–15.5)
WBC: 12.9 10*3/uL — ABNORMAL HIGH (ref 4.0–10.5)
nRBC: 0 % (ref 0.0–0.2)

## 2021-05-21 MED ORDER — FUROSEMIDE 10 MG/ML IJ SOLN
40.0000 mg | Freq: Once | INTRAMUSCULAR | Status: AC
  Administered 2021-05-21: 40 mg via INTRAVENOUS
  Filled 2021-05-21: qty 4

## 2021-05-21 MED ORDER — PROPOFOL 1000 MG/100ML IV EMUL
5.0000 ug/kg/min | INTRAVENOUS | Status: DC
  Administered 2021-05-21: 30 ug/kg/min via INTRAVENOUS
  Administered 2021-05-21: 10 ug/kg/min via INTRAVENOUS
  Administered 2021-05-22: 40 ug/kg/min via INTRAVENOUS
  Administered 2021-05-22 (×2): 30 ug/kg/min via INTRAVENOUS
  Administered 2021-05-22: 40 ug/kg/min via INTRAVENOUS
  Administered 2021-05-23: 50 ug/kg/min via INTRAVENOUS
  Administered 2021-05-23 (×2): 40 ug/kg/min via INTRAVENOUS
  Administered 2021-05-23: 50 ug/kg/min via INTRAVENOUS
  Administered 2021-05-23 – 2021-05-24 (×5): 40 ug/kg/min via INTRAVENOUS
  Administered 2021-05-24 (×2): 50 ug/kg/min via INTRAVENOUS
  Administered 2021-05-24: 40 ug/kg/min via INTRAVENOUS
  Administered 2021-05-25: 20 ug/kg/min via INTRAVENOUS
  Administered 2021-05-25: 40 ug/kg/min via INTRAVENOUS
  Filled 2021-05-21: qty 100
  Filled 2021-05-21: qty 200
  Filled 2021-05-21: qty 100
  Filled 2021-05-21: qty 200
  Filled 2021-05-21 (×7): qty 100
  Filled 2021-05-21: qty 200
  Filled 2021-05-21 (×3): qty 100

## 2021-05-21 NOTE — Progress Notes (Signed)
eLink Physician-Brief Progress Note Patient Name: Brent Yu DOB: June 03, 1933 MRN: [id number]   Date of Service  05/21/2021  HPI/Events of Note  Vasopressin earlier order clarification needed on dosing, ordered by Dr Everardo All.   Discussed with RN. Notes, meds reviewed.   eICU Interventions  Vasopressin medicine gtt adjusted for 0.04 units , no up titration. Keep MAP > 65.      Intervention Category Minor Interventions: Routine modifications to care plan (e.g. PRN medications for pain, fever)  Ranee Gosselin 05/21/2021, 7:59 PM

## 2021-05-21 NOTE — Progress Notes (Signed)
NAME:  Brent Yu, MRN:  [id number], DOB:  September 12, 1932, LOS: 5 ADMISSION DATE:  05/09/2021, CONSULTATION DATE:  11/29 REFERRING MD:  Grandville Silos, CHIEF COMPLAINT:  AMS, respiratory failure, sepsis   History of Present Illness:  Brent Yu is a 87 y.o. M who began having jerky movements ~11/26 while walking and talking. Originally he and his wife had agreed to seek medical attention for these symptoms 11/29 however he fell out of bed and struck his head after waking early 11/29. He was brought to [facility] who reported an initial BP of 50/30, hypoxia in the 70's on room air, AMS, and evidence of head trauma. He received epi en route and given 400 cc NS bolus to [facility] ED as a trauma patient. He was assessed and cleared from a trauma standpoint however he was found to have leukocytosis, bilateral airspace disease on CT, and hypotension.  He was intubated and central line was placed. PCCM was consulted for further management.  Pertinent  Medical History  Gilbert's syndrome, HLD, HTN, hypothyroidism, urinary incontinence, SIADH  Significant Hospital Events: Including procedures, antibiotic start and stop dates in addition to other pertinent events   04/25/2021 Intubated 04/27/2021 Started on vasopressors 04/28/2021 CT of the head neck negative, CT of chest with bilateral airspace disease 05/18/2021 Added vasopressin for worsening shock 05/19/2021 Improving pressor requirement   Interim History / Subjective:  Stable on levophed 6 mcg/min and vasopressin Episode of AFRVR yesterday. Restarted on amio  Objective   Blood pressure 130/64, pulse 60, temperature 97.9 F (36.6 C), temperature source Oral, resp. rate (!) 22, height 5\' 9"  (1.753 m), weight 82.8 kg, SpO2 96 %.    Vent Mode: PRVC FiO2 (%):  [40 %-100 %] 50 % Set Rate:  [20 bmp] 20 bmp Vt Set:  [550 mL] 550 mL PEEP:  [8 cmH20] 8 cmH20 Plateau Pressure:  [19 cmH20-25 cmH20] 19 cmH20   Intake/Output Summary (Last 24 hours) at 05/21/2021  0908 Last data filed at 05/21/2021 0600 Gross per 24 hour  Intake 2661.05 ml  Output 1405 ml  Net 1256.05 ml   Filed Weights   05/19/21 0458 05/20/21 0500 05/21/21 0500  Weight: 81.3 kg 81.3 kg 82.8 kg   Physical Exam: General: Critically ill-appearing, no acute distress HENT: [place], AT, OP clear, MMM Eyes: EOMI, no scleral icterus Respiratory: Coarse breath sounds bilaterally.  No crackles, wheezing or rales Cardiovascular: RRR, -M/R/G, no JVD GI: BS+, soft, nontender Extremities:-Edema,-tenderness Neuro: Sedated, opens eyes to touch GU: Foley in place  Na 141 K 4.0 BUN/Cr 76/2.39 stable compared to 12/3 Corrected Ca 9.2  WBC 12.9 increasing  Imaging, labs and test noted above have been reviewed independently by me.  Resolved Hospital Problem list   Hyperkalemia  Assessment & Plan:  Acute hypoxic respiratory failure secondary MSSA/E. Coli/H.Influ pneumonia Full vent support LTVV, 4-8cc/kg IBW with goal Pplat<30 and DP<15 De-escalate to Cefepime (end date 12/5) and azithro (completed 12/3) Lasix 40 mg once for CFB +16L Duoneb PRN CXR PRN ABG PRN  Septic shock secondary to pneumonia  Echo with normal EF PLAN - Maintain MAP >65: Wean Levophed and vasopressin Continue antibiotics for 7 day course Cautious diuresis for vol overload  Acute encephalopathy  EEG 11/29 neg for seizure, shows diffuse encephalopathy  PLAN -  PAD protocol: Precedex and Fentanyl Daily WUA   AFib with RVR  PLAN -  Telemetry Restarted amio 12/3 If remains in Afib will need to consider heparin gtt  Ensure adequate volume resuscitation  Acute renal insufficiency - Fair UOP Cr improved 3.70>2.88>2.79>2.35 - Trend BMP - Avoid nephrotoxins - Replete electrolytes as needed  Hypocalcemia - resolved - S/p repletion yesterday - Trend  History of Gilbert's - Trend LFTs  Hypothyroidism On levothyroxine 25 mcg daily at home. TSH normal at 3.179. - Continue levothyroxine 25 mcg  daily  SIADH Patient is on salt tablets at home. Sodium has been normal during this admission. - Resume home salt tablets.  Best Practice (right click and "Reselect all SmartList Selections" daily)   Diet/type: tubefeeds DVT prophylaxis: prophylactic heparin  GI prophylaxis: PPI Lines: Central line Foley:  Yes, and it is still needed Code Status:  full code Last date of multidisciplinary goals of care discussion [12/1] Updated wife on 12/3  Labs   CBC: Recent Labs  Lab 04/30/2021 0622 05/14/2021 0640 05/17/21 0413 05/17/21 0510 05/18/21 0440 05/18/21 1018 05/19/21 0738 05/20/21 0455 05/20/21 1546 05/21/21 0244  WBC 17.1*  --  12.3*  --  9.0  --  8.9 10.2  --  12.9*  NEUTROABS 13.1*  --   --   --   --   --   --   --   --   --   HGB 12.0*   < > 12.8*   < > 10.8* 10.9* 11.2* 10.3* 10.9* 10.1*  HCT 37.1*   < > 38.8*   < > 34.3* 32.0* 34.8* 30.5* 32.0* 30.4*  MCV 100.0  --  98.2  --  99.7  --  97.8 96.8  --  95.6  PLT 141*  --  160  --  126*  --  109* 88*  --  98*   < > = values in this interval not displayed.    Basic Metabolic Panel: Recent Labs  Lab 05/10/2021 0902 05/17/21 0403 05/17/21 0413 05/17/21 0510 05/18/21 0440 05/18/21 1018 05/19/21 0357 05/19/21 0738 05/20/21 0455 05/20/21 1546 05/21/21 0244  NA  --    < > 136   < > 137 141  --  136 141 145 141  K  --    < > 6.0*   < > 4.1 3.9  --  4.0 3.8 4.0 4.0  CL  --   --  110  --  108  --   --  108 114*  --  112*  CO2  --   --  16*  --  19*  --   --  19* 19*  --  20*  GLUCOSE  --   --  154*  --  233*  --   --  182* 225*  --  214*  BUN  --   --  59*  --  77*  --   --  89* 78*  --  76*  CREATININE  --   --  2.88*  --  2.79*  --   --  2.73* 2.35*  --  2.39*  CALCIUM  --   --  7.4*  --  6.6*  --   --  6.6* 6.4*  --  7.3*  MG 1.4*  --  2.2  --   --   --  2.2  --   --   --   --   PHOS 1.5*  --  3.7  --   --   --   --   --   --   --   --    < > = values in this interval not displayed.   GFR: Estimated Creatinine  Clearance: 22.2 mL/min (A) (by C-G formula based on SCr of 2.39 mg/dL (H)). Recent Labs  Lab 05/21/2021 0830 2021/05/21 0902 05-21-21 1125 05/17/21 0413 05/18/21 0440 05/19/21 0738 05/19/21 0807 05/19/21 1107 05/20/21 0455 05/21/21 0244  PROCALCITON  --  44.02  --  110.31 147.05  --   --   --   --   --   WBC  --   --   --  12.3* 9.0 8.9  --   --  10.2 12.9*  LATICACIDVEN 3.1*  --  2.8*  --   --   --  2.1* 1.3  --   --     Liver Function Tests: Recent Labs  Lab May 21, 2021 0622 05/21/21 0244  AST 47* 55*  ALT 22 126*  ALKPHOS 53 128*  BILITOT 2.2* 1.1  PROT 6.0* 5.3*  ALBUMIN 2.5* 1.6*   Recent Labs  Lab 05/21/21 0911  LIPASE 37  AMYLASE 52   No results for input(s): AMMONIA in the last 168 hours.  ABG    Component Value Date/Time   PHART 7.356 05/20/2021 1546   PCO2ART 36.8 05/20/2021 1546   PO2ART 71 (L) 05/20/2021 1546   HCO3 20.7 05/20/2021 1546   TCO2 22 05/20/2021 1546   ACIDBASEDEF 4.0 (H) 05/20/2021 1546   O2SAT 94.0 05/20/2021 1546     Coagulation Profile: Recent Labs  Lab 05/21/21 0622  INR 1.4*    Cardiac Enzymes: No results for input(s): CKTOTAL, CKMB, CKMBINDEX, TROPONINI in the last 168 hours.  HbA1C: No results found for: HGBA1C  CBG: Recent Labs  Lab 05/20/21 1538 05/20/21 1909 05/20/21 2308 05/21/21 0307 05/21/21 0711  GLUCAP 137* 131* 123* 163* 142*     Critical care time:     The patient is critically ill with multiple organ systems failure and requires high complexity decision making for assessment and support, frequent evaluation and titration of therapies, application of advanced monitoring technologies and extensive interpretation of multiple databases.  Independent Critical Care Time: 38 Minutes.   Mechele Collin, M.D. [facility] Pulmonary/Critical Care Medicine 05/21/2021 9:08 AM   Please see Amion for pager number to reach on-call Pulmonary and Critical Care Team.

## 2021-05-22 DIAGNOSIS — J9602 Acute respiratory failure with hypercapnia: Secondary | ICD-10-CM | POA: Diagnosis not present

## 2021-05-22 DIAGNOSIS — J9601 Acute respiratory failure with hypoxia: Secondary | ICD-10-CM | POA: Diagnosis not present

## 2021-05-22 DIAGNOSIS — I480 Paroxysmal atrial fibrillation: Secondary | ICD-10-CM | POA: Diagnosis not present

## 2021-05-22 LAB — BASIC METABOLIC PANEL
Anion gap: 8 (ref 5–15)
BUN: 84 mg/dL — ABNORMAL HIGH (ref 8–23)
CO2: 24 mmol/L (ref 22–32)
Calcium: 7.6 mg/dL — ABNORMAL LOW (ref 8.9–10.3)
Chloride: 111 mmol/L (ref 98–111)
Creatinine, Ser: 2.85 mg/dL — ABNORMAL HIGH (ref 0.61–1.24)
GFR, Estimated: 21 mL/min — ABNORMAL LOW (ref >60)
Glucose, Bld: 171 mg/dL — ABNORMAL HIGH (ref 70–99)
Potassium: 4 mmol/L (ref 3.5–5.1)
Sodium: 143 mmol/L (ref 135–145)

## 2021-05-22 LAB — CBC
HCT: 27.5 % — ABNORMAL LOW (ref 39.0–52.0)
Hemoglobin: 9.1 g/dL — ABNORMAL LOW (ref 13.0–17.0)
MCH: 31.5 pg (ref 26.0–34.0)
MCHC: 33.1 g/dL (ref 30.0–36.0)
MCV: 95.2 fL (ref 80.0–100.0)
Platelets: 110 10*3/uL — ABNORMAL LOW (ref 150–400)
RBC: 2.89 MIL/uL — ABNORMAL LOW (ref 4.22–5.81)
RDW: 15.4 % (ref 11.5–15.5)
WBC: 16.2 10*3/uL — ABNORMAL HIGH (ref 4.0–10.5)
nRBC: 0 % (ref 0.0–0.2)

## 2021-05-22 LAB — GLUCOSE, CAPILLARY
Glucose-Capillary: 152 mg/dL — ABNORMAL HIGH (ref 70–99)
Glucose-Capillary: 134 mg/dL — ABNORMAL HIGH (ref 70–99)
Glucose-Capillary: 134 mg/dL — ABNORMAL HIGH (ref 70–99)
Glucose-Capillary: 139 mg/dL — ABNORMAL HIGH (ref 70–99)
Glucose-Capillary: 162 mg/dL — ABNORMAL HIGH (ref 70–99)

## 2021-05-22 LAB — TRIGLYCERIDES: Triglycerides: 181 mg/dL — ABNORMAL HIGH (ref <150)

## 2021-05-22 MED ORDER — PANTOPRAZOLE 2 MG/ML SUSPENSION
40.0000 mg | Freq: Every day | ORAL | Status: DC
Start: 2021-05-22 — End: 2021-05-28
  Administered 2021-05-22 – 2021-05-27 (×6): 40 mg
  Filled 2021-05-22 (×6): qty 20

## 2021-05-22 NOTE — Progress Notes (Signed)
eLink Physician-Brief Progress Note Patient Name: Brent Yu DOB: 1932/08/29 MRN: [id number]   Date of Service  05/22/2021  HPI/Events of Note  Renewal restraints request  Camera : On Vent.   eICU Interventions  Renewed for safety and preventing self harm and injujry     Intervention Category Intermediate Interventions: Other: (agitation)  Ranee Gosselin 05/22/2021, 6:32 AM

## 2021-05-22 NOTE — Progress Notes (Addendum)
NAME:  Brent Yu, MRN:  [id number], DOB:  1932/11/29, LOS: 6 ADMISSION DATE:  05/14/2021, CONSULTATION DATE:  11/29 REFERRING MD:  Grandville Silos, CHIEF COMPLAINT:  Respiratory failure, sepsis, and AMS   History of Present Illness:  Brent Yu is a 87 y.o. M who began having jerky movements ~11/26 while walking and talking. Originally he and his wife had agreed to seek medical attention for these symptoms 11/29 however he fell out of bed and struck his head after waking early 11/29. He was brought to [facility]IB EMS who reported an initial BP of 50/30, hypoxia in the 70's on room air, AMS, and evidence of head trauma. He received epi en route and given 400 cc NS bolus to [facility] ED as a trauma patient. He was assessed and cleared from a trauma standpoint however he was found to have leukocytosis, bilateral airspace disease on CT, and hypotension.  He was intubated and central line was placed. PCCM was consulted for further management  Pertinent  Medical History  Gilbert's syndrome, HTN, HLD, hypothyroidism, SIADH, and urinary incontinence   Significant Hospital Events: Including procedures, antibiotic start and stop dates in addition to other pertinent events   04/24/2021 Intubated 05/15/2021 Started on vasopressors 05/04/2021 CT of the head neck negative, CT of chest with bilateral airspace disease 05/18/2021 Added vasopressin for worsening shock 05/19/2021 Improving pressor requirement  12/5 Increasing levo and vaso requirements overnight and sedation  Interim History / Subjective:  Remains sedated and requiring pressors. Intermittently able to follow commands.   Objective   Blood pressure (!) 105/45, pulse 64, temperature 99.7 F (37.6 C), temperature source Axillary, resp. rate 20, height 5\' 9"  (1.753 m), weight 82 kg, SpO2 98 %.    Vent Mode: PRVC FiO2 (%):  [50 %-100 %] 70 % Set Rate:  [20 bmp] 20 bmp Vt Set:  [550 mL] 550 mL PEEP:  [8 cmH20] 8 cmH20 Plateau Pressure:  [23 cmH20] 23 cmH20    Intake/Output Summary (Last 24 hours) at 05/22/2021 0646 Last data filed at 05/22/2021 0600 Gross per 24 hour  Intake 2884.75 ml  Output 3975 ml  Net -1090.25 ml   Filed Weights   05/20/21 0500 05/21/21 0500 05/22/21 0432  Weight: 81.3 kg 82.8 kg 82 kg    Examination: General: Ill appearing male laying in bed, NAD HENT: [place], AT. MMM Lungs: Coarse breath sounds bilatearlly Cardiovascular: RRR, no murmurs appreciated Abdomen: soft, nontender, bowel sounds appreciated Extremities: Trace lower extremity edema Neuro: Sedated, opens eyes to touch GU: Foley in place  Resolved Hospital Problem list   Hyperkalemia Hypocalcemia   Assessment & Plan:  Acute hypoxic respiratory failure 2/2 to MSSA/E coli/H. Influenza pneumonia Patient remains dependent on  mechanical ventilation: PRVC with RR of 20, TV 550, PEEP 8, and FiO2 70%. - Completed course of azithromycin on 12/3 - Continue cefepime through 12/5 - Duonebs PRN - Continue to monitor CXR and ABG  Septic shock 2/2 PNA - Maintain MAP >65 - Continue levophed and vasopressin, wean as able - Complete 7 day course of abx today  Acute encephalopathy EEG 11/29 with diffuse encephalopathy, negative for seizures  Afib with RVR Continue amiodarone 400 mg daily x 7 days, transition to 200 mg on 12/10   Acute renal insufficiency ~4 L UOP in the last 24 hrs, although net +15 L this admission. Cr improved overall, but worsened in the last day. Cr 3.7>2.88>2.79>2.35>2.85 - Continue to monitor renal function - Avoid nephrotoxic agents - Hold lasix today  History of Gilbert's syndrome - Monitor LFTs  Hypothyroidism - Continue home levothyroxine 25 mcg daily  SIADH Sodium remains within normal limits, 143 today. Patient takes salt tabs at home and was continued on this during hospitalization    Best Practice (right click and "Reselect all SmartList Selections" daily)   Diet/type: tubefeeds DVT prophylaxis: prophylactic heparin   GI prophylaxis: PPI Lines: Central line Foley:  Yes, and it is still needed Code Status:  full code Last date of multidisciplinary goals of care discussion [12/3]  Labs   CBC: Recent Labs  Lab 05-31-21 0622 05-31-21 0640 05/18/21 0440 05/18/21 1018 05/19/21 0738 05/20/21 0455 05/20/21 1546 05/21/21 0244 05/22/21 0245  WBC 17.1*   < > 9.0  --  8.9 10.2  --  12.9* 16.2*  NEUTROABS 13.1*  --   --   --   --   --   --   --   --   HGB 12.0*   < > 10.8*   < > 11.2* 10.3* 10.9* 10.1* 9.1*  HCT 37.1*   < > 34.3*   < > 34.8* 30.5* 32.0* 30.4* 27.5*  MCV 100.0   < > 99.7  --  97.8 96.8  --  95.6 95.2  PLT 141*   < > 126*  --  109* 88*  --  98* 110*   < > = values in this interval not displayed.    Basic Metabolic Panel: Recent Labs  Lab 05/31/2021 0902 05/17/21 0403 05/17/21 0413 05/17/21 0510 05/18/21 0440 05/18/21 1018 05/19/21 0357 05/19/21 0738 05/20/21 0455 05/20/21 1546 05/21/21 0244 05/22/21 0245  NA  --    < > 136   < > 137   < >  --  136 141 145 141 143  K  --    < > 6.0*   < > 4.1   < >  --  4.0 3.8 4.0 4.0 4.0  CL  --   --  110  --  108  --   --  108 114*  --  112* 111  CO2  --   --  16*  --  19*  --   --  19* 19*  --  20* 24  GLUCOSE  --   --  154*  --  233*  --   --  182* 225*  --  214* 171*  BUN  --   --  59*  --  77*  --   --  89* 78*  --  76* 84*  CREATININE  --   --  2.88*  --  2.79*  --   --  2.73* 2.35*  --  2.39* 2.85*  CALCIUM  --   --  7.4*  --  6.6*  --   --  6.6* 6.4*  --  7.3* 7.6*  MG 1.4*  --  2.2  --   --   --  2.2  --   --   --   --   --   PHOS 1.5*  --  3.7  --   --   --   --   --   --   --   --   --    < > = values in this interval not displayed.   GFR: Estimated Creatinine Clearance: 18.6 mL/min (A) (by C-G formula based on SCr of 2.85 mg/dL (H)). Recent Labs  Lab 05-31-2021 0830 05/31/2021 0902 May 31, 2021 1125 05/17/21 0413 05/18/21 0440 05/19/21 1093 05/19/21 2355  05/19/21 1107 05/20/21 0455 05/21/21 0244 05/22/21 0245   PROCALCITON  --  44.02  --  110.31 147.05  --   --   --   --   --   --   WBC  --   --   --  12.3* 9.0 8.9  --   --  10.2 12.9* 16.2*  LATICACIDVEN 3.1*  --  2.8*  --   --   --  2.1* 1.3  --   --   --     Liver Function Tests: Recent Labs  Lab 04/26/2021 0622 05/21/21 0244  AST 47* 55*  ALT 22 126*  ALKPHOS 53 128*  BILITOT 2.2* 1.1  PROT 6.0* 5.3*  ALBUMIN 2.5* 1.6*   Recent Labs  Lab 04/21/2021 0911  LIPASE 37  AMYLASE 52   No results for input(s): AMMONIA in the last 168 hours.  ABG    Component Value Date/Time   PHART 7.356 05/20/2021 1546   PCO2ART 36.8 05/20/2021 1546   PO2ART 71 (L) 05/20/2021 1546   HCO3 20.7 05/20/2021 1546   TCO2 22 05/20/2021 1546   ACIDBASEDEF 4.0 (H) 05/20/2021 1546   O2SAT 94.0 05/20/2021 1546     Coagulation Profile: Recent Labs  Lab 04/30/2021 0622  INR 1.4*    Cardiac Enzymes: No results for input(s): CKTOTAL, CKMB, CKMBINDEX, TROPONINI in the last 168 hours.  HbA1C: No results found for: HGBA1C  CBG: Recent Labs  Lab 05/21/21 1112 05/21/21 1528 05/21/21 1914 05/21/21 2315 05/22/21 0308  GLUCAP 176* 168* 151* 152* 152*    Review of Systems:     Allergies Allergies  Allergen Reactions   Acetaminophen     Can't take due to liver function.   Atorvastatin     Elevated liver function active   Hydrochlorothiazide     Hyponatremia active   Lisinopril Cough    Critical care time: 35 minutes

## 2021-05-23 ENCOUNTER — Inpatient Hospital Stay (HOSPITAL_COMMUNITY): Payer: Medicare Other

## 2021-05-23 DIAGNOSIS — J9602 Acute respiratory failure with hypercapnia: Secondary | ICD-10-CM | POA: Diagnosis not present

## 2021-05-23 DIAGNOSIS — J9601 Acute respiratory failure with hypoxia: Secondary | ICD-10-CM | POA: Diagnosis not present

## 2021-05-23 LAB — GLUCOSE, CAPILLARY
Glucose-Capillary: 112 mg/dL — ABNORMAL HIGH (ref 70–99)
Glucose-Capillary: 132 mg/dL — ABNORMAL HIGH (ref 70–99)
Glucose-Capillary: 137 mg/dL — ABNORMAL HIGH (ref 70–99)
Glucose-Capillary: 147 mg/dL — ABNORMAL HIGH (ref 70–99)
Glucose-Capillary: 148 mg/dL — ABNORMAL HIGH (ref 70–99)
Glucose-Capillary: 158 mg/dL — ABNORMAL HIGH (ref 70–99)
Glucose-Capillary: 168 mg/dL — ABNORMAL HIGH (ref 70–99)

## 2021-05-23 LAB — BASIC METABOLIC PANEL
Anion gap: 12 (ref 5–15)
BUN: 90 mg/dL — ABNORMAL HIGH (ref 8–23)
CO2: 22 mmol/L (ref 22–32)
Calcium: 7.9 mg/dL — ABNORMAL LOW (ref 8.9–10.3)
Chloride: 112 mmol/L — ABNORMAL HIGH (ref 98–111)
Creatinine, Ser: 3.14 mg/dL — ABNORMAL HIGH (ref 0.61–1.24)
GFR, Estimated: 19 mL/min — ABNORMAL LOW (ref >60)
Glucose, Bld: 167 mg/dL — ABNORMAL HIGH (ref 70–99)
Potassium: 4.4 mmol/L (ref 3.5–5.1)
Sodium: 146 mmol/L — ABNORMAL HIGH (ref 135–145)

## 2021-05-23 LAB — CBC
HCT: 28.4 % — ABNORMAL LOW (ref 39.0–52.0)
Hemoglobin: 9.2 g/dL — ABNORMAL LOW (ref 13.0–17.0)
MCH: 31.8 pg (ref 26.0–34.0)
MCHC: 32.4 g/dL (ref 30.0–36.0)
MCV: 98.3 fL (ref 80.0–100.0)
Platelets: 128 10*3/uL — ABNORMAL LOW (ref 150–400)
RBC: 2.89 MIL/uL — ABNORMAL LOW (ref 4.22–5.81)
RDW: 15.9 % — ABNORMAL HIGH (ref 11.5–15.5)
WBC: 20.1 10*3/uL — ABNORMAL HIGH (ref 4.0–10.5)
nRBC: 0 % (ref 0.0–0.2)

## 2021-05-23 LAB — MAGNESIUM: Magnesium: 2.1 mg/dL (ref 1.7–2.4)

## 2021-05-23 LAB — PHOSPHORUS: Phosphorus: 3.9 mg/dL (ref 2.5–4.6)

## 2021-05-23 NOTE — Progress Notes (Signed)
eLink Physician-Brief Progress Note Patient Name: Brent Yu DOB: 02-19-1933 MRN: [id number]   Date of Service  05/23/2021  HPI/Events of Note  Patient needs restraints order renewed to prevent self-extubation.  eICU Interventions  Restraints order renewed.        Thomasene Lot Laporsha Grealish 05/23/2021, 6:57 AM

## 2021-05-23 NOTE — Progress Notes (Signed)
Nutrition Follow-up  DOCUMENTATION CODES:   Non-severe (moderate) malnutrition in context of chronic illness  INTERVENTION:   Continue tube feeding via OG tube: Vital AF 1.2 at 65 ml/h (1560 ml per day)  Provides 1872 kcal (2400 kcal total with propofol), 117 gm protein, 1265 ml free water daily.  NUTRITION DIAGNOSIS:   Moderate Malnutrition related to chronic illness (asthma) as evidenced by mild muscle depletion, moderate muscle depletion, mild fat depletion, moderate fat depletion.  Ongoing  GOAL:   Patient will meet greater than or equal to 90% of their needs  Met with TF  MONITOR:   TF tolerance, Vent status, Labs  REASON FOR ASSESSMENT:   Ventilator, Consult Enteral/tube feeding initiation and management  ASSESSMENT:   87 yo male admitted S/P fall out of bed. Admitted with sepsis and found to have extensive PNA. Required intubation on admission. PMH includes Gilbert's syndrome, asthma, SIADH, esophagitis, HLD, HTN, hypothyroidism.  Discussed patient in ICU rounds today. Tolerating TF without difficulty. Remains on vasopressin, Levophed. Propofol initiated 12/4.  Lasix is being held d/t AKI.   Currently receiving Vital AF 1.2 at 65 ml/h.  Tolerating well.   Patient remains intubated on ventilator support MV: 12.4 L/min Temp (24hrs), Avg:98.8 F (37.1 C), Min:97.9 F (36.6 C), Max:99.7 F (37.6 C)  Propofol at 20 ml/h providing 528 kcal from lipid.   Labs reviewed. Na 146 CBG: [phone]  Medications reviewed and include Novolog, Protonix, Fentanyl, Levophed, propofol, vasopressin.  I/O +17 L since admission Admission weight 71.7 kg Current weight 83.7 kg  Diet Order:   Diet Order             Diet NPO time specified  Diet effective now                   EDUCATION NEEDS:   No education needs have been identified at this time  Skin:  Skin Assessment: Reviewed RN Assessment  Last BM:  12/5  Height:   Ht Readings from Last 1  Encounters:  05/22/21 _0  (1.753 m)    Weight:   Wt Readings from Last 1 Encounters:  05/23/21 83.7 kg    BMI:  Body mass index is 27.25 kg/m.  Estimated Nutritional Needs:   Kcal:  1900-2100  Protein:  105-120 gm  Fluid:  >/= 2 L    Lucas Mallow, RD, LDN, CNSC Please refer to Amion for contact information.

## 2021-05-23 NOTE — Progress Notes (Signed)
NAME:  Brent Yu, MRN:  [id number], DOB:  08-06-1932, LOS: 7 ADMISSION DATE:  04/25/2021, CONSULTATION DATE:  11/29 REFERRING MD:  Grandville Silos, CHIEF COMPLAINT:  resp failure, sepsis, AMS   History of Present Illness:  Brent Yu is a 87 y.o. M who began having jerky movements ~11/26 while walking and talking. Originally he and his wife had agreed to seek medical attention for these symptoms 11/29 however he fell out of bed and struck his head after waking early 11/29. He was brought to [facility] who reported an initial BP of 50/30, hypoxia in the 70's on room air, AMS, and evidence of head trauma. He received epi en route and given 400 cc NS bolus to [facility] ED as a trauma patient. He was assessed and cleared from a trauma standpoint however he was found to have leukocytosis, bilateral airspace disease on CT, and hypotension.  He was intubated and central line was placed. PCCM was consulted for further management  Pertinent  Medical History  Gilbert's syndrome, HTN, HLD, hypothyroidism, SIADH, and urinary incontinence   Significant Hospital Events: Including procedures, antibiotic start and stop dates in addition to other pertinent events   05/11/2021 Intubated 04/29/2021 Started on vasopressors 04/18/2021 CT of the head neck negative, CT of chest with bilateral airspace disease 05/18/2021 Added vasopressin for worsening shock 05/19/2021 Improving pressor requirement  12/5 Increasing levo and vaso requirements overnight and sedation  Interim History / Subjective:  Seen and evaluated at the bedside. Sedated and remains on the ventilator.   Objective   Blood pressure (!) 104/48, pulse 63, temperature 99.7 F (37.6 C), temperature source Oral, resp. rate 16, height 5\' 9"  (1.753 m), weight 83.7 kg, SpO2 98 %.    Vent Mode: PRVC FiO2 (%):  [50 %-80 %] 75 % Set Rate:  [20 bmp] 20 bmp Vt Set:  [550 mL] 550 mL PEEP:  [8 cmH20] 8 cmH20 Plateau Pressure:  [22 cmH20-29 cmH20] 29 cmH20    Intake/Output Summary (Last 24 hours) at 05/23/2021 0637 Last data filed at 05/23/2021 0600 Gross per 24 hour  Intake 3003.5 ml  Output 1350 ml  Net 1653.5 ml   Filed Weights   05/21/21 0500 05/22/21 0432 05/23/21 0500  Weight: 82.8 kg 82 kg 83.7 kg    Examination: General: ill appearing, NAD. Sedated.  HENT: [place], AT. MMM. Intubated Lungs: Coarse/diminished breath sounds b/l Cardiovascular: RRR, no murmurs Abdomen: soft, nontender, +BS Extremities: No LE edema Neuro: Sedated, not following commands GU: Foley  Resolved Hospital Problem list   Hyperkalemia Hypocalcemia  Assessment & Plan:  Acute hypoxic respiratory failure 2/2 to MSSA/E coli/H. Influenza pneumonia Patient remains dependent on  mechanical ventilation: PRVC with RR of 20, TV 550, PEEP 8, and FiO2 55-65%- plateau pressure 28. Repeat CXR today with improved infiltrates. - Completed course of azithromycin on 12/3 and cefepime on 12/5 - Duonebs PRN - Wean PEEP and FiO2 as tolerated: FiO2 40% and PEEP 5 goal today - Maintain SpO2 > 90%   Septic shock 2/2 PNA - Maintain MAP >65 - Continue levophed and vasopressin, wean as able - s/p abx therapy as above - Check CVP   Acute encephalopathy EEG 11/29 with diffuse encephalopathy, negative for seizures   Afib with RVR > in sinus rhythm now Continue amiodarone 400 mg daily x 7 days, transition to 200 mg on 12/10    Acute renal insufficiency ~1.3 L UOP in the last 24 hrs, although net +16.7 L this admission. Cr continues to  increase: 2.35>2.85>3.14 despite holding lasix yesterday.  - Continue to monitor renal function - Avoid nephrotoxic agents - Hold lasix again today   History of Gilbert's syndrome - Monitor LFTs   Hypothyroidism - Continue home levothyroxine 25 mcg daily   SIADH Sodium slightly elevated today at 146. Patient takes salt tabs at home and was continued on this during hospitalization  Best Practice (right click and "Reselect all SmartList  Selections" daily)   Diet/type: tubefeeds DVT prophylaxis: prophylactic heparin  GI prophylaxis: PPI Lines: Central line Foley:  Yes, and it is still needed Code Status:  full code Last date of multidisciplinary goals of care discussion [Family updated 12/5]  Labs   CBC: Recent Labs  Lab 05/19/21 0738 05/20/21 0455 05/20/21 1546 05/21/21 0244 05/22/21 0245 05/23/21 0304  WBC 8.9 10.2  --  12.9* 16.2* 20.1*  HGB 11.2* 10.3* 10.9* 10.1* 9.1* 9.2*  HCT 34.8* 30.5* 32.0* 30.4* 27.5* 28.4*  MCV 97.8 96.8  --  95.6 95.2 98.3  PLT 109* 88*  --  98* 110* 128*    Basic Metabolic Panel: Recent Labs  Lab 04/24/2021 0902 05/17/21 0403 05/17/21 0413 05/17/21 0510 05/19/21 0357 05/19/21 0738 05/20/21 0455 05/20/21 1546 05/21/21 0244 05/22/21 0245 05/23/21 0304  NA  --    < > 136   < >  --  136 141 145 141 143 146*  K  --    < > 6.0*   < >  --  4.0 3.8 4.0 4.0 4.0 4.4  CL  --   --  110   < >  --  108 114*  --  112* 111 112*  CO2  --   --  16*   < >  --  19* 19*  --  20* 24 22  GLUCOSE  --   --  154*   < >  --  182* 225*  --  214* 171* 167*  BUN  --   --  59*   < >  --  89* 78*  --  76* 84* 90*  CREATININE  --   --  2.88*   < >  --  2.73* 2.35*  --  2.39* 2.85* 3.14*  CALCIUM  --   --  7.4*   < >  --  6.6* 6.4*  --  7.3* 7.6* 7.9*  MG 1.4*  --  2.2  --  2.2  --   --   --   --   --   --   PHOS 1.5*  --  3.7  --   --   --   --   --   --   --   --    < > = values in this interval not displayed.   GFR: Estimated Creatinine Clearance: 16.9 mL/min (A) (by C-G formula based on SCr of 3.14 mg/dL (H)). Recent Labs  Lab 05/03/2021 0830 04/20/2021 0902 05/09/2021 1125 05/17/21 0413 05/17/21 0413 05/18/21 0440 05/19/21 0738 05/19/21 0807 05/19/21 1107 05/20/21 0455 05/21/21 0244 05/22/21 0245 05/23/21 0304  PROCALCITON  --  44.02  --  110.31  --  147.05  --   --   --   --   --   --   --   WBC  --   --   --  12.3*   < > 9.0   < >  --   --  10.2 12.9* 16.2* 20.1*  LATICACIDVEN 3.1*   --  2.8*  --   --   --   --  2.1* 1.3  --   --   --   --    < > = values in this interval not displayed.    Liver Function Tests: Recent Labs  Lab 05/21/21 0244  AST 55*  ALT 126*  ALKPHOS 128*  BILITOT 1.1  PROT 5.3*  ALBUMIN 1.6*   Recent Labs  Lab 05/02/2021 0911  LIPASE 37  AMYLASE 52   No results for input(s): AMMONIA in the last 168 hours.  ABG    Component Value Date/Time   PHART 7.356 05/20/2021 1546   PCO2ART 36.8 05/20/2021 1546   PO2ART 71 (L) 05/20/2021 1546   HCO3 20.7 05/20/2021 1546   TCO2 22 05/20/2021 1546   ACIDBASEDEF 4.0 (H) 05/20/2021 1546   O2SAT 94.0 05/20/2021 1546     Coagulation Profile: No results for input(s): INR, PROTIME in the last 168 hours.  Cardiac Enzymes: No results for input(s): CKTOTAL, CKMB, CKMBINDEX, TROPONINI in the last 168 hours.  HbA1C: No results found for: HGBA1C  CBG: Recent Labs  Lab 05/22/21 1108 05/22/21 1530 05/22/21 1913 05/22/21 2359 05/23/21 0317  GLUCAP 134* 139* 162* 147* 158*    Review of Systems:     Family History:  His family history is not on file.   Allergies Allergies  Allergen Reactions   Acetaminophen     Can't take due to liver function.   Atorvastatin     Elevated liver function active   Hydrochlorothiazide     Hyponatremia active   Lisinopril Cough     Home Medications  Prior to Admission medications   Medication Sig Start Date End Date Taking? Authorizing Provider  ADVAIR DISKUS 250-50 MCG/ACT AEPB Inhale 1 puff into the lungs in the morning and at bedtime. 03/23/21  Yes [provider]  albuterol (VENTOLIN HFA) 108 (90 Base) MCG/ACT inhaler Inhale 1 puff into the lungs every 6 (six) hours as needed for wheezing or shortness of breath.   Yes [provider]  amLODipine (NORVASC) 5 MG tablet Take 5 mg by mouth daily. 04/03/21  Yes [provider]  fluticasone (FLONASE) 50 MCG/ACT nasal spray Place 2 sprays into both nostrils daily. 04/12/21  Yes  [provider]  gabapentin (NEURONTIN) 800 MG tablet Take 800 mg by mouth 3 (three) times daily. 02/22/21  Yes [provider]  ibuprofen (ADVIL) 200 MG tablet Take 200 mg by mouth every 6 (six) hours as needed for fever, mild pain or moderate pain.   Yes [provider]  levocetirizine (XYZAL) 5 MG tablet Take 5 mg by mouth every evening. 02/27/21  Yes [provider]  levothyroxine (SYNTHROID) 25 MCG tablet Take 25 mcg by mouth every morning. Takes @ 0300 QD-scheduled. 04/20/21  Yes [provider]  mometasone (ELOCON) 0.1 % cream Apply 1 application topically daily as needed (undiagnosed inflammatory/allergy related skin condition.). 11/22/20  Yes [provider]  montelukast (SINGULAIR) 10 MG tablet Take 10 mg by mouth every evening. 04/03/21  Yes [provider]  Multiple Vitamins-Minerals (CENTRUM SILVER 50+MEN PO) Take 1 tablet by mouth daily.   Yes [provider]  omeprazole (PRILOSEC) 20 MG capsule Take 20 mg by mouth daily.   Yes [provider]  Propylene Glycol (SYSTANE COMPLETE OP) Place 1 drop into both eyes daily.   Yes [provider]  rosuvastatin (CRESTOR) 10 MG tablet Take 10 mg by mouth daily. 05/05/21  Yes [provider]  sildenafil (REVATIO) 20 MG tablet Take 10 mg by mouth daily  as needed (erectile dysfunction).   Yes [provider]  sodium chloride 1 g tablet Take 2 g by mouth 2 (two) times daily with a meal.   Yes [provider]  telmisartan (MICARDIS) 80 MG tablet Take 80 mg by mouth daily. 03/01/21  Yes [provider]     Critical care time: 25 minutes

## 2021-05-24 ENCOUNTER — Inpatient Hospital Stay (HOSPITAL_COMMUNITY): Payer: Medicare Other

## 2021-05-24 DIAGNOSIS — N179 Acute kidney failure, unspecified: Secondary | ICD-10-CM

## 2021-05-24 DIAGNOSIS — J9601 Acute respiratory failure with hypoxia: Secondary | ICD-10-CM | POA: Diagnosis not present

## 2021-05-24 DIAGNOSIS — Z515 Encounter for palliative care: Secondary | ICD-10-CM

## 2021-05-24 DIAGNOSIS — J9602 Acute respiratory failure with hypercapnia: Secondary | ICD-10-CM | POA: Diagnosis not present

## 2021-05-24 DIAGNOSIS — Z7189 Other specified counseling: Secondary | ICD-10-CM

## 2021-05-24 LAB — POCT I-STAT 7, (LYTES, BLD GAS, ICA,H+H)
Acid-base deficit: 3 mmol/L — ABNORMAL HIGH (ref 0.0–2.0)
Bicarbonate: 23 mmol/L (ref 20.0–28.0)
Calcium, Ion: 1.1 mmol/L — ABNORMAL LOW (ref 1.15–1.40)
HCT: 29 % — ABNORMAL LOW (ref 39.0–52.0)
Hemoglobin: 9.9 g/dL — ABNORMAL LOW (ref 13.0–17.0)
O2 Saturation: 84 %
Patient temperature: 99.7
Potassium: 5.4 mmol/L — ABNORMAL HIGH (ref 3.5–5.1)
Sodium: 146 mmol/L — ABNORMAL HIGH (ref 135–145)
TCO2: 24 mmol/L (ref 22–32)
pCO2 arterial: 47.9 mmHg (ref 32.0–48.0)
pH, Arterial: 7.292 — ABNORMAL LOW (ref 7.350–7.450)
pO2, Arterial: 56 mmHg — ABNORMAL LOW (ref 83.0–108.0)

## 2021-05-24 LAB — CBC
HCT: 27.3 % — ABNORMAL LOW (ref 39.0–52.0)
Hemoglobin: 9.1 g/dL — ABNORMAL LOW (ref 13.0–17.0)
MCH: 32 pg (ref 26.0–34.0)
MCHC: 33.3 g/dL (ref 30.0–36.0)
MCV: 96.1 fL (ref 80.0–100.0)
Platelets: 155 K/uL (ref 150–400)
RBC: 2.84 MIL/uL — ABNORMAL LOW (ref 4.22–5.81)
RDW: 16.1 % — ABNORMAL HIGH (ref 11.5–15.5)
WBC: 18.9 K/uL — ABNORMAL HIGH (ref 4.0–10.5)
nRBC: 0 % (ref 0.0–0.2)

## 2021-05-24 LAB — RENAL FUNCTION PANEL
Albumin: <1.5 g/dL — ABNORMAL LOW (ref 3.5–5.0)
Anion gap: 15 (ref 5–15)
BUN: 114 mg/dL — ABNORMAL HIGH (ref 8–23)
CO2: 21 mmol/L — ABNORMAL LOW (ref 22–32)
Calcium: 7.6 mg/dL — ABNORMAL LOW (ref 8.9–10.3)
Chloride: 107 mmol/L (ref 98–111)
Creatinine, Ser: 4.6 mg/dL — ABNORMAL HIGH (ref 0.61–1.24)
GFR, Estimated: 12 mL/min — ABNORMAL LOW (ref >60)
Glucose, Bld: 116 mg/dL — ABNORMAL HIGH (ref 70–99)
Phosphorus: 5.2 mg/dL — ABNORMAL HIGH (ref 2.5–4.6)
Potassium: 5.6 mmol/L — ABNORMAL HIGH (ref 3.5–5.1)
Sodium: 143 mmol/L (ref 135–145)

## 2021-05-24 LAB — GLUCOSE, CAPILLARY
Glucose-Capillary: 137 mg/dL — ABNORMAL HIGH (ref 70–99)
Glucose-Capillary: 149 mg/dL — ABNORMAL HIGH (ref 70–99)
Glucose-Capillary: 139 mg/dL — ABNORMAL HIGH (ref 70–99)
Glucose-Capillary: 91 mg/dL (ref 70–99)
Glucose-Capillary: 98 mg/dL (ref 70–99)
Glucose-Capillary: 129 mg/dL — ABNORMAL HIGH (ref 70–99)

## 2021-05-24 LAB — MAGNESIUM: Magnesium: 2.1 mg/dL (ref 1.7–2.4)

## 2021-05-24 LAB — BASIC METABOLIC PANEL
Anion gap: 12 (ref 5–15)
BUN: 111 mg/dL — ABNORMAL HIGH (ref 8–23)
CO2: 22 mmol/L (ref 22–32)
Calcium: 7.9 mg/dL — ABNORMAL LOW (ref 8.9–10.3)
Chloride: 110 mmol/L (ref 98–111)
Creatinine, Ser: 4.03 mg/dL — ABNORMAL HIGH (ref 0.61–1.24)
GFR, Estimated: 14 mL/min — ABNORMAL LOW (ref >60)
Glucose, Bld: 132 mg/dL — ABNORMAL HIGH (ref 70–99)
Potassium: 5.1 mmol/L (ref 3.5–5.1)
Sodium: 144 mmol/L (ref 135–145)

## 2021-05-24 LAB — TROPONIN I (HIGH SENSITIVITY)
Troponin I (High Sensitivity): 58 ng/L — ABNORMAL HIGH
Troponin I (High Sensitivity): 69 ng/L — ABNORMAL HIGH

## 2021-05-24 LAB — PHOSPHORUS: Phosphorus: 4.7 mg/dL — ABNORMAL HIGH (ref 2.5–4.6)

## 2021-05-24 MED ORDER — PRISMASOL BGK 0/2.5 32-2.5 MEQ/L EC SOLN
Status: DC
  Filled 2021-05-24 (×4): qty 5000

## 2021-05-24 MED ORDER — POLYETHYLENE GLYCOL 3350 17 G PO PACK
17.0000 g | PACK | Freq: Every day | ORAL | Status: DC
  Administered 2021-05-25 – 2021-05-27 (×3): 17 g
  Filled 2021-05-24 (×3): qty 1

## 2021-05-24 MED ORDER — HEPARIN SODIUM (PORCINE) 1000 UNIT/ML DIALYSIS
1000.0000 [IU] | INTRAMUSCULAR | Status: DC | PRN
  Administered 2021-05-24 – 2021-05-26 (×2): 2400 [IU] via INTRAVENOUS_CENTRAL
  Filled 2021-05-24 (×4): qty 6

## 2021-05-24 MED ORDER — PRISMASOL BGK 0/2.5 32-2.5 MEQ/L EC SOLN
Status: DC
  Filled 2021-05-24 (×6): qty 5000

## 2021-05-24 MED ORDER — MIDAZOLAM HCL 2 MG/2ML IJ SOLN: 4.0000 mg | Freq: Once | INTRAMUSCULAR | Status: DC

## 2021-05-24 MED ORDER — PRISMASOL BGK 0/2.5 32-2.5 MEQ/L EC SOLN
Status: DC
  Filled 2021-05-24 (×36): qty 5000

## 2021-05-24 NOTE — Procedures (Signed)
Central Venous Catheter Insertion Procedure Note  Brent Yu  [id number]  [birth year]  Date:05/24/21  Time:3:23 PM   Provider Performing:[name] Wilford Grist   Procedure: Insertion of Non-tunneled Central Venous Catheter(36556)with US guidance (98921)    Indication(s) Hemodialysis  Consent Risks of the procedure as well as the alternatives and risks of each were explained to the patient and/or caregiver.  Consent for the procedure was obtained and is signed in the bedside chart  Anesthesia Topical only with 1% lidocaine   Timeout Verified patient identification, verified procedure, site/side was marked, verified correct patient position, special equipment/implants available, medications/allergies/relevant history reviewed, required imaging and test results available.  Sterile Technique Maximal sterile technique including full sterile barrier drape, hand hygiene, sterile gown, sterile gloves, mask, hair covering, sterile ultrasound probe cover (if used).  Procedure Description Area of catheter insertion was cleaned with chlorhexidine and draped in sterile fashion.   With real-time ultrasound guidance a 15 cm trialysis HD catheter was placed into the right internal jugular vein.  Nonpulsatile blood flow and easy flushing noted in all ports.  The catheter was sutured in place, biopatch, and sterile dressing applied.    1.2 mL of 1:1000 heparin instilled into each blue and red port.  Complications/Tolerance None; patient tolerated the procedure well. Chest X-ray is ordered to verify placement for internal jugular or subclavian cannulation.  Chest x-ray is not ordered for femoral cannulation.  EBL Minimal  Specimen(s) None

## 2021-05-24 NOTE — Consult Note (Signed)
[facility] Renal Consultation Note    Indication for Consultation:  Management of AKI  PCP:No primary care provider on file.  HPI: Brent Yu is a 87 y.o. male with past medical history significant for HTN, HLD, Gilbert's syndrome, Hypothyroidism, SIADH and urinary incontinence. He presents to the ED on 11/29 for chief complaint of fall after falling out of bed and hitting his head, subsequently became AMS. EMS transported pt to ED. 12/6 showed worsening renal function and we were consulted.   11/29 ED initial BP 50/30, O2 70% on RA. Pt was intubated, vasopressors started, central line placed. ED labwork revealed Hgb 12.0, Lactic 3.6, BUN 48, Crt 3.70. CT Head negative for acute intracranial changes. EKG was NSR. CXR revealed bibasilar consolidation with patchy airspace disease in right mid lung. CT Chest revealed bilateral lower opacities consistent with PNA. Pt admitted to the ICU for further workup.   12/1-12/5 pressor adjustments were made. 12/6 revealed worsening renal function with BUN 90 (48 on 11/29), Crt 3.14 (3.70 on 11/29, trending down to 2.85 on 12/5).   Pt seen this afternoon. Unable to obtain hx or ROS due to pt status.   No past medical history on file.  No family history on file. Social History:  has no history on file for tobacco use, alcohol use, and drug use. Allergies  Allergen Reactions   Acetaminophen     Can't take due to liver function.   Atorvastatin     Elevated liver function active   Hydrochlorothiazide     Hyponatremia active   Lisinopril Cough   Prior to Admission medications   Medication Sig Start Date End Date Taking? Authorizing Provider  ADVAIR DISKUS 250-50 MCG/ACT AEPB Inhale 1 puff into the lungs in the morning and at bedtime. 03/23/21  Yes [provider]  albuterol (VENTOLIN HFA) 108 (90 Base) MCG/ACT inhaler Inhale 1 puff into the lungs every 6 (six) hours as needed for wheezing or shortness of breath.   Yes  [provider]  amLODipine (NORVASC) 5 MG tablet Take 5 mg by mouth daily. 04/03/21  Yes [provider]  fluticasone (FLONASE) 50 MCG/ACT nasal spray Place 2 sprays into both nostrils daily. 04/12/21  Yes [provider]  gabapentin (NEURONTIN) 800 MG tablet Take 800 mg by mouth 3 (three) times daily. 02/22/21  Yes [provider]  ibuprofen (ADVIL) 200 MG tablet Take 200 mg by mouth every 6 (six) hours as needed for fever, mild pain or moderate pain.   Yes [provider]  levocetirizine (XYZAL) 5 MG tablet Take 5 mg by mouth every evening. 02/27/21  Yes [provider]  levothyroxine (SYNTHROID) 25 MCG tablet Take 25 mcg by mouth every morning. Takes @ 0300 QD-scheduled. 04/20/21  Yes [provider]  mometasone (ELOCON) 0.1 % cream Apply 1 application topically daily as needed (undiagnosed inflammatory/allergy related skin condition.). 11/22/20  Yes [provider]  montelukast (SINGULAIR) 10 MG tablet Take 10 mg by mouth every evening. 04/03/21  Yes [provider]  Multiple Vitamins-Minerals (CENTRUM SILVER 50+MEN PO) Take 1 tablet by mouth daily.   Yes [provider]  omeprazole (PRILOSEC) 20 MG capsule Take 20 mg by mouth daily.   Yes [provider]  Propylene Glycol (SYSTANE COMPLETE OP) Place 1 drop into both eyes daily.   Yes [provider]  rosuvastatin (CRESTOR) 10 MG tablet Take 10 mg by mouth daily. 05/05/21  Yes [provider]  sildenafil (REVATIO) 20 MG tablet  Take 10 mg by mouth daily as needed (erectile dysfunction).   Yes [provider]  sodium chloride 1 g tablet Take 2 g by mouth 2 (two) times daily with a meal.   Yes [provider]  telmisartan (MICARDIS) 80 MG tablet Take 80 mg by mouth daily. 03/01/21  Yes [provider]   Current Facility-Administered Medications  Medication Dose Route Frequency Provider Last Rate Last Admin    0.9 %  sodium chloride infusion  250 mL Intravenous Continuous Maudie Flakes, MD 10 mL/hr at 05/24/21 1200 Infusion Verify at 05/24/21 1200   0.9 %  sodium chloride infusion   Intra-arterial PRN Olalere, Adewale A, MD       amiodarone (PACERONE) tablet 400 mg  400 mg Per Tube BID Margaretha Seeds, MD   400 mg at 05/24/21 1779   Followed by   Derrill Memo ON 05/27/2021] amiodarone (PACERONE) tablet 200 mg  200 mg Per Tube Daily Margaretha Seeds, MD       chlorhexidine gluconate (MEDLINE KIT) (PERIDEX) 0.12 % solution 15 mL  15 mL Mouth Rinse BID Olalere, Adewale A, MD   15 mL at 05/24/21 0858   Chlorhexidine Gluconate Cloth 2 % PADS 6 each  6 each Topical Q0600 Olalere, Adewale A, MD   6 each at 05/23/21 1054   docusate (COLACE) 50 MG/5ML liquid 100 mg  100 mg Per Tube BID PRN Minor, Grace Bushy, NP   100 mg at 05/21/21 1758   feeding supplement (VITAL AF 1.2 CAL) liquid 1,000 mL  1,000 mL Per Tube Continuous Olalere, Adewale A, MD   Stopped at 05/24/21 0845   fentaNYL (SUBLIMAZE) 5000 mcg / 100 mL (50 mcg/mL) infusion  25-300 mcg/hr Intravenous Continuous Margaretha Seeds, MD 4 mL/hr at 05/24/21 1200 200 mcg/hr at 05/24/21 1200   fentaNYL (SUBLIMAZE) bolus via infusion 25-100 mcg  25-100 mcg Intravenous Q15 min PRN Minor, Grace Bushy, NP   100 mcg at 05/23/21 1217   heparin injection 5,000 Units  5,000 Units Subcutaneous Q8H Minor, Grace Bushy, NP   5,000 Units at 05/24/21 3903   insulin aspart (novoLOG) injection 0-9 Units  0-9 Units Subcutaneous Q4H Olalere, Adewale A, MD   1 Units at 05/24/21 1150   iohexol (OMNIPAQUE) 350 MG/ML injection 100 mL  100 mL Intravenous Once PRN Maudie Flakes, MD       ipratropium-albuterol (DUONEB) 0.5-2.5 (3) MG/3ML nebulizer solution 3 mL  3 mL Nebulization Q4H PRN Minor, Grace Bushy, NP       levothyroxine (SYNTHROID) tablet 25 mcg  25 mcg Per Tube Q0600 Olalere, Adewale A, MD   25 mcg at 05/24/21 0092   MEDLINE mouth rinse  15 mL Mouth Rinse 10 times per day Olalere,  Adewale A, MD   15 mL at 05/24/21 1152   midazolam (VERSED) injection 2 mg  2 mg Intravenous Q1H PRN Elsie Lincoln, MD   2 mg at 05/21/21 1011   norepinephrine (LEVOPHED) 16 mg in 270m premix infusion  0-40 mcg/min Intravenous Titrated WJerilynn Birkenhead RPH 1.88 mL/hr at 05/24/21 1200 2 mcg/min at 05/24/21 1200   pantoprazole sodium (PROTONIX) 40 mg/20 mL oral suspension 40 mg  40 mg Per Tube QHS Mannam, Praveen, MD   40 mg at 05/23/21 2235   polyethylene glycol (MIRALAX / GLYCOLAX) packet 17 g  17 g Per Tube Daily PRN Minor, WGrace Bushy NP       propofol (DIPRIVAN) 1000 MG/100ML infusion  5-80 mcg/kg/min Intravenous  Titrated Margaretha Seeds, MD 19.87 mL/hr at 05/24/21 1200 40 mcg/kg/min at 05/24/21 1200   sodium chloride flush (NS) 0.9 % injection 10-40 mL  10-40 mL Intracatheter Q12H Olalere, Adewale A, MD   10 mL at 05/23/21 2219   sodium chloride flush (NS) 0.9 % injection 10-40 mL  10-40 mL Intracatheter PRN Olalere, Adewale A, MD       sodium chloride flush (NS) 0.9 % injection 10-40 mL  10-40 mL Intracatheter Q12H Olalere, Adewale A, MD   10 mL at 05/24/21 0925   sodium chloride flush (NS) 0.9 % injection 10-40 mL  10-40 mL Intracatheter PRN Olalere, Adewale A, MD       vasopressin (PITRESSIN) 20 Units in sodium chloride 0.9 % 100 mL infusion-*FOR SHOCK*  0-0.04 Units/min Intravenous Continuous Cecilie Lowers T, MD 12 mL/hr at 05/24/21 1200 0.04 Units/min at 05/24/21 1200   Labs: Basic Metabolic Panel: Recent Labs  Lab 05/22/21 0245 05/23/21 0304 05/24/21 0329 05/24/21 0956  NA 143 146* 144 146*  K 4.0 4.4 5.1 5.4*  CL 111 112* 110  --   CO2 _0 --   GLUCOSE 171* 167* 132*  --   BUN 84* 90* 111*  --   CREATININE 2.85* 3.14* 4.03*  --   CALCIUM 7.6* 7.9* 7.9*  --   PHOS  --  3.9 4.7*  --    Liver Function Tests: Recent Labs  Lab 05/21/21 0244  AST 55*  ALT 126*  ALKPHOS 128*  BILITOT 1.1  PROT 5.3*  ALBUMIN 1.6*   CBC: Recent Labs  Lab 05/20/21 0455  05/20/21 1546 05/21/21 0244 05/22/21 0245 05/23/21 0304 05/24/21 0329 05/24/21 0956  WBC 10.2  --  12.9* 16.2* 20.1* 18.9*  --   HGB 10.3*   < > 10.1* 9.1* 9.2* 9.1* 9.9*  HCT 30.5*   < > 30.4* 27.5* 28.4* 27.3* 29.0*  MCV 96.8  --  95.6 95.2 98.3 96.1  --   PLT 88*  --  98* 110* 128* 155  --    < > = values in this interval not displayed.   CBG: Recent Labs  Lab 05/23/21 1901 05/23/21 2313 05/24/21 0308 05/24/21 0704 05/24/21 1105  GLUCAP 137* 148* 137* 149* 139*   Studies/Results: DG CHEST PORT 1 VIEW  Result Date: 05/23/2021 CLINICAL DATA:  Respiratory failure.  Ventilator support. EXAM: PORTABLE CHEST 1 VIEW COMPARISON:  05/20/2021 FINDINGS: Endotracheal tube tip is 5 cm above the carina. Orogastric or nasogastric tube enters the stomach. Right arm PICC tip is at the SVC RA junction. Pulmonary infiltrate and volume loss in the right mid lung and left lower lobe persists but may be becoming less dense. IMPRESSION: Lines and tubes satisfactory. Persistent bilateral pulmonary infiltrates consistent with pneumonia. Question if the infiltrates are becoming less dense. Electronically Signed   By: Nelson Chimes M.D.   On: 05/23/2021 08:39    ROS: Unable to obtain due to pt status.   Physical Exam: Vitals:   05/24/21 1130 05/24/21 1145 05/24/21 1200 05/24/21 1215  BP: (!) 111/50 (!) 121/51 (!) 119/46 (!) 115/51  Pulse: 66 72 69 69  Resp: (!) 21 (!) 21 (!) 23 (!) 22  Temp:      TempSrc:      SpO2: 94% 94% 94% 94%  Weight:      Height:         General: Chronic, ill-appearing, intubation tube and orogastric tube in place Head: NCAT sclera not icteric MMM  Neck: Supple. No lymphadenopathy Lungs: CTA bilaterally. No wheeze, rales or rhonchi. Breathing is unlabored. Heart: RRR. No murmur, rubs or gallops.  Abdomen: soft, nontender, distended Lower extremities: BL 1+ hip edema, BL pretibial trace edema  Assessment/Plan: AKI likely 2/2 ATN/septic shock- 12/7 BUN 111 (48 on  11/29), Crt 4.03 (3.70 on 11/29, trending down to 2.85 on 12/5). Na 146 on 12/7. K 5.4 on 12/7. Most recent UO 725.  Would recommend starting CRRT, which pt's wife agrees to. Obtain urinalysis, last urinalysis 11/29 which revealed proteinuria, hematuria.  Acute hypoxic respiratory failure 2/2 MSSA/E coli/H Influenza PNA- Azithromycin and Cefepime treatments completed. Continue intubation. Follow respiratory recommendations.  Septic Shock 2/2 PNA- continue pressors, abx course completed Acute encephalopathy- 11/29 EEG revealed diffuse encephalopathy, negative for seizures.  Hypertension/volume  - Volume overload on PE. BP soft currently. Continue pressors. Continue holding Lasix. CRRT should help with volume overload.  Anemia of Chronic Disease - Hgb 9.9 on 12/7. Transfuse if Hgb <7.  Hypocalcemia, Hyperphosphatemia  - 12/4 corrected calcium within normal limits. 12/7 Ca 7.9, Phos 4.7. Would recommend repeat Albumin for corrected calcium. Continue monitoring Ca, Phos levels.  Nutrition - 12/4 albumin 1.6. Continue feeding supplement. Would recommend giving nepro.Trend albumin in AM.  Hyperglycemia- Obtain A1C. Continue SSI.  HLD- continue Crestor Gilbert's syndrome- monitor LFT's and bilirubin Hypothyroidism- Continue levothyroxine.  SIADH- Na 146 on 12/7, continue holding salt tablets.   Josefa Half, PA-S [facility] Physician Assistant Studies 05/24/2021, 1:02 PM     Agree with assessment and plan as outlined above by Brent Chipps PA-S. AKI likely secondary to ATN/septic shock. Discussed with wife Brent Yu over the phone earlier today. She wishes to trial dialysis in the meantime. Given that his urine output is diminishing, now becoming more hyperkalemic, along with a rising BUN would recommend initiating CRRT in the interim. Appreciate PCCM's assistance with temp line placement. Discussed with primary service.

## 2021-05-24 NOTE — Progress Notes (Signed)
NAME:  Brent Yu, MRN:  [id number], DOB:  1933/06/08, LOS: 8 ADMISSION DATE:  05/04/2021, CONSULTATION DATE:  11/29 REFERRING MD:  Janee Morn, CHIEF COMPLAINT:  resp failure, sepsis, AMS   History of Present Illness:  Brent Yu is a 87 y.o. M who began having jerky movements ~11/26 while walking and talking. Originally he and his wife had agreed to seek medical attention for these symptoms 11/29 however he fell out of bed and struck his head after waking early 11/29. He was brought to [facility]IB EMS who reported an initial BP of 50/30, hypoxia in the 70's on room air, AMS, and evidence of head trauma. He received epi en route and given 400 cc NS bolus to [facility] ED as a trauma patient. He was assessed and cleared from a trauma standpoint however he was found to have leukocytosis, bilateral airspace disease on CT, and hypotension.  He was intubated and central line was placed. PCCM was consulted for further management  Pertinent  Medical History  Gilbert's syndrome, HTN, HLD, hypothyroidism, SIADH, and urinary incontinence   Significant Hospital Events: Including procedures, antibiotic start and stop dates in addition to other pertinent events   04/28/2021 Intubated 05/05/2021 Started on vasopressors 05/09/2021 CT of the head neck negative, CT of chest with bilateral airspace disease 05/18/2021 Added vasopressin for worsening shock 05/19/2021 Improving pressor requirement  12/5 Increasing levo and vaso requirements overnight and sedation 12/6 worsening kidney function, consulted nephro and palliative care  Interim History / Subjective:  Seen and evaluated at the bedside. Appears more diaphoretic, although resting comfortably and remains sedated.   Objective   Blood pressure (!) 112/44, pulse 63, temperature 99.8 F (37.7 C), temperature source Axillary, resp. rate 19, height 5\' 9"  (1.753 m), weight 84.1 kg, SpO2 91 %.    Vent Mode: PRVC FiO2 (%):  [40 %-65 %] 50 % Set Rate:  [20 bmp] 20  bmp Vt Set:  [550 mL] 550 mL PEEP:  [8 cmH20] 8 cmH20 Plateau Pressure:  [17 cmH20-33 cmH20] 24 cmH20   Intake/Output Summary (Last 24 hours) at 05/24/2021 0626 Last data filed at 05/24/2021 0400 Gross per 24 hour  Intake 2673.94 ml  Output 725 ml  Net 1948.94 ml   Filed Weights   05/22/21 0432 05/23/21 0500 05/24/21 0341  Weight: 82 kg 83.7 kg 84.1 kg    Examination: General: ill appearing, NAD. Remains sedated. Diaphoretic HENT: [place], AT. Intubated Lungs: Coarse breath sounds and crackles diffusely Cardiovascular: RRR, no murmurs Abdomen: Soft, nontender, nondistended, +BS Extremities: No LE edema Neuro: Sedated and not able to follow commands GU: External foley in place  Resolved Hospital Problem list   Hyperkalemia Hypocalcemia  Assessment & Plan:  Acute hypoxic respiratory failure 2/2 to MSSA/E coli/H. Influenza pneumonia Patient remains dependent on  mechanical ventilation: PRVC with RR of 20, TV 550, PEEP 8, and FiO2 50%-75%, plateau pressure of 24. Patient is s/p course of azithromycin on 12/3 and cefepime on 12/5 - Duonebs PRN - Wean PEEP and FiO2 as tolerated: FiO2 40% and PEEP 5 goal - Maintain SpO2 >90% - Palliative care consulted, appreciate assistance   Septic shock 2/2 PNA - Maintain MAP >65 - Continue levophed and vasopressin, wean as able - s/p abx therapy as above - Check CVP   Acute renal insufficiency ~700 cc UOP in the last 24 hrs, although net +18.7 L this admission. Cr continues to increase: 2.35>2.85>3.14>4 despite holding lasix the past 2 days.  Bladder scan with 30 cc urine.  -  Nephrology consulted, appreciate recs - Continue to monitor renal function - Avoid nephrotoxic agents - Repeat urinalysis - Renal ultrasound - Consider HD cath placement if continuing full scope of care  Questionable ST elevations  EKG with questionable ST elevations vs artifact vs PVCs. Troponin 58.  - Delta trop pending  Acute encephalopathy EEG 11/29 with  diffuse encephalopathy, negative for seizures   Afib with RVR > in sinus rhythm now Continue amiodarone 400 mg daily x 7 days, transition to 200 mg on 12/10    History of Gilbert's syndrome - Monitor LFTs   Hypothyroidism - Continue home levothyroxine 25 mcg daily   SIADH Sodium within normal limits. Patient takes salt tabs at home and was continued on this during hospitalization, although has been held in setting of hypernatremia.   Best Practice (right click and "Reselect all SmartList Selections" daily)   Diet/type: tubefeeds DVT prophylaxis: prophylactic heparin  GI prophylaxis: PPI Lines: Central line Foley:  Yes, and it is still needed Code Status:  full code Last date of multidisciplinary goals of care discussion [Family updated 12/6]  Labs   CBC: Recent Labs  Lab 05/20/21 0455 05/20/21 1546 05/21/21 0244 05/22/21 0245 05/23/21 0304 05/24/21 0329  WBC 10.2  --  12.9* 16.2* 20.1* 18.9*  HGB 10.3* 10.9* 10.1* 9.1* 9.2* 9.1*  HCT 30.5* 32.0* 30.4* 27.5* 28.4* 27.3*  MCV 96.8  --  95.6 95.2 98.3 96.1  PLT 88*  --  98* 110* 128* [id number]    Basic Metabolic Panel: Recent Labs  Lab 05/19/21 0357 05/19/21 0738 05/20/21 0455 05/20/21 1546 05/21/21 0244 05/22/21 0245 05/23/21 0304 05/24/21 0329  NA  --    < > 141 145 141 143 146* 144  K  --    < > 3.8 4.0 4.0 4.0 4.4 5.1  CL  --    < > 114*  --  112* 111 112* 110  CO2  --    < > 19*  --  20* 24 22 22   GLUCOSE  --    < > 225*  --  214* 171* 167* 132*  BUN  --    < > 78*  --  76* 84* 90* 111*  CREATININE  --    < > 2.35*  --  2.39* 2.85* 3.14* 4.03*  CALCIUM  --    < > 6.4*  --  7.3* 7.6* 7.9* 7.9*  MG 2.2  --   --   --   --   --  2.1 2.1  PHOS  --   --   --   --   --   --  3.9 4.7*   < > = values in this interval not displayed.   GFR: Estimated Creatinine Clearance: 13.2 mL/min (A) (by C-G formula based on SCr of 4.03 mg/dL (H)). Recent Labs  Lab 05/18/21 0440 05/19/21 0738 05/19/21 0807 05/19/21 1107  05/20/21 0455 05/21/21 0244 05/22/21 0245 05/23/21 0304 05/24/21 0329  PROCALCITON 147.05  --   --   --   --   --   --   --   --   WBC 9.0   < >  --   --    < > 12.9* 16.2* 20.1* 18.9*  LATICACIDVEN  --   --  2.1* 1.3  --   --   --   --   --    < > = values in this interval not displayed.    Liver Function Tests: Recent Labs  Lab 05/21/21 0244  AST 55*  ALT 126*  ALKPHOS 128*  BILITOT 1.1  PROT 5.3*  ALBUMIN 1.6*   No results for input(s): LIPASE, AMYLASE in the last 168 hours.  No results for input(s): AMMONIA in the last 168 hours.  ABG    Component Value Date/Time   PHART 7.356 05/20/2021 1546   PCO2ART 36.8 05/20/2021 1546   PO2ART 71 (L) 05/20/2021 1546   HCO3 20.7 05/20/2021 1546   TCO2 22 05/20/2021 1546   ACIDBASEDEF 4.0 (H) 05/20/2021 1546   O2SAT 94.0 05/20/2021 1546     Coagulation Profile: No results for input(s): INR, PROTIME in the last 168 hours.  Cardiac Enzymes: No results for input(s): CKTOTAL, CKMB, CKMBINDEX, TROPONINI in the last 168 hours.  HbA1C: No results found for: HGBA1C  CBG: Recent Labs  Lab 05/23/21 1139 05/23/21 1530 05/23/21 1901 05/23/21 2313 05/24/21 0308  GLUCAP 168* 112* 137* 148* 137*    Review of Systems:     Family History:  His family history is not on file.   Allergies Allergies  Allergen Reactions   Acetaminophen     Can't take due to liver function.   Atorvastatin     Elevated liver function active   Hydrochlorothiazide     Hyponatremia active   Lisinopril Cough     Home Medications  Prior to Admission medications   Medication Sig Start Date End Date Taking? Authorizing Provider  ADVAIR DISKUS 250-50 MCG/ACT AEPB Inhale 1 puff into the lungs in the morning and at bedtime. 03/23/21  Yes [provider]  albuterol (VENTOLIN HFA) 108 (90 Base) MCG/ACT inhaler Inhale 1 puff into the lungs every 6 (six) hours as needed for wheezing or shortness of breath.   Yes [provider]   amLODipine (NORVASC) 5 MG tablet Take 5 mg by mouth daily. 04/03/21  Yes [provider]  fluticasone (FLONASE) 50 MCG/ACT nasal spray Place 2 sprays into both nostrils daily. 04/12/21  Yes [provider]  gabapentin (NEURONTIN) 800 MG tablet Take 800 mg by mouth 3 (three) times daily. 02/22/21  Yes [provider]  ibuprofen (ADVIL) 200 MG tablet Take 200 mg by mouth every 6 (six) hours as needed for fever, mild pain or moderate pain.   Yes [provider]  levocetirizine (XYZAL) 5 MG tablet Take 5 mg by mouth every evening. 02/27/21  Yes [provider]  levothyroxine (SYNTHROID) 25 MCG tablet Take 25 mcg by mouth every morning. Takes @ 0300 QD-scheduled. 04/20/21  Yes [provider]  mometasone (ELOCON) 0.1 % cream Apply 1 application topically daily as needed (undiagnosed inflammatory/allergy related skin condition.). 11/22/20  Yes [provider]  montelukast (SINGULAIR) 10 MG tablet Take 10 mg by mouth every evening. 04/03/21  Yes [provider]  Multiple Vitamins-Minerals (CENTRUM SILVER 50+MEN PO) Take 1 tablet by mouth daily.   Yes [provider]  omeprazole (PRILOSEC) 20 MG capsule Take 20 mg by mouth daily.   Yes [provider]  Propylene Glycol (SYSTANE COMPLETE OP) Place 1 drop into both eyes daily.   Yes [provider]  rosuvastatin (CRESTOR) 10 MG tablet Take 10 mg by mouth daily. 05/05/21  Yes [provider]  sildenafil (REVATIO) 20 MG tablet Take 10 mg by mouth daily as needed (erectile dysfunction).   Yes [provider]  sodium chloride 1 g tablet Take 2 g by mouth 2 (two) times daily with a meal.   Yes [provider]  telmisartan (  MICARDIS) 80 MG tablet Take 80 mg by mouth daily. 03/01/21  Yes [provider]     Critical care time: 35 minutes

## 2021-05-24 NOTE — Consult Note (Addendum)
Palliative Medicine Inpatient Consult Note  Consulting Provider: Eliezer Bottom, MD  Reason for consult:   Palliative Care Consult Services Palliative Medicine Consult  Reason for Consult? Goals of care   HPI:  Per intake H&P --> Brent Yu is a 87 y.o. M who began having jerky movements ~11/26 while walking and talking. Originally he and his wife had agreed to seek medical attention for these symptoms 11/29 however he fell out of bed and struck his head after waking early 11/29. He was brought to BIB EMS who reported an initial BP of 50/30, hypoxia in the 70's on room air, AMS, and evidence of head trauma. He received epi en route and given 400 cc NS bolus to [facility] ED as a trauma patient. He was assessed and cleared from a trauma standpoint however he was found to have leukocytosis, bilateral airspace disease on CT, and hypotension.  He was intubated and central line was placed.   Laquan has remained intubated and sedated over the past eight days. His clinical course has been fluctuant though overall he is not making great improvement. The Palliative care team was asked to further discuss goals of care with the patients family.   Clinical Assessment/Goals of Care:  *Please note that this is a verbal dictation therefore any spelling or grammatical errors are due to the "Dragon Medical One" system interpretation.  I have reviewed medical records including EPIC notes, labs and imaging, received report from bedside RN, assessed the patient who was lying in bed sedated.    I called patients spouse, Brent Yu to further discuss diagnosis prognosis, GOC, EOL wishes, disposition and options.   I introduced Palliative Medicine as specialized medical care for people living with serious illness. It focuses on providing relief from the symptoms and stress of a serious illness. The goal is to improve quality of life for both the patient and the family. Brent expressed that she thought Palliative care is  Hospice care --> clarity of these misconceptions was provided.  Brent shares that she has been married to "Brent Yu" for the past sixty-two years. They share two sons and a daughter. 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 He is noted to have been a very active gentleman utilizing daily exercise and healthy nutritional intake to remain healthy. He is active in his Brent Yu and is of the [employer].  Prior to admission, Brent Yu lived at [facility] in the Independent Living section. He was able to perform all bADL's and iADL's on his own. He is identified to be the person who helped everyone else out given his friendly and gentle demeanor.  Brent reviews with me in detail the reason for admission which she just felt was initially "jerking movements" then when he fell and was admitted identified that he has a severe PNA. We discussed the significance of pneumonia in the community for patients like Brent Yu. We reviewed that he is experiencing multi-system organ dysfunction as a result of his sepsis from PNA. I shared that unfortunately this is very worrisome in the long run.  Reviewed patients impairment to his pulmonary, cardiac, and renal organ systems from sepsis. Discussed that when older this can be far more difficult to recuperate from. Discussed Brent Yu's profound volume overload and the inability to diurese him sufficiently. Discussed the role of low albumin (1.6) and how this plays into his whole clinical picture and is an additional predictor for overall mortality. Reviewed that patients ventilatory needs have increased.   A detailed discussion was  had today regarding advanced directives, Brent shares that she has these and will "look for them".  Concepts specific to code status, artifical feeding and hydration, continued IV antibiotics and rehospitalization was had. Right now, Brent would like to continue with the present course of care. I reviewed the trauma that cardiopulmonary resuscitation may  cause to Brent Yu and how it would be unlikely to improve his clinical picture. She shares understanding but also expresses feeling overwhelmed.  Brent goes on to share that she knew Brent Yu's condition was bad but did not realize he was in such critical condition. She expressed that despite this she has been able to care for herself well and would like to continue doing so.  I asked if we may set up a meeting jointly with Brent Yu's three children for additional conversations. Right now Brent expresses wanting to focus on what nephrology has to say prior to setting up any meeting. She shares that she see's Dr. Hyman Hopes and would prefer that he be the nephrologist to see Brent Yu. We reviewed that consults are seen by the provider assigned to the hospital though I would of course inform the primary medical team of this desire.  Brent does share that she would like to bring her pastor with her to the hospital for moral support. She expresses multiple times in conversation that she "has some decisions to make". I reassured Brent that the purpose of [place] talking is to have a conversation in the hopes of better understanding what Brent Yu would want under the given circumstances.   Brent continues to share that "it's critical we get the fluid off". I shared again the reasons which make this more complicated in Brent Yu's case.   I provided a great deal of emotional support through therapeutic listening.   Discussed the importance of continued conversation with family and their  medical providers regarding overall plan of care and treatment options, ensuring decisions are within the context of the patients values and GOCs.  Decision Maker: Brent Yu (spouse): [phone]  SUMMARY OF RECOMMENDATIONS   Full Code / Full Scope of Care --> Shared with patients spouse to likely poor outcome with additional resuscitative interventions  Patients spouse will bring in a copy of Living Will  Patients spouse most interested in  hearing from Nephrology at this time  Patients spouse interested in an Endocrinology consult - prefers Dr. Evlyn Kanner  Provided patients spouse insight as to patients clinical state and guarded prognosis  Spiritual support is provided through patients personal Renato Gails  Ongoing support, I plan to meet with patients spouse Friday after additional clinical information is ascertained  Code Status/Advance Care Planning: FULL CODE   Palliative Prophylaxis:  Oral Care, Turn Q2H, Pain & Constipation management, Delirium precuations  Additional Recommendations (Limitations, Scope, Preferences): Continue full scope of care   Psycho-social/Spiritual:  Desire for further Chaplaincy support: Yes Additional Recommendations: Education on sepsis from PNA and MOSD   Prognosis: Worrisome given kidney, cardiac, pulmonary dysfunction and prolonged hospitalization.  Discharge Planning: Discharge plan is uncertain.    Vitals:   05/24/21 1030 05/24/21 1118  BP: (!) 132/51   Pulse: 79   Resp: (!) 22   Temp:  100.2 F (37.9 C)  SpO2: 93%     Intake/Output Summary (Last 24 hours) at 05/24/2021 1120 Last data filed at 05/24/2021 0950 Gross per 24 hour  Intake 2574.02 ml  Output 725 ml  Net 1849.02 ml   Last Weight  Most recent update: 05/24/2021  3:42 AM  Weight  84.1 kg (185 lb 6.5 oz)            Gen:  Critically ill Caucasian elderly M HEENT: Intubated, (+) OGT CV: Regular rate and rhythm PULM: Mechanical ventilator ABD: Distended EXT: Generalized edema Neuro: On fentanyl, somnolence  PPS: 10%   This conversation/these recommendations were discussed with patient primary care team, Dr. Isaiah Serge  Time In: 1110 Time Out: 1246 Total Time: 96 minutes Greater than 50%  of this time was spent counseling and coordinating care related to the above assessment and plan.  Brent Yu Palliative Medicine Team Team Cell Phone: [phone] Please utilize secure chat with  additional questions, if there is no response within 30 minutes please call the above phone number  Palliative Medicine Team providers are available by phone from 7am to 7pm daily and can be reached through the team cell phone.  Should this patient require assistance outside of these hours, please call the patient's attending physician.

## 2021-05-24 NOTE — Progress Notes (Signed)
EKG CRITICAL VALUE     12 lead EKG performed.  Critical value noted. Nanine Means, RN notified.   Edmonia Caprio, CCT 05/24/2021 8:58 AM

## 2021-05-25 DIAGNOSIS — J9601 Acute respiratory failure with hypoxia: Secondary | ICD-10-CM | POA: Diagnosis not present

## 2021-05-25 DIAGNOSIS — J9602 Acute respiratory failure with hypercapnia: Secondary | ICD-10-CM | POA: Diagnosis not present

## 2021-05-25 LAB — RENAL FUNCTION PANEL
Albumin: <1.5 g/dL — ABNORMAL LOW (ref 3.5–5.0)
Albumin: <1.5 g/dL — ABNORMAL LOW (ref 3.5–5.0)
Anion gap: 12 (ref 5–15)
Anion gap: 14 (ref 5–15)
BUN: 86 mg/dL — ABNORMAL HIGH (ref 8–23)
BUN: 68 mg/dL — ABNORMAL HIGH (ref 8–23)
CO2: 22 mmol/L (ref 22–32)
CO2: 26 mmol/L (ref 22–32)
Calcium: 7.7 mg/dL — ABNORMAL LOW (ref 8.9–10.3)
Calcium: 8 mg/dL — ABNORMAL LOW (ref 8.9–10.3)
Chloride: 102 mmol/L (ref 98–111)
Chloride: 101 mmol/L (ref 98–111)
Creatinine, Ser: 3.76 mg/dL — ABNORMAL HIGH (ref 0.61–1.24)
Creatinine, Ser: 3.1 mg/dL — ABNORMAL HIGH (ref 0.61–1.24)
GFR, Estimated: 15 mL/min — ABNORMAL LOW (ref >60)
GFR, Estimated: 19 mL/min — ABNORMAL LOW (ref >60)
Glucose, Bld: 224 mg/dL — ABNORMAL HIGH (ref 70–99)
Glucose, Bld: 127 mg/dL — ABNORMAL HIGH (ref 70–99)
Phosphorus: 5.4 mg/dL — ABNORMAL HIGH (ref 2.5–4.6)
Phosphorus: 6.8 mg/dL — ABNORMAL HIGH (ref 2.5–4.6)
Potassium: 5.7 mmol/L — ABNORMAL HIGH (ref 3.5–5.1)
Potassium: 5.5 mmol/L — ABNORMAL HIGH (ref 3.5–5.1)
Sodium: 136 mmol/L (ref 135–145)
Sodium: 141 mmol/L (ref 135–145)

## 2021-05-25 LAB — BASIC METABOLIC PANEL
Anion gap: 12 (ref 5–15)
BUN: 85 mg/dL — ABNORMAL HIGH (ref 8–23)
CO2: 22 mmol/L (ref 22–32)
Calcium: 7.7 mg/dL — ABNORMAL LOW (ref 8.9–10.3)
Chloride: 102 mmol/L (ref 98–111)
Creatinine, Ser: 3.68 mg/dL — ABNORMAL HIGH (ref 0.61–1.24)
GFR, Estimated: 15 mL/min — ABNORMAL LOW (ref >60)
Glucose, Bld: 209 mg/dL — ABNORMAL HIGH (ref 70–99)
Potassium: 5.6 mmol/L — ABNORMAL HIGH (ref 3.5–5.1)
Sodium: 136 mmol/L (ref 135–145)

## 2021-05-25 LAB — CBC
HCT: 29.1 % — ABNORMAL LOW (ref 39.0–52.0)
Hemoglobin: 9.8 g/dL — ABNORMAL LOW (ref 13.0–17.0)
MCH: 32.3 pg (ref 26.0–34.0)
MCHC: 33.7 g/dL (ref 30.0–36.0)
MCV: 96 fL (ref 80.0–100.0)
Platelets: 208 10*3/uL (ref 150–400)
RBC: 3.03 MIL/uL — ABNORMAL LOW (ref 4.22–5.81)
RDW: 16 % — ABNORMAL HIGH (ref 11.5–15.5)
WBC: 31 10*3/uL — ABNORMAL HIGH (ref 4.0–10.5)
nRBC: 0 % (ref 0.0–0.2)

## 2021-05-25 LAB — TRIGLYCERIDES: Triglycerides: 402 mg/dL — ABNORMAL HIGH (ref <150)

## 2021-05-25 LAB — GLUCOSE, CAPILLARY
Glucose-Capillary: 118 mg/dL — ABNORMAL HIGH (ref 70–99)
Glucose-Capillary: 121 mg/dL — ABNORMAL HIGH (ref 70–99)
Glucose-Capillary: 124 mg/dL — ABNORMAL HIGH (ref 70–99)
Glucose-Capillary: 137 mg/dL — ABNORMAL HIGH (ref 70–99)
Glucose-Capillary: 137 mg/dL — ABNORMAL HIGH (ref 70–99)
Glucose-Capillary: 162 mg/dL — ABNORMAL HIGH (ref 70–99)

## 2021-05-25 LAB — PHOSPHORUS: Phosphorus: 5.4 mg/dL — ABNORMAL HIGH (ref 2.5–4.6)

## 2021-05-25 LAB — MRSA NEXT GEN BY PCR, NASAL: MRSA by PCR Next Gen: NOT DETECTED

## 2021-05-25 LAB — MAGNESIUM: Magnesium: 2.2 mg/dL (ref 1.7–2.4)

## 2021-05-25 MED ORDER — DEXMEDETOMIDINE HCL IN NACL 400 MCG/100ML IV SOLN: 0.4000 ug/kg/h | INTRAVENOUS | Status: DC

## 2021-05-25 MED ORDER — CEFEPIME HCL 2 G IJ SOLR
2.0000 g | Freq: Two times a day (BID) | INTRAMUSCULAR | Status: DC
  Administered 2021-05-25 – 2021-05-26 (×4): 2 g via INTRAVENOUS
  Filled 2021-05-25 (×4): qty 2

## 2021-05-25 MED ORDER — VANCOMYCIN HCL 1500 MG/300ML IV SOLN
1500.0000 mg | Freq: Once | INTRAVENOUS | Status: AC
  Administered 2021-05-25: 1500 mg via INTRAVENOUS
  Filled 2021-05-25: qty 300

## 2021-05-25 MED ORDER — VANCOMYCIN HCL 1000 MG/200ML IV SOLN
1000.0000 mg | INTRAVENOUS | Status: DC
  Administered 2021-05-26: 1000 mg via INTRAVENOUS
  Filled 2021-05-25: qty 200

## 2021-05-25 MED ORDER — VITAL 1.5 CAL PO LIQD
1000.0000 mL | ORAL | Status: DC
  Administered 2021-05-25: 1000 mL

## 2021-05-25 MED ORDER — PROSOURCE TF PO LIQD
90.0000 mL | Freq: Three times a day (TID) | ORAL | Status: DC
  Administered 2021-05-25 – 2021-05-27 (×7): 90 mL
  Filled 2021-05-25 (×8): qty 90

## 2021-05-25 NOTE — Progress Notes (Addendum)
[facility] Progress Note    Assessment/ Plan:   AKI likely 2/2 ATN/septic shock- CRRT started 12/7 for for diminishing urine output, rising K & BUN. Appreciate CCM's assistance with RIJ temp line placement on 12/7. Will c/w CRRT, will increase UF goals to aim for net neg 200cc/hr. Discussed with primary service and ICU RN. Would recommend a limited time trial for CRRT given overall clinical picture. Tried to approach this with wife yesterday but she wanted to see how he would do on CRRT first, appreciate palliative care's assistance Acute hypoxic respiratory failure 2/2 MSSA/E coli/H Influenza PNA- Azithromycin and Cefepime treatments completed. Vent support per PCCM Septic Shock 2/2 PNA-  on NE Acute encephalopathy- 11/29 EEG revealed diffuse encephalopathy, negative for seizures.  Hypertension/volume  - Uf'ing as tolerated, increasing goals to meet net neg 200cc/hr Anemia of Chronic Disease - Hgb 9.8. Transfuse if Hgb <7. Avoid iron given sepsis Hyperphosphatemia  - phos 5.4 today, should improve further with CRRT Nutrition - 12/4 albumin 1.6. Continue feeding supplement. Would recommend giving nepro.Trend albumin in AM.  Hyperglycemia- Obtain A1C. Continue SSI. Marland Kitchen  History of SIADH- Na WNL here, hold salt tabs  Subjective:   No acute events. Was able to pull fluid on crrt late last night, actually tolerating up to net neg 150cc/hr.    Objective:   BP (!) 118/47   Pulse (!) 58   Temp 97.6 F (36.4 C) (Oral)   Resp 20   Ht 5\' 9"  (1.753 m)   Wt 82.2 kg   SpO2 96%   BMI 26.76 kg/m   Intake/Output Summary (Last 24 hours) at 05/25/2021 14/01/2021 Last data filed at 05/25/2021 0700 Gross per 24 hour  Intake 2542.9 ml  Output 2406 ml  Net 136.9 ml   Weight change: -1.9 kg  Physical Exam: 14/01/2021, intubated CVS:s1s2, rrr Resp:coarse breath sounds diffusely, intubated, b/l chest expansion LNV:KSYNQXY, nt/nd PST:[facility] Neuro: sedated Dialysis access: RIJ temp line  (placed 12/7)  Imaging: 14/7 RENAL  Result Date: 05/24/2021 CLINICAL DATA:  Acute kidney injury. EXAM: RENAL / URINARY TRACT ULTRASOUND COMPLETE COMPARISON:  CT chest abdomen and pelvis 04/23/2021. FINDINGS: Right Kidney: Renal measurements: 10.3 x 5.4 x 3.9 cm = volume: 113 mL. Echogenicity within normal limits. No mass or hydronephrosis visualized. Left Kidney: Renal measurements: 10.8 by 5.2 x 4.7 cm = volume: 138 mL. Echogenicity within normal limits. No mass or hydronephrosis visualized. Bladder: Not seen. Other: Sonographer notes left upper quadrant structure containing mobile debris, possibly dilated stomach. IMPRESSION: 1. Kidneys appear within normal limits. 2. Bladder not well seen. 3. Indeterminate left upper quadrant structure containing mobile debris, possibly dilated stomach. Please correlate clinically. Electronically Signed   By: 05/18/2021 M.D.   On: 05/24/2021 20:41   DG CHEST PORT 1 VIEW  Result Date: 05/24/2021 CLINICAL DATA:  Central line placement EXAM: PORTABLE CHEST 1 VIEW COMPARISON:  05/23/2021 FINDINGS: Right IJ temporary dialysis catheter tip proximal to mid SVC level. Endotracheal tube in the mid trachea well above the carina. NG tube enters the stomach with the tip not visualized. Right PICC line tip SVC RA junction. Right hemidiaphragm remains elevated. Bilateral patchy airspace opacities, most pronounced in the right upper lobe and left lower lung. Overall, little interval change in aeration. No large effusion or pneumothorax. IMPRESSION: Right IJ temporary dialysis catheter tip proximal to mid SVC level. Other support apparatus stable. Similar bilateral airspace opacities. Electronically Signed   By: 14/11/2020.  Shick M.D.   On: 05/24/2021 15:52  Labs: BMET Recent Labs  Lab 05/20/21 0455 05/20/21 1546 05/21/21 0244 05/22/21 0245 05/23/21 0304 05/24/21 0329 05/24/21 0956 05/24/21 1555 05/25/21 0418  NA 141   < > 141 143 146* 144 146* 143 136  136  K 3.8   < > 4.0  4.0 4.4 5.1 5.4* 5.6* 5.6*  5.7*  CL 114*  --  112* 111 112* 110  --  107 102  102  CO2 19*  --  20* $Re'24 22 22  'aSn$ --  21* 22  22  GLUCOSE 225*  --  214* 171* 167* 132*  --  116* 209*  224*  BUN 78*  --  76* 84* 90* 111*  --  114* 85*  86*  CREATININE 2.35*  --  2.39* 2.85* 3.14* 4.03*  --  4.60* 3.68*  3.76*  CALCIUM 6.4*  --  7.3* 7.6* 7.9* 7.9*  --  7.6* 7.7*  7.7*  PHOS  --   --   --   --  3.9 4.7*  --  5.2* 5.4*  5.4*   < > = values in this interval not displayed.   CBC Recent Labs  Lab 05/22/21 0245 05/23/21 0304 05/24/21 0329 05/24/21 0956 05/25/21 0418  WBC 16.2* 20.1* 18.9*  --  31.0*  HGB 9.1* 9.2* 9.1* 9.9* 9.8*  HCT 27.5* 28.4* 27.3* 29.0* 29.1*  MCV 95.2 98.3 96.1  --  96.0  PLT 110* 128* 155  --  208    Medications:     amiodarone  400 mg Per Tube BID   Followed by   Derrill Memo ON 05/27/2021] amiodarone  200 mg Per Tube Daily   chlorhexidine gluconate (MEDLINE KIT)  15 mL Mouth Rinse BID   Chlorhexidine Gluconate Cloth  6 each Topical Q0600   heparin  5,000 Units Subcutaneous Q8H   insulin aspart  0-9 Units Subcutaneous Q4H   levothyroxine  25 mcg Per Tube Q0600   mouth rinse  15 mL Mouth Rinse 10 times per day   midazolam  4 mg Intravenous Once   pantoprazole sodium  40 mg Per Tube QHS   polyethylene glycol  17 g Per Tube Daily   sodium chloride flush  10-40 mL Intracatheter Q12H   sodium chloride flush  10-40 mL Intracatheter Q12H      Gean Quint, MD [facility] 05/25/2021, 7:33 AM

## 2021-05-25 NOTE — Progress Notes (Addendum)
NAME:  Brent Yu, MRN:  [id number], DOB:  [birth year], LOS: 9 ADMISSION DATE:  04/18/2021, CONSULTATION DATE:  11/29 REFERRING MD:  Grandville Silos, CHIEF COMPLAINT:  resp failure, sepsis, AMS   History of Present Illness:  Brent Yu is a 87 y.o. M who began having jerky movements ~11/26 while walking and talking. Originally he and his wife had agreed to seek medical attention for these symptoms 11/29 however he fell out of bed and struck his head after waking early 11/29. He was brought to [facility] who reported an initial BP of 50/30, hypoxia in the 70's on room air, AMS, and evidence of head trauma. He received epi en route and given 400 cc NS bolus to [facility] ED as a trauma patient. He was assessed and cleared from a trauma standpoint however he was found to have leukocytosis, bilateral airspace disease on CT, and hypotension.  He was intubated and central line was placed. PCCM was consulted for further management  Pertinent  Medical History  Gilbert's syndrome, HTN, HLD, hypothyroidism, SIADH, and urinary incontinence   Significant Hospital Events: Including procedures, antibiotic start and stop dates in addition to other pertinent events   05/14/2021 Intubated 04/21/2021 Started on vasopressors 05/15/2021 CT of the head neck negative, CT of chest with bilateral airspace disease 05/18/2021 Added vasopressin for worsening shock 05/19/2021 Improving pressor requirement  12/5 Increasing levo and vaso requirements overnight and sedation 12/6 Worsening kidney function, consulted nephro and palliative care 12/7 Started on CRRT overnight   Interim History / Subjective:  Patient remains sedated, unable to follow commands. Started on CRRT and tolerating well to this point.  Objective   Blood pressure (!) 124/46, pulse 63, temperature 98.1 F (36.7 C), temperature source Axillary, resp. rate (!) 22, height 5\' 9"  (1.753 m), weight 82.2 kg, SpO2 95 %. CVP:  [19 mmHg] 19 mmHg  Vent Mode: PRVC FiO2  (%):  [65 %-80 %] 80 % Set Rate:  [20 bmp] 20 bmp Vt Set:  [550 mL] 550 mL PEEP:  [8 cmH20] 8 cmH20 Plateau Pressure:  [18 cmH20-33 cmH20] 18 cmH20   Intake/Output Summary (Last 24 hours) at 05/25/2021 0624 Last data filed at 05/25/2021 0600 Gross per 24 hour  Intake 2490.04 ml  Output 2128 ml  Net 362.04 ml   Filed Weights   05/23/21 0500 05/24/21 0341 05/25/21 0413  Weight: 83.7 kg 84.1 kg 82.2 kg    Examination: General: chronically ill appearing, NAD. Sedated, intubated HENT: [place], AT. On vent. R IJ HD cath Lungs: Coarse breath sounds bilaterally Cardiovascular: RRR, no murmur Abdomen: Soft, nontender, +BS Extremities: trace LE edema and dependent UE edema Neuro: Sedated and unable to follow commands GU: External foley  Resolved Hospital Problem list   Hyperkalemia Hypocalcemia Afib  Assessment & Plan:  Acute hypoxic respiratory failure 2/2 to MSSA/E coli/H. Influenza pneumonia Elevated triglycerides  Patient remains dependent on  mechanical ventilation: PRVC with RR of 20, TV 550, PEEP 8, FiO2 80%, plateau pressure of 18 (peak pressure 33). Patient is s/p course of azithromycin on 12/3 and cefepime on 12/5. Repeat tracheal aspirate with abundant staph aureus. Trigs elevated to 400 today. - Vanc per pharmacy + cefepime - Repeat MRSA PCR pending - Duonebs PRN - Wean PEEP and FiO2 as tolerated: FiO2 40% and PEEP 5 goal - Maintain SpO2 >90% - Palliative care consulted, appreciate assistance - Switch propofol to precedex in the setting of elevated trigs   Septic shock 2/2 PNA - Maintain MAP >65 -  Continue levophed and vasopressin, wean as able - s/p abx therapy as above   Acute renal insufficiency Significantly diminished urine output over the last few days- 30 cc recorded UOP over the last 24 hrs and patient is net +19.3 L this admission. Cr continued to increase over the last few days, and patient had HD cath placed and started on CRRT yesterday. K 5.7, BUN/Cr  86/3.76. Renal ultrasound noted normal appearance of kidneys.  - Nephrology consulted, appreciate recs - CRRT with net neg 150 to 200 cc/hr, increase if tolerating  - Continue to monitor renal function - Avoid nephrotoxic agents - Repeat urinalysis  Acute encephalopathy EEG 11/29 with diffuse encephalopathy, negative for seizures   Afib with RVR > in sinus rhythm now Continue amiodarone 400 mg daily x 7 days, transition to 200 mg on 12/10    Anemia of chronic disease Hb stable at 9.8 today. Transfuse if Hb <7  History of Gilbert's syndrome - Monitor LFTs and bili   Hypothyroidism - Continue home levothyroxine 25 mcg daily   SIADH Sodium 136 today. Patient takes salt tabs at home and was continued on this during hospitalization, although has been held in setting of hypernatremia.   Best Practice (right click and "Reselect all SmartList Selections" daily)   Diet/type: tubefeeds DVT prophylaxis: prophylactic heparin  GI prophylaxis: PPI Lines: R brachial PICC, R IJ HD catheter Foley:  External urinary catheter Code Status:  full code Last date of multidisciplinary goals of care discussion [Family updated 12/7]  Labs   CBC: Recent Labs  Lab 05/21/21 0244 05/22/21 0245 05/23/21 0304 05/24/21 0329 05/24/21 0956 05/25/21 0418  WBC 12.9* 16.2* 20.1* 18.9*  --  31.0*  HGB 10.1* 9.1* 9.2* 9.1* 9.9* 9.8*  HCT 30.4* 27.5* 28.4* 27.3* 29.0* 29.1*  MCV 95.6 95.2 98.3 96.1  --  96.0  PLT 98* 110* 128* 155  --  208    Basic Metabolic Panel: Recent Labs  Lab 05/19/21 0357 05/19/21 0738 05/22/21 0245 05/23/21 0304 05/24/21 0329 05/24/21 0956 05/24/21 1555 05/25/21 0418  NA  --    < > 143 146* 144 146* 143 136  136  K  --    < > 4.0 4.4 5.1 5.4* 5.6* 5.6*  5.7*  CL  --    < > 111 112* 110  --  107 102  102  CO2  --    < > 24 22 22   --  21* 22  22  GLUCOSE  --    < > 171* 167* 132*  --  116* 209*  224*  BUN  --    < > 84* 90* 111*  --  114* 85*  86*  CREATININE   --    < > 2.85* 3.14* 4.03*  --  4.60* 3.68*  3.76*  CALCIUM  --    < > 7.6* 7.9* 7.9*  --  7.6* 7.7*  7.7*  MG 2.2  --   --  2.1 2.1  --   --  2.2  PHOS  --   --   --  3.9 4.7*  --  5.2* 5.4*  5.4*   < > = values in this interval not displayed.   GFR: Estimated Creatinine Clearance: 14.4 mL/min (A) (by C-G formula based on SCr of 3.68 mg/dL (H)). Recent Labs  Lab 05/19/21 0807 05/19/21 1107 05/20/21 0455 05/22/21 0245 05/23/21 0304 05/24/21 0329 05/25/21 0418  WBC  --   --    < > 16.2*  20.1* 18.9* 31.0*  LATICACIDVEN 2.1* 1.3  --   --   --   --   --    < > = values in this interval not displayed.    Liver Function Tests: Recent Labs  Lab 05/21/21 0244 05/24/21 1555 05/25/21 0418  AST 55*  --   --   ALT 126*  --   --   ALKPHOS 128*  --   --   BILITOT 1.1  --   --   PROT 5.3*  --   --   ALBUMIN 1.6* <1.5* <1.5*   No results for input(s): LIPASE, AMYLASE in the last 168 hours.  No results for input(s): AMMONIA in the last 168 hours.  ABG    Component Value Date/Time   PHART 7.292 (L) 05/24/2021 0956   PCO2ART 47.9 05/24/2021 0956   PO2ART 56 (L) 05/24/2021 0956   HCO3 23.0 05/24/2021 0956   TCO2 24 05/24/2021 0956   ACIDBASEDEF 3.0 (H) 05/24/2021 0956   O2SAT 84.0 05/24/2021 0956     Coagulation Profile: No results for input(s): INR, PROTIME in the last 168 hours.  Cardiac Enzymes: No results for input(s): CKTOTAL, CKMB, CKMBINDEX, TROPONINI in the last 168 hours.  HbA1C: No results found for: HGBA1C  CBG: Recent Labs  Lab 05/24/21 1105 05/24/21 1555 05/24/21 1910 05/24/21 2312 05/25/21 0324  GLUCAP 139* 91 98 129* 137*    Review of Systems:     Critical care time: 30 minutes

## 2021-05-25 NOTE — Plan of Care (Signed)
PCCM Interval Progress Note  PCCM Ground Team asked by E-Link to present to bedside to speak with patient's wife, Brent Yu, regarding EOL and GOC.   On arrival to patient's bedside, Brent Yu and two pastors from her church were present with her. I introduced myself as being from the PCCM team and explained my role. I asked Brent Yu to give me an idea of her understanding of Brent Yu's current condition. She briefly reviewed the events of the last few days and described her understanding (she has a background as a Production designer, theatre/television/film and has some medical knowledge/insight). I reviewed Brent Yu's current medical issues and the concerns her nursing team expressed about his clinical trajectory; notably, this included his increasing respiratory support requirements and increasing pressor requirements, especially while on CRRT. Brent Yu seemed to understand that Brent Yu's support needs have gone up significantly. I examined Brent Yu in the room and conducted a neurologic exam, which (per RN) was different from this morning (12/8AM). Brent Yu was minimally responsive despite decreased levels of sedation.  I explained this to Brent Yu as well as my concern about this in conjunction with his escalating respiratory and pressor needs and in this context, recommended change in code status to DNR. I clearly explained that this would not mean any de-escalation of care or removal of current measures, but would spare Brent Yu likely futile versus harmful CPR in the event of a code event. Brent Yu was not ready to change Brent Yu's code status at this time and wished to keep him a full code. Her son was reportedly en route and she wished to discuss this with him. Brent Yu also stated that she would like to speak with Dr. Thedore Mins (Nephrology) and the Palliative Care team/have their family meeting before making this decision.  Advised Brent Yu that our team will remain available for any questions/concerns that may arise.  Brent Lair, PA-C [facility] 05/26/21 22:12  Please see [email or web address] for pager details.  From 7A-7P if no response, please call [phone] After hours, please call ELink [phone]

## 2021-05-25 NOTE — Progress Notes (Signed)
Nutrition Follow-up  DOCUMENTATION CODES:   Non-severe (moderate) malnutrition in context of chronic illness  INTERVENTION:   Continue tube feeding via OG tube: Change formula to Vital 1.5 at 55 ml/h (1320 ml per day) Prosource TF 90 ml TID  Provides 2220 kcal, 155 gm protein, 1008 ml free water daily.  NUTRITION DIAGNOSIS:   Moderate Malnutrition related to chronic illness (asthma) as evidenced by mild muscle depletion, moderate muscle depletion, mild fat depletion, moderate fat depletion.  Ongoing  GOAL:   Patient will meet greater than or equal to 90% of their needs  Met with TF  MONITOR:   TF tolerance, Vent status, Labs  REASON FOR ASSESSMENT:   Ventilator, Consult Enteral/tube feeding initiation and management  ASSESSMENT:   87 yo male admitted S/P fall out of bed. Admitted with sepsis and found to have extensive PNA. Required intubation on admission. PMH includes Gilbert's syndrome, asthma, SIADH, esophagitis, HLD, HTN, hypothyroidism.  Discussed patient in ICU rounds today. Tolerating TF without difficulty. CRRT initiated 12/7.  Currently receiving Vital AF 1.2 at 65 ml/h via OG tube.  Tolerating well.  Not quite meeting increased nutrition needs for CRRT. Will adjust TF regimen to provide more kcal and protein.  Patient remains intubated on ventilator support MV: 14.7 L/min Temp (24hrs), Avg:98.7 F (37.1 C), Min:97 F (36.1 C), Max:101 F (38.3 C)  Propofol weaning to off d/t elevated TG.  Labs reviewed. K 5.6, Phos 5.4, TG 402 CBG: 137-162  Medications reviewed and include Novolog, Protonix, Miralax, Precedex, Levophed, vasopressin.  I/O +18 L since admission Admission weight 71.7 kg Current weight 82.2 kg  Diet Order:   Diet Order             Diet NPO time specified  Diet effective now                   EDUCATION NEEDS:   No education needs have been identified at this time  Skin:  Skin Assessment: Reviewed RN  Assessment  Last BM:  12/5  Height:   Ht Readings from Last 1 Encounters:  05/22/21 5' 9" (1.753 m)    Weight:   Wt Readings from Last 1 Encounters:  05/25/21 82.2 kg    BMI:  Body mass index is 26.76 kg/m.  Estimated Nutritional Needs:   Kcal:  2100-2300  Protein:  145-180 gm  Fluid:  >/= 2 L     H, RD, LDN, CNSC Please refer to Amion for contact information.                                                        

## 2021-05-25 NOTE — Progress Notes (Signed)
Pharmacy Antibiotic Note  Brent Yu is a 87 y.o. male admitted on 04/19/2021 with pneumonia.  Pharmacy has been consulted for Vancomycin dosing.  Trach aspirate with many staph aureus - susceptibilties to follow Patient on CRRT  Plan: Vanc 1500 mg IV x1, then 1000 mg IV q24 hours while on CRRT Monitor susceptibilities and MRSA PCR, monitor for ongoing dialysis, monitor vanc levels as needed  Height: 5\' 9"  (175.3 cm) Weight: 82.2 kg (181 lb 3.5 oz) IBW/kg (Calculated) : 70.7  Temp (24hrs), Avg:99.1 F (37.3 C), Min:97.6 F (36.4 C), Max:101 F (38.3 C)  Recent Labs  Lab 05/19/21 0807 05/19/21 0830 05/19/21 1107 05/20/21 0455 05/21/21 0244 05/22/21 0245 05/23/21 0304 05/24/21 0329 05/24/21 1555 05/25/21 0418  WBC  --   --   --    < > 12.9* 16.2* 20.1* 18.9*  --  31.0*  CREATININE  --   --   --    < > 2.39* 2.85* 3.14* 4.03* 4.60* 3.68*  3.76*  LATICACIDVEN 2.1*  --  1.3  --   --   --   --   --   --   --   VANCORANDOM  --  16  --   --   --   --   --   --   --   --    < > = values in this interval not displayed.    Estimated Creatinine Clearance: 14.4 mL/min (A) (by C-G formula based on SCr of 3.68 mg/dL (H)).    Allergies  Allergen Reactions   Acetaminophen     Can't take due to liver function.   Atorvastatin     Elevated liver function active   Hydrochlorothiazide     Hyponatremia active   Lisinopril Cough    Antimicrobials this admission: Azithro 11/29 >> 12/3 Cefepime 11/29 >> 12/5 Vanco 12/1 >>12/3; 12/8>>   Microbiology results: 11/29 Bcx NGTD 11/29 MRSA PCR Not detected 11/29 resp: Staph aureus, H influ (beta lactamase+) and e.coli 11/29 Ucx NGTD 12/6 resp - staph aureus  Thank you for allowing pharmacy to be a part of this patient's care.  12/29, PharmD, [facility] Clinical Pharmacist Please see AMION for all Pharmacists' Contact Phone Numbers 05/25/2021, 8:35 AM

## 2021-05-25 NOTE — Progress Notes (Signed)
eLink Physician-Brief Progress Note Patient Name: JADRIAN BULMAN DOB: [birth year] MRN: [id number]   Date of Service  05/25/2021  HPI/Events of Note  - pt less responsive; essentially unresponsive to any stim at this point - SpO2 is dropping - pt is 'guppy breathing' - evaluated on camera  eICU Interventions  - asked RN to inc PEEP from 10 to 12 - pt's wife is enroute to discuss end of life care with bed side RN     Intervention Category Intermediate Interventions: Respiratory distress - evaluation and management  Jacinta Shoe 05/25/2021, 7:49 PM

## 2021-05-25 NOTE — Progress Notes (Addendum)
A lot of family at bedside. No mechanical ventilatory assessment done at this time.

## 2021-05-25 NOTE — Progress Notes (Incomplete)
Attending note: I have seen and examined the patient. History, labs and imaging reviewed.   Started on CRRT with negative 150/hr. BP is stable Remains on vent. Febrile overnight   Blood pressure (!) 142/57, pulse 73, temperature 97.6 F (36.4 C), temperature source Oral, resp. rate (!) 25, height 5\' 9"  (1.753 m), weight 82.2 kg, SpO2 94 %. Gen:      No acute distress HEENT:  EOMI, sclera anicteric Neck:     No masses; no thyromegaly Lungs:    Clear to auscultation bilaterally; normal respiratory effort*** CV:         Regular rate and rhythm; no murmurs Abd:      + bowel sounds; soft, non-tender; no palpable masses, no distension Ext:    No edema; adequate peripheral perfusion Skin:      Warm and dry; no rash Neuro: alert and oriented x 3 Psych: normal mood and affect   Labs/Imaging personally reviewed, significant for K 5.7, Cr 3.76 WBC 31 No new imaging   Assessment/plan: Acute resp failure with prior MSSA, Ecoli and H flu Wean off propofol. Try precedex and propofol Restart antibiotics vanco and cefepime as he has staph aureus in sputum    The patient is critically ill with multiple organ systems failure and requires high complexity decision making for assessment and support, frequent evaluation and titration of therapies, application of advanced monitoring technologies and extensive interpretation of multiple databases.  Critical care time - 35 mins. This represents my time independent of the NPs time taking care of the pt.  MD [facility] 05/25/2021, 10:06 AM

## 2021-05-26 DIAGNOSIS — Z66 Do not resuscitate: Secondary | ICD-10-CM

## 2021-05-26 DIAGNOSIS — J9602 Acute respiratory failure with hypercapnia: Secondary | ICD-10-CM | POA: Diagnosis not present

## 2021-05-26 DIAGNOSIS — J9601 Acute respiratory failure with hypoxia: Secondary | ICD-10-CM | POA: Diagnosis not present

## 2021-05-26 DIAGNOSIS — N179 Acute kidney failure, unspecified: Secondary | ICD-10-CM | POA: Diagnosis not present

## 2021-05-26 LAB — BASIC METABOLIC PANEL
Anion gap: 19 — ABNORMAL HIGH (ref 5–15)
BUN: 57 mg/dL — ABNORMAL HIGH (ref 8–23)
CO2: 24 mmol/L (ref 22–32)
Calcium: 7.5 mg/dL — ABNORMAL LOW (ref 8.9–10.3)
Chloride: 94 mmol/L — ABNORMAL LOW (ref 98–111)
Creatinine, Ser: 2.55 mg/dL — ABNORMAL HIGH (ref 0.61–1.24)
GFR, Estimated: 24 mL/min — ABNORMAL LOW (ref >60)
Glucose, Bld: 138 mg/dL — ABNORMAL HIGH (ref 70–99)
Potassium: 5.6 mmol/L — ABNORMAL HIGH (ref 3.5–5.1)
Sodium: 137 mmol/L (ref 135–145)

## 2021-05-26 LAB — GLUCOSE, CAPILLARY
Glucose-Capillary: 119 mg/dL — ABNORMAL HIGH (ref 70–99)
Glucose-Capillary: 119 mg/dL — ABNORMAL HIGH (ref 70–99)
Glucose-Capillary: 124 mg/dL — ABNORMAL HIGH (ref 70–99)
Glucose-Capillary: 124 mg/dL — ABNORMAL HIGH (ref 70–99)
Glucose-Capillary: 128 mg/dL — ABNORMAL HIGH (ref 70–99)
Glucose-Capillary: 141 mg/dL — ABNORMAL HIGH (ref 70–99)

## 2021-05-26 LAB — POCT I-STAT 7, (LYTES, BLD GAS, ICA,H+H)
Acid-base deficit: 1 mmol/L (ref 0.0–2.0)
Acid-base deficit: 1 mmol/L (ref 0.0–2.0)
Acid-base deficit: 2 mmol/L (ref 0.0–2.0)
Bicarbonate: 26.3 mmol/L (ref 20.0–28.0)
Bicarbonate: 26.9 mmol/L (ref 20.0–28.0)
Bicarbonate: 25.3 mmol/L (ref 20.0–28.0)
Calcium, Ion: 1.06 mmol/L — ABNORMAL LOW (ref 1.15–1.40)
Calcium, Ion: 1.04 mmol/L — ABNORMAL LOW (ref 1.15–1.40)
Calcium, Ion: 1.04 mmol/L — ABNORMAL LOW (ref 1.15–1.40)
HCT: 33 % — ABNORMAL LOW (ref 39.0–52.0)
HCT: 32 % — ABNORMAL LOW (ref 39.0–52.0)
HCT: 32 % — ABNORMAL LOW (ref 39.0–52.0)
Hemoglobin: 11.2 g/dL — ABNORMAL LOW (ref 13.0–17.0)
Hemoglobin: 10.9 g/dL — ABNORMAL LOW (ref 13.0–17.0)
Hemoglobin: 10.9 g/dL — ABNORMAL LOW (ref 13.0–17.0)
O2 Saturation: 92 %
O2 Saturation: 95 %
O2 Saturation: 96 %
Patient temperature: 98.7
Patient temperature: 99.2
Patient temperature: 98.9
Potassium: 5.5 mmol/L — ABNORMAL HIGH (ref 3.5–5.1)
Potassium: 5.4 mmol/L — ABNORMAL HIGH (ref 3.5–5.1)
Potassium: 5.4 mmol/L — ABNORMAL HIGH (ref 3.5–5.1)
Sodium: 135 mmol/L (ref 135–145)
Sodium: 135 mmol/L (ref 135–145)
Sodium: 136 mmol/L (ref 135–145)
TCO2: 28 mmol/L (ref 22–32)
TCO2: 29 mmol/L (ref 22–32)
TCO2: 27 mmol/L (ref 22–32)
pCO2 arterial: 58.5 mmHg — ABNORMAL HIGH (ref 32.0–48.0)
pCO2 arterial: 58.5 mmHg — ABNORMAL HIGH (ref 32.0–48.0)
pCO2 arterial: 56.6 mmHg — ABNORMAL HIGH (ref 32.0–48.0)
pH, Arterial: 7.262 — ABNORMAL LOW (ref 7.350–7.450)
pH, Arterial: 7.273 — ABNORMAL LOW (ref 7.350–7.450)
pH, Arterial: 7.259 — ABNORMAL LOW (ref 7.350–7.450)
pO2, Arterial: 75 mmHg — ABNORMAL LOW (ref 83.0–108.0)
pO2, Arterial: 90 mmHg (ref 83.0–108.0)
pO2, Arterial: 94 mmHg (ref 83.0–108.0)

## 2021-05-26 LAB — PHOSPHORUS: Phosphorus: 6.4 mg/dL — ABNORMAL HIGH (ref 2.5–4.6)

## 2021-05-26 LAB — CULTURE, RESPIRATORY W GRAM STAIN

## 2021-05-26 LAB — RENAL FUNCTION PANEL
Albumin: <1.5 g/dL — ABNORMAL LOW (ref 3.5–5.0)
Albumin: <1.5 g/dL — ABNORMAL LOW (ref 3.5–5.0)
Anion gap: 19 — ABNORMAL HIGH (ref 5–15)
Anion gap: 14 (ref 5–15)
BUN: 57 mg/dL — ABNORMAL HIGH (ref 8–23)
BUN: 65 mg/dL — ABNORMAL HIGH (ref 8–23)
CO2: 24 mmol/L (ref 22–32)
CO2: 21 mmol/L — ABNORMAL LOW (ref 22–32)
Calcium: 7.6 mg/dL — ABNORMAL LOW (ref 8.9–10.3)
Calcium: 7.5 mg/dL — ABNORMAL LOW (ref 8.9–10.3)
Chloride: 92 mmol/L — ABNORMAL LOW (ref 98–111)
Chloride: 100 mmol/L (ref 98–111)
Creatinine, Ser: 2.58 mg/dL — ABNORMAL HIGH (ref 0.61–1.24)
Creatinine, Ser: 2.66 mg/dL — ABNORMAL HIGH (ref 0.61–1.24)
GFR, Estimated: 24 mL/min — ABNORMAL LOW (ref >60)
GFR, Estimated: 23 mL/min — ABNORMAL LOW (ref >60)
Glucose, Bld: 138 mg/dL — ABNORMAL HIGH (ref 70–99)
Glucose, Bld: 131 mg/dL — ABNORMAL HIGH (ref 70–99)
Phosphorus: 6.5 mg/dL — ABNORMAL HIGH (ref 2.5–4.6)
Phosphorus: 8.6 mg/dL — ABNORMAL HIGH (ref 2.5–4.6)
Potassium: 5.5 mmol/L — ABNORMAL HIGH (ref 3.5–5.1)
Potassium: 5.9 mmol/L — ABNORMAL HIGH (ref 3.5–5.1)
Sodium: 135 mmol/L (ref 135–145)
Sodium: 135 mmol/L (ref 135–145)

## 2021-05-26 LAB — CBC
HCT: 31.4 % — ABNORMAL LOW (ref 39.0–52.0)
Hemoglobin: 10.4 g/dL — ABNORMAL LOW (ref 13.0–17.0)
MCH: 32 pg (ref 26.0–34.0)
MCHC: 33.1 g/dL (ref 30.0–36.0)
MCV: 96.6 fL (ref 80.0–100.0)
Platelets: 232 10*3/uL (ref 150–400)
RBC: 3.25 MIL/uL — ABNORMAL LOW (ref 4.22–5.81)
RDW: 16.2 % — ABNORMAL HIGH (ref 11.5–15.5)
WBC: 33.1 10*3/uL — ABNORMAL HIGH (ref 4.0–10.5)
nRBC: 0.1 % (ref 0.0–0.2)

## 2021-05-26 LAB — MAGNESIUM: Magnesium: 2.9 mg/dL — ABNORMAL HIGH (ref 1.7–2.4)

## 2021-05-26 LAB — LACTIC ACID, PLASMA: Lactic Acid, Venous: 1.6 mmol/L (ref 0.5–1.9)

## 2021-05-26 MED ORDER — PRISMASOL BGK 0/2.5 32-2.5 MEQ/L REPLACEMENT SOLN
Status: DC
  Filled 2021-05-26 (×6): qty 5000

## 2021-05-26 MED ORDER — PRISMASOL BGK 0/2.5 32-2.5 MEQ/L REPLACEMENT SOLN
Status: DC
  Filled 2021-05-26 (×7): qty 5000

## 2021-05-26 MED ORDER — HYDROCORTISONE SOD SUC (PF) 100 MG IJ SOLR
100.0000 mg | Freq: Two times a day (BID) | INTRAMUSCULAR | Status: DC
  Administered 2021-05-26 – 2021-05-27 (×4): 100 mg via INTRAVENOUS
  Filled 2021-05-26 (×4): qty 2

## 2021-05-26 NOTE — Progress Notes (Addendum)
[facility] NEPHROLOGY PROGRESS NOTE  Assessment/ Plan: Pt is a 87 y.o. yo male with history of hypertension, HLD, hypothyroidism, SIADH, Gilbert's syndrome, urinary incontinence admitted with fall, altered mental status due to septic shock, pneumonia and now AKI and respiratory failure.  #Acute kidney injury likely ATN due to septic shock: CRRT started on 12/7 for hyperkalemia, azotemia and decreasing urine output.  Currently net UF goal around 200 cc/hour.  Given hypotension and increased requirement of pressors, I will lower UF to 50 cc an hour.  Potassium level 5.6 on all 2K bath therefore changing pre- and post filter fluid to 0K and continue PrismaSol/dialysis 2K bath.  Monitor lab. Patient is not responsive and becoming more hypotensive required escalation of Levophed and vasopressin.  No sign of renal or clinical recovery.  Overall prognosis looks poor.  Palliative care team is already following and noted plan for family meeting today to discuss further goals of care.  I will be available if needed.  Discussed with ICU team.  #Septic shock due to pneumonia: Currently on Levophed and vasopressin.  Monitor BP.  #Ventilator dependent respiratory failure/influenza pneumonia/MSSA: Currently on vancomycin and cefepime.  #Hyperkalemia: Adjusting CRRT fluid.  Monitor lab.  #Hyperphosphatemia: Expect to improve with CRRT.  #SIADH/hyponatremia: Managing with CRRT, sodium 137.  #Anemia: Hemoglobin 10.4.  Discussed with ICU nurse and Dr. Tamala Julian.  Subjective: Seen and examined in ICU.  Noted pressors requirement went up for hypotension.  No urine output.  Tolerating CRRT ok so far, no issue with the filter. Objective Vital signs in last 24 hours: Vitals:   05/26/21 0615 05/26/21 0630 05/26/21 0645 05/26/21 0700  BP: (!) 121/49 (!) 130/54 (!) 118/54 (!) 130/54  Pulse: 76 78 83 82  Resp: (!) 30 (!) 30 (!) 33 (!) 31  Temp:      TempSrc:      SpO2: 98% 97% 97% 98%  Weight:       Height:       Weight change: -6 kg  Intake/Output Summary (Last 24 hours) at 05/26/2021 0721 Last data filed at 05/26/2021 0700 Gross per 24 hour  Intake 2546.3 ml  Output 6994 ml  Net -4447.7 ml       Labs: Basic Metabolic Panel: Recent Labs  Lab 05/25/21 0418 05/25/21 1558 05/26/21 0539  NA 136  136 141 137  135  K 5.6*  5.7* 5.5* 5.6*  5.5*  CL 102  102 101 94*  92*  CO2 $Re'22  22 26 24  24  'ooL$ GLUCOSE 209*  224* 127* 138*  138*  BUN 85*  86* 68* 57*  57*  CREATININE 3.68*  3.76* 3.10* 2.55*  2.58*  CALCIUM 7.7*  7.7* 8.0* 7.5*  7.6*  PHOS 5.4*  5.4* 6.8* 6.4*  6.5*   Liver Function Tests: Recent Labs  Lab 05/21/21 0244 05/24/21 1555 05/25/21 0418 05/25/21 1558 05/26/21 0539  AST 55*  --   --   --   --   ALT 126*  --   --   --   --   ALKPHOS 128*  --   --   --   --   BILITOT 1.1  --   --   --   --   PROT 5.3*  --   --   --   --   ALBUMIN 1.6*   < > <1.5* <1.5* <1.5*   < > = values in this interval not displayed.   No results for input(s): LIPASE, AMYLASE in  the last 168 hours. No results for input(s): AMMONIA in the last 168 hours. CBC: Recent Labs  Lab 05/22/21 0245 05/23/21 0304 05/24/21 0329 05/24/21 0956 05/25/21 0418 05/26/21 0539  WBC 16.2* 20.1* 18.9*  --  31.0* 33.1*  HGB 9.1* 9.2* 9.1* 9.9* 9.8* 10.4*  HCT 27.5* 28.4* 27.3* 29.0* 29.1* 31.4*  MCV 95.2 98.3 96.1  --  96.0 96.6  PLT 110* 128* 155  --  208 232   Cardiac Enzymes: No results for input(s): CKTOTAL, CKMB, CKMBINDEX, TROPONINI in the last 168 hours. CBG: Recent Labs  Lab 05/25/21 1223 05/25/21 1504 05/25/21 1911 05/25/21 2350 05/26/21 0308  GLUCAP 137* 124* 118* 121* 141*    Iron Studies: No results for input(s): IRON, TIBC, TRANSFERRIN, FERRITIN in the last 72 hours. Studies/Results: US RENAL  Result Date: 05/24/2021 CLINICAL DATA:  Acute kidney injury. EXAM: RENAL / URINARY TRACT ULTRASOUND COMPLETE COMPARISON:  CT chest abdomen and pelvis 04/28/2021.  FINDINGS: Right Kidney: Renal measurements: 10.3 x 5.4 x 3.9 cm = volume: 113 mL. Echogenicity within normal limits. No mass or hydronephrosis visualized. Left Kidney: Renal measurements: 10.8 by 5.2 x 4.7 cm = volume: 138 mL. Echogenicity within normal limits. No mass or hydronephrosis visualized. Bladder: Not seen. Other: Sonographer notes left upper quadrant structure containing mobile debris, possibly dilated stomach. IMPRESSION: 1. Kidneys appear within normal limits. 2. Bladder not well seen. 3. Indeterminate left upper quadrant structure containing mobile debris, possibly dilated stomach. Please correlate clinically. Electronically Signed   By: Ronney Asters M.D.   On: 05/24/2021 20:41   DG CHEST PORT 1 VIEW  Result Date: 05/24/2021 CLINICAL DATA:  Central line placement EXAM: PORTABLE CHEST 1 VIEW COMPARISON:  05/23/2021 FINDINGS: Right IJ temporary dialysis catheter tip proximal to mid SVC level. Endotracheal tube in the mid trachea well above the carina. NG tube enters the stomach with the tip not visualized. Right PICC line tip SVC RA junction. Right hemidiaphragm remains elevated. Bilateral patchy airspace opacities, most pronounced in the right upper lobe and left lower lung. Overall, little interval change in aeration. No large effusion or pneumothorax. IMPRESSION: Right IJ temporary dialysis catheter tip proximal to mid SVC level. Other support apparatus stable. Similar bilateral airspace opacities. Electronically Signed   By: Jerilynn Mages.  Shick M.D.   On: 05/24/2021 15:52    Medications: Infusions:  sodium chloride 10 mL/hr at 05/26/21 0700   sodium chloride     ceFEPime (MAXIPIME) IV Stopped (05/25/21 2243)   dexmedetomidine (PRECEDEX) IV infusion Stopped (05/25/21 1225)   feeding supplement (VITAL 1.5 CAL) 1,000 mL (05/25/21 1408)   fentaNYL infusion INTRAVENOUS 125 mcg/hr (05/26/21 0700)   norepinephrine (LEVOPHED) Adult infusion 24 mcg/min (05/26/21 0700)   prismasol BGK 0/2.5      prismasol BGK 0/2.5     prismasol BGK 2/2.5 dialysis solution 1,500 mL/hr at 05/26/21 0713   vancomycin     vasopressin 0.04 Units/min (05/26/21 0700)    Scheduled Medications:  amiodarone  400 mg Per Tube BID   Followed by   Derrill Memo ON 05/27/2021] amiodarone  200 mg Per Tube Daily   chlorhexidine gluconate (MEDLINE KIT)  15 mL Mouth Rinse BID   Chlorhexidine Gluconate Cloth  6 each Topical Q0600   feeding supplement (PROSource TF)  90 mL Per Tube TID   heparin  5,000 Units Subcutaneous Q8H   insulin aspart  0-9 Units Subcutaneous Q4H   levothyroxine  25 mcg Per Tube Q0600   mouth rinse  15 mL Mouth Rinse 10 times  per day   midazolam  4 mg Intravenous Once   pantoprazole sodium  40 mg Per Tube QHS   polyethylene glycol  17 g Per Tube Daily   sodium chloride flush  10-40 mL Intracatheter Q12H   sodium chloride flush  10-40 mL Intracatheter Q12H    have reviewed scheduled and prn medications.  Physical Exam: General: Critically ill looking male, not responsive, intubated. Heart:RRR, s1s2 nl Lungs: Coarse breath sound bilateral Abdomen:soft,  non-distended Extremities: Trace dependent edema Dialysis Access: IJ temporary HD catheter placed by CCM on 12/7.  Abir Craine Reesa Chew Criston Chancellor 05/26/2021,7:21 AM  LOS: 10 days

## 2021-05-26 NOTE — Progress Notes (Signed)
NAME:  Brent Yu, MRN:  [id number], DOB:  July 01, 1932, LOS: 40 ADMISSION DATE:  05/12/2021, CONSULTATION DATE:  11/29 REFERRING MD:  Grandville Silos, CHIEF COMPLAINT:  resp failure, sepsis, AMS   History of Present Illness:  Brent Yu is a 87 y.o. M who began having jerky movements ~11/26 while walking and talking. Originally he and his wife had agreed to seek medical attention for these symptoms 11/29 however he fell out of bed and struck his head after waking early 11/29. He was brought to [facility] who reported an initial BP of 50/30, hypoxia in the 70's on room air, AMS, and evidence of head trauma. He received epi en route and given 400 cc NS bolus to [facility] ED as a trauma patient. He was assessed and cleared from a trauma standpoint however he was found to have leukocytosis, bilateral airspace disease on CT, and hypotension.  He was intubated and central line was placed. PCCM was consulted for further management  Pertinent  Medical History  Gilbert's syndrome, HTN, HLD, hypothyroidism, SIADH, and urinary incontinence   Significant Hospital Events: Including procedures, antibiotic start and stop dates in addition to other pertinent events   04/30/2021 Intubated 05/15/2021 Started on vasopressors 05/15/2021 CT of the head neck negative, CT of chest with bilateral airspace disease 05/18/2021 Added vasopressin for worsening shock 05/19/2021 Improving pressor requirement  12/5 Increasing levo and vaso requirements overnight and sedation 12/6 Worsening kidney function, consulted nephro and palliative care 12/7 Started on CRRT overnight  12/8 Increasing mech ventilation requirements, had episodes of vomiting overnight. Remains encephalopathic despite weaning sedation   Interim History / Subjective:  Patient unable to follow any commands, not responding to noxious stimuli despite weaning of sedation. Had bowel movement this morning.   Objective   Blood pressure (!) 117/40, pulse 75, temperature  99.4 F (37.4 C), temperature source Axillary, resp. rate (!) 27, height 5\' 9"  (1.753 m), weight 76.2 kg, SpO2 98 %.    Vent Mode: PRVC FiO2 (%):  [80 %-100 %] 100 % Set Rate:  [20 bmp] 20 bmp Vt Set:  [550 mL] 550 mL PEEP:  [8 cmH20-12 cmH20] 12 cmH20 Plateau Pressure:  [22 cmH20-32 cmH20] 32 cmH20   Intake/Output Summary (Last 24 hours) at 05/26/2021 0630 Last data filed at 05/26/2021 0600 Gross per 24 hour  Intake 2568.77 ml  Output 6984 ml  Net -4415.23 ml   Filed Weights   05/24/21 0341 05/25/21 0413 05/26/21 0500  Weight: 84.1 kg 82.2 kg 76.2 kg    Examination: General: ill appearing, NAD. On ventilator  HENT: [place], AT. R IJ HD cath in place from 12/7 Lungs: Coarse breath sounds diffusely Cardiovascular: RRR, no murmurs Abdomen: Soft, nontender, +BS Extremities: no LE edema Neuro: Sedated, not able to follow commands GU: External foley  Resolved Hospital Problem list   Hyperkalemia Hypocalcemia Afib  Assessment & Plan:  Acute hypoxic respiratory failure 2/2 to MSSA/E coli/H. Influenza pneumonia Elevated triglycerides  Patient remains dependent on  mechanical ventilation: PRVC with RR of 20, TV 550, PEEP 12, FiO2 100%, plateau pressure of 32 (peak pressure 37). Patient is s/p course of azithromycin on 12/3 and cefepime on 12/5. Repeat tracheal aspirate with abundant staph aureus and patient was started on abx yesterday.  - Continue vanc and cefepime - Duonebs PRN - Continue mechanical ventilation, unable to wean at this time - Maintain SpO2 >90% - Palliative care consulted, plan for family meeting today - Continue fentanyl gtt    Septic shock  2/2 PNA - Maintain MAP >65 - Continue levophed and vasopressin, wean as able - abx as above   Acute renal insufficiency Significantly diminished urine production over the last few days, with no urine output recorded over the last 24 hrs. Started on CRRT on 12/7 in the setting of worsening kidney function had 6.7 L output  from dialysis yesterday. K remains elevated at 5.5, BUN/Cr 57/2.58. Had to decrease CRRT rate, as patient had increasing in pressor requirement - Nephrology consulted, appreciate recs - CRRT with net neg 50cc/hr - Continue to monitor renal function - Avoid nephrotoxic agents  Acute encephalopathy EEG 11/29 with diffuse encephalopathy, negative for seizures   Afib with RVR > in sinus rhythm now Continue amiodarone 400 mg daily x 7 days, transition to 200 mg on 12/10    Anemia of chronic disease Hb stable at 10.4 today. Transfuse if Hb <7  History of Gilbert's syndrome Monitor LFTs and bili   Hypothyroidism Continue home levothyroxine 25 mcg daily   SIADH Sodium 135 today. Patient takes salt tabs at home. - Continue to hold salt tabs  Best Practice (right click and "Reselect all SmartList Selections" daily)   Diet/type: tubefeeds DVT prophylaxis: prophylactic heparin  GI prophylaxis: PPI Lines: R brachial PICC, R IJ HD catheter Foley:  External urinary catheter Code Status:  full code Last date of multidisciplinary goals of care discussion [Family updated 12/8]  Labs   CBC: Recent Labs  Lab 05/22/21 0245 05/23/21 0304 05/24/21 0329 05/24/21 0956 05/25/21 0418 05/26/21 0539  WBC 16.2* 20.1* 18.9*  --  31.0* 33.1*  HGB 9.1* 9.2* 9.1* 9.9* 9.8* 10.4*  HCT 27.5* 28.4* 27.3* 29.0* 29.1* 31.4*  MCV 95.2 98.3 96.1  --  96.0 96.6  PLT 110* 128* 155  --  208 232    Basic Metabolic Panel: Recent Labs  Lab 05/23/21 0304 05/24/21 0329 05/24/21 0956 05/24/21 1555 05/25/21 0418 05/25/21 1558  NA 146* 144 146* 143 136  136 141  K 4.4 5.1 5.4* 5.6* 5.6*  5.7* 5.5*  CL 112* 110  --  107 102  102 101  CO2 22 22  --  21* 22  22 26   GLUCOSE 167* 132*  --  116* 209*  224* 127*  BUN 90* 111*  --  114* 85*  86* 68*  CREATININE 3.14* 4.03*  --  4.60* 3.68*  3.76* 3.10*  CALCIUM 7.9* 7.9*  --  7.6* 7.7*  7.7* 8.0*  MG 2.1 2.1  --   --  2.2  --   PHOS 3.9 4.7*  --   5.2* 5.4*  5.4* 6.8*   GFR: Estimated Creatinine Clearance: 17.1 mL/min (A) (by C-G formula based on SCr of 3.1 mg/dL (H)). Recent Labs  Lab 05/19/21 0807 05/19/21 1107 05/20/21 0455 05/23/21 0304 05/24/21 0329 05/25/21 0418 05/26/21 0539  WBC  --   --    < > 20.1* 18.9* 31.0* 33.1*  LATICACIDVEN 2.1* 1.3  --   --   --   --   --    < > = values in this interval not displayed.    Liver Function Tests: Recent Labs  Lab 05/21/21 0244 05/24/21 1555 05/25/21 0418 05/25/21 1558  AST 55*  --   --   --   ALT 126*  --   --   --   ALKPHOS 128*  --   --   --   BILITOT 1.1  --   --   --   PROT  5.3*  --   --   --   ALBUMIN 1.6* <1.5* <1.5* <1.5*   No results for input(s): LIPASE, AMYLASE in the last 168 hours.  No results for input(s): AMMONIA in the last 168 hours.  ABG    Component Value Date/Time   PHART 7.292 (L) 05/24/2021 0956   PCO2ART 47.9 05/24/2021 0956   PO2ART 56 (L) 05/24/2021 0956   HCO3 23.0 05/24/2021 0956   TCO2 24 05/24/2021 0956   ACIDBASEDEF 3.0 (H) 05/24/2021 0956   O2SAT 84.0 05/24/2021 0956     Coagulation Profile: No results for input(s): INR, PROTIME in the last 168 hours.  Cardiac Enzymes: No results for input(s): CKTOTAL, CKMB, CKMBINDEX, TROPONINI in the last 168 hours.  HbA1C: No results found for: HGBA1C  CBG: Recent Labs  Lab 05/25/21 1223 05/25/21 1504 05/25/21 1911 05/25/21 2350 05/26/21 0308  GLUCAP 137* 124* 118* 121* 141*    Review of Systems:     Critical care time: 35 minutes

## 2021-05-26 NOTE — Procedures (Signed)
Central Venous Catheter Insertion Procedure Note  Brent Yu  [id number]  1933-03-13  Date:05/26/21  Time:4:32 PM   Provider Performing:Inis Borneman Wilford Grist   Procedure: Insertion of Non-tunneled Central Venous Catheter(36556)with US guidance (93790)    Indication(s) Hemodialysis  Consent Risks of the procedure as well as the alternatives and risks of each were explained to the patient and/or caregiver.  Consent for the procedure was obtained and is signed in the bedside chart  Anesthesia Fentanyl gtt  Timeout Verified patient identification, verified procedure, site/side was marked, verified correct patient position, special equipment/implants available, medications/allergies/relevant history reviewed, required imaging and test results available.  Sterile Technique Maximal sterile technique including full sterile barrier drape, hand hygiene, sterile gown, sterile gloves, mask, hair covering, sterile ultrasound probe cover (if used).  Procedure Description Area of catheter insertion was cleaned with chlorhexidine and draped in sterile fashion.   With real-time ultrasound guidance a HD catheter was placed into the right femoral vein.  Nonpulsatile blood flow and easy flushing noted in all ports.  The catheter was sutured in place and sterile dressing applied.  Complications/Tolerance None; patient tolerated the procedure well. Chest X-ray is ordered to verify placement for internal jugular or subclavian cannulation.  Chest x-ray is not ordered for femoral cannulation.  EBL Minimal  Specimen(s) None

## 2021-05-26 NOTE — Procedures (Signed)
Arterial Catheter Insertion Procedure Note  ONIEL MELESKI  [id number]  1932-11-05  Date:05/26/21  Time:8:13 AM    Provider Performing: Jamal Collin   Procedure: Insertion of Arterial Line (27062) without US guidance  Indication(s) Blood pressure monitoring and/or need for frequent ABGs  Consent Unable to obtain consent due to emergent nature of procedure.  Anesthesia None   Time Out Verified patient identification, verified procedure, site/side was marked, verified correct patient position, special equipment/implants available, medications/allergies/relevant history reviewed, required imaging and test results available.   Sterile Technique Maximal sterile technique including full sterile barrier drape, hand hygiene, sterile gown, sterile gloves, mask, hair covering, sterile ultrasound probe cover (if used).   Procedure Description Area of catheter insertion was cleaned with chlorhexidine and draped in sterile fashion. Without real-time ultrasound guidance an arterial catheter was placed into the left radial artery.  Appropriate arterial tracings confirmed on monitor.     Complications/Tolerance None; patient tolerated the procedure well.   EBL Minimal   Specimen(s) None

## 2021-05-26 NOTE — Progress Notes (Addendum)
Palliative Medicine Inpatient Follow Up Note  Consulting Provider: Harvie Heck, MD   Reason for consult:   Brent Yu Palliative Medicine Consult  Reason for Consult? Goals of care    HPI:  Per intake H&P --> Brent Yu is a 87 y.o. M who began having jerky movements ~11/26 while walking and talking. Originally he and his wife had agreed to seek medical attention for these symptoms 11/29 however he fell out of bed and struck his head after waking early 11/29. He was brought to BIB EMS who reported an initial BP of 50/30, hypoxia in the 70's on room air, AMS, and evidence of head trauma. He received epi en route and given 400 cc NS bolus to [facility] ED as a trauma patient. He was assessed and cleared from a trauma standpoint however he was found to have leukocytosis, bilateral airspace disease on CT, and hypotension.  He was intubated and central line was placed.    Brent Yu has remained intubated and sedated over the past eight days. His clinical course has been fluctuant though overall he is not making great improvement. The Palliative care team was asked to further discuss goals of care with the patients family.   Today's Discussion (05/26/2021):  *Please note that this is a verbal dictation therefore any spelling or grammatical errors are due to the "[employer] [employer]" system interpretation.  Chart reviewed. I called patients spouse, Brent Yu this morning to verify a meeting time. She shares that presently she is with her son, her daughter is in transit from [place], [place]. We reviewed Brent Yu's poor clinical state and lack of improvements. Brent Yu expresses how overwhelming this has all been for her.   We reviewed that she has considered the idea of a DNR more thoroughly. She also discussed this with her son. She has decided to move forward with a DNAR order as she does not believe any additional resuscitative efforts will improve Brent Yu's outcomes at this time.   I shared the  importance of [place] all meeting together. She expressed that she will determine a time with her children and get back to me.  Questions and concerns addressed  _____________________________________________________ Family Meeting Held at [facility]:  Participants: Brent Yu (spouse), Brent Yu (son), Brent Yu (daughter)  Health care team members: Dr. Raymondo Band, Lyla Son, RN, Dr. Phineas Real, Tacey Ruiz, [facility]  Met with patient's family this afternoon in addition to the medical team.  A review of Brent Yu's acute medical conditions was held.  A careful summary of his intensive care unit stay was completed by Dr. Raymondo Band.  We reviewed the multiple organ systems which are failing Brent Yu inclusive of his pulmonary, cardiovascular, and kidneys.  Discussed that he has been weaned down on sedation and is not having a great neurological response.  Dr. Raymondo Band shares that [facility] present HD catheter is clotting inhibiting CRRT from continuing.  A long discussion was held over the risks and benefits of inserting another central line to continue CRRT.  Patient's wife, Brent Yu is adamant that she would like another line placed as her other son, Brent Yu will be traveling from [place] to see Brent Yu ideally before he passes away.  We had great conversation about whether or not we want to put Brent Yu through another invasive procedure which may not truly benefit him in the long run.  Brent Yu again shares the wish to continue this measure in an effort to allow her son to see his father 1 last time.  Discussed placing a time limit on  how long these interventions will continue.  Patient's son Brent Yu expresses that they do not plan to keep their father alive indefinitely.  We reviewed that within the next 48 hours a decision will need to be made regarding if we continue this present course or transition to comfort.  Patient's spouse Brent Yu is fairly certain and sharing we will transition to comfort measures once all children are present and have been  able to see Brent Yu.  Brent Yu is aware that all interventions have been pursued in an effort to improve Gerrys health outcomes though unfortunately his pneumonia is quite complicated.  He is also endured additional aspirational events which have made treating him more difficult.  Dr. Phineas Real was able to provide a comprehensive update in regard to patient's renal function.  He shares that if the desire of the family is more time he is not opposed to continuing CRRT inclusive of Placement to enable the same level of care and hopefully enough time for patients on to get to the hospital.  A summary of our conversation was had inclusive of 1.)  Verifying DNAR status 2.)  Identifying the plan for another central line placement to continue CRRT 3.)  The plan for 48 hours from now making a decision related to comfort measures once all family have been able to see Brent Yu  After meeting commenced a review visitation policies was held.  Objective Assessment: Vital Signs Vitals:   05/26/21 0845 05/26/21 0900  BP:    Pulse: 83 72  Resp: (!) 33 (!) 32  Temp:    SpO2: 98% 98%    Intake/Output Summary (Last 24 hours) at 05/26/2021 1000 Last data filed at 05/26/2021 0900 Gross per 24 hour  Intake 2298.24 ml  Output 6271 ml  Net -3972.76 ml   Last Weight  Most recent update: 05/26/2021  5:39 AM    Weight  76.2 kg (167 lb 15.9 oz)            Gen:  Critically ill Caucasian elderly M HEENT: Intubated, (+) OGT CV: Regular rate and rhythm PULM: Mechanical ventilator ABD: Distended EXT: Generalized edema Neuro: On fentanyl, somnolence  SUMMARY OF RECOMMENDATIONS   DNAR  Living will brought in by Family  Continue current measures of support, patient's spouse would like to allow all children the opportunity to visit with Brent Yu.  As of presently we are waiting his son to arrive from [place] with estimated an arrival date of tomorrow   Spiritual support is provided through patients personal  [name]   Ongoing palliative care  Time Spent: 98 minutes Greater than 50% of the time was spent in counseling and coordination of care ______________________________________________________________________________________ [place] Team Team Cell Phone: [phone] Please utilize secure chat with additional questions, if there is no response within 30 minutes please call the above phone number  Palliative Medicine Team providers are available by phone from 7am to 7pm daily and can be reached through the team cell phone.  Should this patient require assistance outside of these hours, please call the patient's attending physician.

## 2021-05-27 DIAGNOSIS — J9602 Acute respiratory failure with hypercapnia: Secondary | ICD-10-CM | POA: Diagnosis not present

## 2021-05-27 DIAGNOSIS — J9601 Acute respiratory failure with hypoxia: Secondary | ICD-10-CM | POA: Diagnosis not present

## 2021-05-27 LAB — RENAL FUNCTION PANEL
Albumin: <1.5 g/dL — ABNORMAL LOW (ref 3.5–5.0)
Albumin: <1.5 g/dL — ABNORMAL LOW (ref 3.5–5.0)
Anion gap: 14 (ref 5–15)
Anion gap: 13 (ref 5–15)
BUN: 55 mg/dL — ABNORMAL HIGH (ref 8–23)
BUN: 53 mg/dL — ABNORMAL HIGH (ref 8–23)
CO2: 21 mmol/L — ABNORMAL LOW (ref 22–32)
CO2: 22 mmol/L (ref 22–32)
Calcium: 7.6 mg/dL — ABNORMAL LOW (ref 8.9–10.3)
Calcium: 7.7 mg/dL — ABNORMAL LOW (ref 8.9–10.3)
Chloride: 99 mmol/L (ref 98–111)
Chloride: 100 mmol/L (ref 98–111)
Creatinine, Ser: 2 mg/dL — ABNORMAL HIGH (ref 0.61–1.24)
Creatinine, Ser: 1.77 mg/dL — ABNORMAL HIGH (ref 0.61–1.24)
GFR, Estimated: 32 mL/min — ABNORMAL LOW (ref >60)
GFR, Estimated: 37 mL/min — ABNORMAL LOW (ref >60)
Glucose, Bld: 143 mg/dL — ABNORMAL HIGH (ref 70–99)
Glucose, Bld: 133 mg/dL — ABNORMAL HIGH (ref 70–99)
Phosphorus: 7.4 mg/dL — ABNORMAL HIGH (ref 2.5–4.6)
Phosphorus: 6.2 mg/dL — ABNORMAL HIGH (ref 2.5–4.6)
Potassium: 4.6 mmol/L (ref 3.5–5.1)
Potassium: 4.3 mmol/L (ref 3.5–5.1)
Sodium: 134 mmol/L — ABNORMAL LOW (ref 135–145)
Sodium: 135 mmol/L (ref 135–145)

## 2021-05-27 LAB — POCT I-STAT 7, (LYTES, BLD GAS, ICA,H+H)
Acid-base deficit: 1 mmol/L (ref 0.0–2.0)
Bicarbonate: 26.5 mmol/L (ref 20.0–28.0)
Calcium, Ion: 1.03 mmol/L — ABNORMAL LOW (ref 1.15–1.40)
HCT: 29 % — ABNORMAL LOW (ref 39.0–52.0)
Hemoglobin: 9.9 g/dL — ABNORMAL LOW (ref 13.0–17.0)
O2 Saturation: 100 %
Patient temperature: 97.1
Potassium: 4.2 mmol/L (ref 3.5–5.1)
Sodium: 135 mmol/L (ref 135–145)
TCO2: 28 mmol/L (ref 22–32)
pCO2 arterial: 52.9 mmHg — ABNORMAL HIGH (ref 32.0–48.0)
pH, Arterial: 7.303 — ABNORMAL LOW (ref 7.350–7.450)
pO2, Arterial: 339 mmHg — ABNORMAL HIGH (ref 83.0–108.0)

## 2021-05-27 LAB — PHOSPHORUS: Phosphorus: 7.4 mg/dL — ABNORMAL HIGH (ref 2.5–4.6)

## 2021-05-27 LAB — CBC
HCT: 29.7 % — ABNORMAL LOW (ref 39.0–52.0)
Hemoglobin: 9.6 g/dL — ABNORMAL LOW (ref 13.0–17.0)
MCH: 31.1 pg (ref 26.0–34.0)
MCHC: 32.3 g/dL (ref 30.0–36.0)
MCV: 96.1 fL (ref 80.0–100.0)
Platelets: 220 10*3/uL (ref 150–400)
RBC: 3.09 MIL/uL — ABNORMAL LOW (ref 4.22–5.81)
RDW: 16 % — ABNORMAL HIGH (ref 11.5–15.5)
WBC: 21.8 10*3/uL — ABNORMAL HIGH (ref 4.0–10.5)
nRBC: 0.2 % (ref 0.0–0.2)

## 2021-05-27 LAB — GLUCOSE, CAPILLARY
Glucose-Capillary: 130 mg/dL — ABNORMAL HIGH (ref 70–99)
Glucose-Capillary: 130 mg/dL — ABNORMAL HIGH (ref 70–99)
Glucose-Capillary: 111 mg/dL — ABNORMAL HIGH (ref 70–99)
Glucose-Capillary: 126 mg/dL — ABNORMAL HIGH (ref 70–99)
Glucose-Capillary: 127 mg/dL — ABNORMAL HIGH (ref 70–99)
Glucose-Capillary: 120 mg/dL — ABNORMAL HIGH (ref 70–99)

## 2021-05-27 LAB — BASIC METABOLIC PANEL
Anion gap: 14 (ref 5–15)
BUN: 51 mg/dL — ABNORMAL HIGH (ref 8–23)
CO2: 21 mmol/L — ABNORMAL LOW (ref 22–32)
Calcium: 7.6 mg/dL — ABNORMAL LOW (ref 8.9–10.3)
Chloride: 99 mmol/L (ref 98–111)
Creatinine, Ser: 1.99 mg/dL — ABNORMAL HIGH (ref 0.61–1.24)
GFR, Estimated: 32 mL/min — ABNORMAL LOW (ref >60)
Glucose, Bld: 144 mg/dL — ABNORMAL HIGH (ref 70–99)
Potassium: 4.6 mmol/L (ref 3.5–5.1)
Sodium: 134 mmol/L — ABNORMAL LOW (ref 135–145)

## 2021-05-27 LAB — MAGNESIUM: Magnesium: 2.8 mg/dL — ABNORMAL HIGH (ref 1.7–2.4)

## 2021-05-27 MED ORDER — VITAL 1.5 CAL PO LIQD
1000.0000 mL | ORAL | Status: DC
  Administered 2021-05-27: 1000 mL

## 2021-05-27 MED ORDER — SODIUM CHLORIDE 0.9 % IV SOLN
2.0000 g | INTRAVENOUS | Status: DC
  Administered 2021-05-27: 2 g via INTRAVENOUS
  Filled 2021-05-27: qty 20

## 2021-05-27 NOTE — Progress Notes (Signed)
[facility] NEPHROLOGY PROGRESS NOTE  Assessment/ Plan: Pt is a 87 y.o. yo male with history of hypertension, HLD, hypothyroidism, SIADH, Gilbert's syndrome, urinary incontinence admitted with fall, altered mental status due to septic shock, pneumonia and now AKI and respiratory failure.  #Acute kidney injury likely ATN due to septic shock: CRRT started on 12/7 for hyperkalemia, azotemia and decreasing urine output.  No sign of renal or clinical recovery.  We had a family meeting in the presence of primary team and palliative care on 12/9.  Decided to continue CRRT for 24 to 48 hours until one of the family members who arrives from out of town.  He is DNR and plan to discontinue CRRT and comfort care when family arrive.  For now continue current CRRT prescription.  #Septic shock due to pneumonia: Currently on Levophed and vasopressin.  Monitor BP.  #Ventilator dependent respiratory failure/influenza pneumonia/MSSA: Currently on vancomycin and cefepime.  #Hyperkalemia: Managing with CRRT fluid.  #Hyperphosphatemia: Expect to improve with CRRT.  #SIADH/hyponatremia: Managing with CRRT, monitor sodium level.  #Anemia: Hemoglobin mildly downtrending.  No sign of active bleeding.  Discussed with ICU nurse.  Subjective: Seen and examined in ICU.  No urine output.  Requiring Levophed, vasopressin.  Tolerating CRRT well. Objective Vital signs in last 24 hours: Vitals:   05/27/21 0815 05/27/21 0830 05/27/21 0845 05/27/21 0900  BP:      Pulse: 98 (!) 112 (!) 104 (!) 117  Resp: (!) 31 (!) 31 (!) 31 (!) 27  Temp:      TempSrc:      SpO2: 100% 100% 100% 100%  Weight:      Height:       Weight change: -1.6 kg  Intake/Output Summary (Last 24 hours) at 05/27/2021 0912 Last data filed at 05/27/2021 0905 Gross per 24 hour  Intake 1942.83 ml  Output 1148 ml  Net 794.83 ml        Labs: Basic Metabolic Panel: Recent Labs  Lab 05/26/21 0539 05/26/21 0822 05/26/21 1132  05/26/21 1722 05/27/21 0340  NA 137  135   < > 136 135 134*  134*  K 5.6*  5.5*   < > 5.4* 5.9* 4.6  4.6  CL 94*  92*  --   --  100 99  99  CO2 24  24  --   --  21* 21*  21*  GLUCOSE 138*  138*  --   --  131* 144*  143*  BUN 57*  57*  --   --  65* 51*  55*  CREATININE 2.55*  2.58*  --   --  2.66* 1.99*  2.00*  CALCIUM 7.5*  7.6*  --   --  7.5* 7.6*  7.6*  PHOS 6.4*  6.5*  --   --  8.6* 7.4*  7.4*   < > = values in this interval not displayed.    Liver Function Tests: Recent Labs  Lab 05/21/21 0244 05/24/21 1555 05/26/21 0539 05/26/21 1722 05/27/21 0340  AST 55*  --   --   --   --   ALT 126*  --   --   --   --   ALKPHOS 128*  --   --   --   --   BILITOT 1.1  --   --   --   --   PROT 5.3*  --   --   --   --   ALBUMIN 1.6*   < > <1.5* <1.5* <1.5*   < > =  values in this interval not displayed.    No results for input(s): LIPASE, AMYLASE in the last 168 hours. No results for input(s): AMMONIA in the last 168 hours. CBC: Recent Labs  Lab 05/23/21 0304 05/24/21 0329 05/24/21 0956 05/25/21 0418 05/26/21 0539 05/26/21 0822 05/26/21 1010 05/26/21 1132 05/27/21 0340  WBC 20.1* 18.9*  --  31.0* 33.1*  --   --   --  21.8*  HGB 9.2* 9.1*   < > 9.8* 10.4*   < > 10.9* 10.9* 9.6*  HCT 28.4* 27.3*   < > 29.1* 31.4*   < > 32.0* 32.0* 29.7*  MCV 98.3 96.1  --  96.0 96.6  --   --   --  96.1  PLT 128* 155  --  208 232  --   --   --  220   < > = values in this interval not displayed.    Cardiac Enzymes: No results for input(s): CKTOTAL, CKMB, CKMBINDEX, TROPONINI in the last 168 hours. CBG: Recent Labs  Lab 05/26/21 1513 05/26/21 1906 05/26/21 2354 05/27/21 0307 05/27/21 0711  GLUCAP 128* 119* 124* 130* 130*     Iron Studies: No results for input(s): IRON, TIBC, TRANSFERRIN, FERRITIN in the last 72 hours. Studies/Results: No results found.  Medications: Infusions:  sodium chloride 10 mL/hr at 05/27/21 0900   sodium chloride     cefTRIAXone  (ROCEPHIN)  IV Stopped (05/27/21 0751)   feeding supplement (VITAL 1.5 CAL) 1,000 mL (05/25/21 1408)   fentaNYL infusion INTRAVENOUS 200 mcg/hr (05/27/21 0900)   norepinephrine (LEVOPHED) Adult infusion 16 mcg/min (05/27/21 0900)   prismasol BGK 0/2.5 500 mL/hr at 05/27/21 0047   prismasol BGK 0/2.5 500 mL/hr at 05/27/21 0046   prismasol BGK 2/2.5 dialysis solution 1,500 mL/hr at 05/27/21 0803   vasopressin 0.04 Units/min (05/27/21 0900)    Scheduled Medications:  amiodarone  200 mg Per Tube Daily   chlorhexidine gluconate (MEDLINE KIT)  15 mL Mouth Rinse BID   Chlorhexidine Gluconate Cloth  6 each Topical Q0600   feeding supplement (PROSource TF)  90 mL Per Tube TID   heparin  5,000 Units Subcutaneous Q8H   hydrocortisone sod succinate (SOLU-CORTEF) inj  100 mg Intravenous Q12H   insulin aspart  0-9 Units Subcutaneous Q4H   levothyroxine  25 mcg Per Tube Q0600   mouth rinse  15 mL Mouth Rinse 10 times per day   midazolam  4 mg Intravenous Once   pantoprazole sodium  40 mg Per Tube QHS   polyethylene glycol  17 g Per Tube Daily   sodium chloride flush  10-40 mL Intracatheter Q12H   sodium chloride flush  10-40 mL Intracatheter Q12H    have reviewed scheduled and prn medications.  Physical Exam: General: Critically ill looking male, not responding and intubated. Heart:RRR, s1s2 nl Lungs: Coarse breath sound bilateral Abdomen:soft,  non-distended Extremities: Trace dependent edema Dialysis Access: IJ temporary HD catheter placed by CCM on 12/7.  Alonia Dibuono Prasad Quasean Frye 05/27/2021,9:12 AM  LOS: 11 days

## 2021-05-27 NOTE — Progress Notes (Signed)
Palliative Medicine Inpatient Follow Up Note  Consulting Provider: Eliezer Bottom, MD   Reason for consult:   Palliative Care Consult Services Palliative Medicine Consult  Reason for Consult? Goals of care    HPI:  Per intake H&P --> Brent Yu is a 87 y.o. M who began having jerky movements ~11/26 while walking and talking. Originally he and his wife had agreed to seek medical attention for these symptoms 11/29 however he fell out of bed and struck his head after waking early 11/29. He was brought to [facility] who reported an initial BP of 50/30, hypoxia in the 70's on room air, AMS, and evidence of head trauma. He received epi en route and given 400 cc NS bolus to [facility] ED as a trauma patient. He was assessed and cleared from a trauma standpoint however he was found to have leukocytosis, bilateral airspace disease on CT, and hypotension.  He was intubated and central line was placed.    Brent Yu has remained intubated and sedated over the past eight days. His clinical course has been fluctuant though overall he is not making great improvement. The Palliative care team was asked to further discuss goals of care with the patients family.   Today's Discussion (05/27/2021):  *Please note that this is a verbal dictation therefore any spelling or grammatical errors are due to the "[employer]" system interpretation.  Chart reviewed.   I spoke to patients spouse, [name] this morning. She shares that her other son will arrive at 4PM this afternoon. I asked [name] if she will feel ready to transition our focus to comfort care this evening. She shares with me that she wants to be well rested prior to doing this. She expresses that tomorrow would be the likely best time to do this.   I shared with her that our goal it that family are allowed the opportunity to come and spend time with the patient. We reviewed that Brent Yu may certainly pass at anytime.   Palliative support provided.  Objective  Assessment: Vital Signs Vitals:   05/27/21 1030 05/27/21 1045  BP:    Pulse: (!) 129 (!) 103  Resp: (!) 39 (!) 31  Temp:    SpO2: 100% 100%    Intake/Output Summary (Last 24 hours) at 05/27/2021 1100 Last data filed at 05/27/2021 1044 Gross per 24 hour  Intake 1498.52 ml  Output 912 ml  Net 586.52 ml    Last Weight  Most recent update: 05/27/2021  4:29 AM    Weight  74.6 kg (164 lb 7.4 oz)            Gen:  Critically ill Caucasian elderly M HEENT: Intubated, (+) OGT CV: Irregular rate and rhythm PULM: Mechanical ventilator ABD: Soft EXT: Generalized edema Neuro: Somnolence  SUMMARY OF RECOMMENDATIONS   DNAR  Patients son arrive at 4PM today  Patients spouse expresses that they will likely transition to comfort care tomorrow with liberation from ventilatory support - she is aware that he may pass prior to this  Spiritual support is provided through patients personal [name]   Ongoing palliative care  Time Spent: 25 minutes Greater than 50% of the time was spent in counseling and coordination of care ______________________________________________________________________________________ Brent Yu Brent Yu Palliative Medicine Team Team Cell Phone: [phone] Please utilize secure chat with additional questions, if there is no response within 30 minutes please call the above phone number  Palliative Medicine Team providers are available by phone from 7am to 7pm  daily and can be reached through the team cell phone.  Should this patient require assistance outside of these hours, please call the patient's attending physician.

## 2021-05-27 NOTE — Progress Notes (Signed)
NAME:  Brent Yu, MRN:  [id number], DOB:  1933/03/11, LOS: 11 ADMISSION DATE:  04/19/2021, CONSULTATION DATE:  11/29 REFERRING MD:  Janee Morn, CHIEF COMPLAINT:  resp failure, sepsis, AMS   History of Present Illness:  Brent Yu is a 87 y.o. M who began having jerky movements ~11/26 while walking and talking. Originally he and his wife had agreed to seek medical attention for these symptoms 11/29 however he fell out of bed and struck his head after waking early 11/29. He was brought to [facility] who reported an initial BP of 50/30, hypoxia in the 70's on room air, AMS, and evidence of head trauma. He received epi en route and given 400 cc NS bolus to [facility] ED as a trauma patient. He was assessed and cleared from a trauma standpoint however he was found to have leukocytosis, bilateral airspace disease on CT, and hypotension.  He was intubated and central line was placed. PCCM was consulted for further management  Pertinent  Medical History  Gilbert's syndrome, HTN, HLD, hypothyroidism, SIADH, and urinary incontinence   Significant Hospital Events: Including procedures, antibiotic start and stop dates in addition to other pertinent events   04/27/2021 Intubated 04/27/2021 Started on vasopressors 05/01/2021 CT of the head neck negative, CT of chest with bilateral airspace disease 05/18/2021 Added vasopressin for worsening shock 05/19/2021 Improving pressor requirement  12/5 Increasing levo and vaso requirements overnight and sedation 12/6 Worsening kidney function, consulted nephro and palliative care 12/7 Started on CRRT overnight  12/8 Increasing mech ventilation requirements, had episodes of vomiting overnight. Remains encephalopathic despite weaning sedation  12/9 worsening mental status, family meeting; HD catheter exchanged  Interim History / Subjective:  Remains poorly responsive on pressors, mechanical ventilation and CRRT.  Objective   Blood pressure 112/64, pulse (!) 125,  temperature (!) 97.3 F (36.3 C), temperature source Axillary, resp. rate (!) 25, height 5\' 9"  (1.753 m), weight 74.6 kg, SpO2 100 %.    Vent Mode: PRVC FiO2 (%):  [100 %] 100 % Set Rate:  [20 bmp-31 bmp] 31 bmp Vt Set:  [550 mL] 550 mL PEEP:  [12 cmH20] 12 cmH20 Plateau Pressure:  [32 cmH20-35 cmH20] 35 cmH20   Intake/Output Summary (Last 24 hours) at 05/27/2021 0636 Last data filed at 05/27/2021 0600 Gross per 24 hour  Intake 1703.91 ml  Output 1388 ml  Net 315.91 ml    Filed Weights   05/25/21 0413 05/26/21 0500 05/27/21 0429  Weight: 82.2 kg 76.2 kg 74.6 kg   Examination: Constitutional: ill appearing unresponsive man on vent  Eyes: dilated but reactive pupils, symmetric, not tracking Ears, nose, mouth, and throat: ETT in place, small thick secretions Cardiovascular: tachycardic, ext warm Respiratory: Scattered rhonci, triggering vent Gastrointestinal: soft, hypoactive BS Skin: No rashes, normal turgor Neurologic: withdraws on lowers, pupils remain dilated but reactive, blinks to threat, cranial nerves are intact and symmetric Psychiatric: cannot assess  K improved with 0K bath WBC improved H/H stable  Resolved Hospital Problem list     Assessment & Plan:  Acute hypoxic respiratory failure recurrent Influenza E coli and H influenzae tx Persistent MSSA in sputum Question of aspiration leading to latest setback Septic shock 2/2 PNA - Continue vent support, VAP prevention bundle - Abx to CTX x 6 more days - LTVV as able - Continue levophed/vasopressin and stress steroids wean for MAP 65   Acute renal failure secondary to septic ATN Acute encephalopathy, ongoing issue during stay Afib with RVR > in sinus rhythm now -  Continues on trial of CRRT while family arrives from out of town - [place] - As was reactive, no indicaiton for full AC at this time   GOC- prolonged illness with numerous setbacks; concern for lack of progress. Family arriving from out  of town then may transition to more comfort-based approach if continues to [employer].  Appreciate palliative input.  Patient is DNR  Best Practice (right click and "Reselect all SmartList Selections" daily)   Diet/type: tubefeeds DVT prophylaxis: prophylactic heparin  GI prophylaxis: PPI Lines: R brachial PICC, R femoral HD catheter Foley:  External urinary catheter Code Status:  DBR Last date of multidisciplinary goals of care discussion [05/26/21]    Critical care time: 32 minutes

## 2021-05-27 NOTE — Plan of Care (Signed)
?  Problem: Education: ?Goal: Knowledge of General Education information will improve ?Description: Including pain rating scale, medication(s)/side effects and non-pharmacologic comfort measures ?Outcome: Not Progressing ?  ?Problem: Health Behavior/Discharge Planning: ?Goal: Ability to manage health-related needs will improve ?Outcome: Not Progressing ?  ?Problem: Clinical Measurements: ?Goal: Ability to maintain clinical measurements within normal limits will improve ?Outcome: Not Progressing ?Goal: Will remain free from infection ?Outcome: Not Progressing ?Goal: Diagnostic test results will improve ?Outcome: Not Progressing ?Goal: Respiratory complications will improve ?Outcome: Not Progressing ?Goal: Cardiovascular complication will be avoided ?Outcome: Not Progressing ?  ?Problem: Activity: ?Goal: Risk for activity intolerance will decrease ?Outcome: Not Progressing ?  ?Problem: Nutrition: ?Goal: Adequate nutrition will be maintained ?Outcome: Not Progressing ?  ?Problem: Coping: ?Goal: Level of anxiety will decrease ?Outcome: Not Progressing ?  ?Problem: Elimination: ?Goal: Will not experience complications related to bowel motility ?Outcome: Not Progressing ?Goal: Will not experience complications related to urinary retention ?Outcome: Not Progressing ?  ?Problem: Pain Managment: ?Goal: General experience of comfort will improve ?Outcome: Not Progressing ?  ?Problem: Safety: ?Goal: Ability to remain free from injury will improve ?Outcome: Not Progressing ?  ?Problem: Skin Integrity: ?Goal: Risk for impaired skin integrity will decrease ?Outcome: Not Progressing ?  ?Problem: Activity: ?Goal: Ability to tolerate increased activity will improve ?Outcome: Not Progressing ?  ?Problem: Respiratory: ?Goal: Ability to maintain a clear airway and adequate ventilation will improve ?Outcome: Not Progressing ?  ?Problem: Role Relationship: ?Goal: Method of communication will improve ?Outcome: Not Progressing ?  ?Problem:  Safety: ?Goal: Non-violent Restraint(s) ?Outcome: Not Progressing ?  ?

## 2021-05-28 ENCOUNTER — Inpatient Hospital Stay (HOSPITAL_COMMUNITY): Payer: Medicare Other

## 2021-05-28 DIAGNOSIS — J9602 Acute respiratory failure with hypercapnia: Secondary | ICD-10-CM | POA: Diagnosis not present

## 2021-05-28 DIAGNOSIS — J9601 Acute respiratory failure with hypoxia: Secondary | ICD-10-CM | POA: Diagnosis not present

## 2021-05-28 LAB — CBC
HCT: 26.3 % — ABNORMAL LOW (ref 39.0–52.0)
Hemoglobin: 8.7 g/dL — ABNORMAL LOW (ref 13.0–17.0)
MCH: 31.4 pg (ref 26.0–34.0)
MCHC: 33.1 g/dL (ref 30.0–36.0)
MCV: 94.9 fL (ref 80.0–100.0)
Platelets: 183 10*3/uL (ref 150–400)
RBC: 2.77 MIL/uL — ABNORMAL LOW (ref 4.22–5.81)
RDW: 15.5 % (ref 11.5–15.5)
WBC: 17.5 10*3/uL — ABNORMAL HIGH (ref 4.0–10.5)
nRBC: 0.3 % — ABNORMAL HIGH (ref 0.0–0.2)

## 2021-05-28 LAB — RENAL FUNCTION PANEL
Albumin: <1.5 g/dL — ABNORMAL LOW (ref 3.5–5.0)
Anion gap: 14 (ref 5–15)
BUN: 56 mg/dL — ABNORMAL HIGH (ref 8–23)
CO2: 19 mmol/L — ABNORMAL LOW (ref 22–32)
Calcium: 7.7 mg/dL — ABNORMAL LOW (ref 8.9–10.3)
Chloride: 102 mmol/L (ref 98–111)
Creatinine, Ser: 1.8 mg/dL — ABNORMAL HIGH (ref 0.61–1.24)
GFR, Estimated: 36 mL/min — ABNORMAL LOW (ref >60)
Glucose, Bld: 137 mg/dL — ABNORMAL HIGH (ref 70–99)
Phosphorus: 5.8 mg/dL — ABNORMAL HIGH (ref 2.5–4.6)
Potassium: 3.7 mmol/L (ref 3.5–5.1)
Sodium: 135 mmol/L (ref 135–145)

## 2021-05-28 LAB — TRIGLYCERIDES: Triglycerides: 169 mg/dL — ABNORMAL HIGH (ref <150)

## 2021-05-28 LAB — MAGNESIUM: Magnesium: 2.6 mg/dL — ABNORMAL HIGH (ref 1.7–2.4)

## 2021-05-28 LAB — GLUCOSE, CAPILLARY
Glucose-Capillary: 134 mg/dL — ABNORMAL HIGH (ref 70–99)
Glucose-Capillary: 105 mg/dL — ABNORMAL HIGH (ref 70–99)

## 2021-05-28 MED ORDER — SODIUM CHLORIDE 0.9 % IV SOLN
1.0000 mg/h | INTRAVENOUS | Status: DC
  Administered 2021-05-28: 2 mg/h via INTRAVENOUS
  Filled 2021-05-28: qty 5

## 2021-05-28 MED ORDER — FENTANYL CITRATE (PF) 2500 MCG/50ML IJ SOLN
0.0000 ug/h | Status: DC
  Filled 2021-05-28: qty 100

## 2021-05-28 MED ORDER — HALOPERIDOL LACTATE 5 MG/ML IJ SOLN: 0.5000 mg | INTRAMUSCULAR | Status: DC | PRN

## 2021-05-28 MED ORDER — HYDROMORPHONE BOLUS VIA INFUSION
1.0000 mg | INTRAVENOUS | Status: DC | PRN
  Administered 2021-05-28: 1 mg via INTRAVENOUS
  Filled 2021-05-28: qty 1

## 2021-05-28 MED ORDER — GLYCOPYRROLATE 0.2 MG/ML IJ SOLN: 0.2000 mg | INTRAMUSCULAR | Status: DC | PRN

## 2021-05-28 MED ORDER — FENTANYL BOLUS VIA INFUSION
100.0000 ug | INTRAVENOUS | Status: DC | PRN
  Filled 2021-05-28: qty 100

## 2021-05-28 MED ORDER — BIOTENE DRY MOUTH MT LIQD: 15.0000 mL | OROMUCOSAL | Status: DC | PRN

## 2021-05-28 MED ORDER — FENTANYL 2500MCG IN NS 250ML (10MCG/ML) PREMIX INFUSION: 0.0000 ug/h | INTRAVENOUS | Status: DC

## 2021-05-28 MED ORDER — HALOPERIDOL 0.5 MG PO TABS
0.5000 mg | ORAL_TABLET | ORAL | Status: DC | PRN
  Filled 2021-05-28: qty 1

## 2021-05-28 MED ORDER — POLYVINYL ALCOHOL 1.4 % OP SOLN
1.0000 [drp] | Freq: Four times a day (QID) | OPHTHALMIC | Status: DC | PRN
  Filled 2021-05-28: qty 15

## 2021-05-28 MED ORDER — GLYCOPYRROLATE 1 MG PO TABS: 1.0000 mg | ORAL_TABLET | ORAL | Status: DC | PRN

## 2021-05-28 MED ORDER — HALOPERIDOL LACTATE 2 MG/ML PO CONC
0.5000 mg | ORAL | Status: DC | PRN
  Filled 2021-05-28: qty 0.3

## 2021-06-18 NOTE — Progress Notes (Signed)
Pt compassionately extubated per MD order. Family at bedside. 

## 2021-06-18 NOTE — Death Summary Note (Signed)
DEATH SUMMARY   Patient Details  Name: Brent Yu MRN: [id number] DOB: Feb 22, 1933  Admission/Discharge Information   Admit Date:  05-30-21  Date of Death: Date of Death: 2021/06/11  Time of Death: Time of Death: 10/09/19  Length of Stay: 2022-10-12  Referring Physician: Orpah Melter, MD   Reason(s) for Hospitalization  - Aspiration pneumonia - Acute renal failure - Septic shock - Frail elderly - DNR - Hx SIADH  Brief Hospital Course (including significant findings, care, treatment, and services provided and events leading to death)  88 year old male who was in his usual state of good health approximately 3 days ago his wife who is a Designer, jewellery noted jerky movements while walking and talking.  The plan was to seek medical attention today May 30, 2021 but he fell out of bed and injured his head is brought to [facility] emergency department as a trauma patient.  After evaluation by the trauma team he is noted to have elevated white count, bilateral airspace disease on CT scan and became hypotensive and more of a sepsis picture than a trauma picture.  Pulmonary critical care was asked to take over and admit.  He has an extensive medical history but most importantly has a syndrome of inappropriate ADH that is controlled with salt tablets and fluid restriction.  He is able to drive and perform all activities of daily living without problems prior to this admission  Intubated on admission and started on vasopressors.  Imaging c/w aspiration pneumonia.  Developed renal failure during stay.  Had initial improvement then had another aspiration event on ventilator and developed progressive severe shock as well as persistent comatose state.  After discussion with family regarding GOC family elected to allow him to pass in peace.  Pertinent Labs and Studies  Significant Diagnostic Studies DG Chest 1 View  Result Date: 06-11-2021 CLINICAL DATA:  Respiratory failure.  Endotracheal tube placement.  EXAM: CHEST  1 VIEW COMPARISON:  05/24/2021 FINDINGS: Endotracheal tube has tip approximately 5.5 cm above the carina. Nasogastric tube courses into the region of the stomach and off the film as tip is not visualized. Right-sided PICC line has tip over the SVC. Interval removal of right IJ central venous catheter. Lungs are adequately inflated demonstrate patchy bilateral airspace opacification slight interval improved aeration over the left base and right upper lung. No significant effusion. Cardiomediastinal silhouette and remainder of the exam is unchanged. IMPRESSION: 1. Persistent patchy bilateral airspace process with slight interval improved aeration over the left base and right upper lung. Likely multifocal infection. 2. Tubes and lines as described. Electronically Signed   By: Marin Olp M.D.   On: 06-11-21 08:35   DG Abd 1 View  Result Date: 05/30/2021 CLINICAL DATA:  Enteric tube placement. EXAM: ABDOMEN - 1 VIEW COMPARISON:  None. FINDINGS: NG tube tip and side port project over the expected area of the stomach. Partially visualized lung bases with bilateral heterogeneous pulmonary opacities. Visualized abdomen is unremarkable. IMPRESSION: NG tube tip and side port project over the expected area of the stomach. Electronically Signed   By: Yetta Glassman M.D.   On: 05/30/2021 10:41   CT HEAD WO CONTRAST (5MM)  Result Date: 2021/05/30 CLINICAL DATA:  Fall. EXAM: CT HEAD WITHOUT CONTRAST CT CERVICAL SPINE WITHOUT CONTRAST TECHNIQUE: Multidetector CT imaging of the head and cervical spine was performed following the standard protocol without intravenous contrast. Multiplanar CT image reconstructions of the cervical spine were also generated. COMPARISON:  07/10/2007 head CT. FINDINGS: CT HEAD FINDINGS  Brain: There is no evidence for acute hemorrhage, hydrocephalus, mass lesion, or abnormal extra-axial fluid collection. No definite CT evidence for acute infarction. Diffuse loss of parenchymal  volume is consistent with atrophy. Patchy low attenuation in the deep hemispheric and periventricular white matter is nonspecific, but likely reflects chronic microvascular ischemic demyelination. Vascular: No hyperdense vessel or unexpected calcification. Skull: No evidence for fracture. No worrisome lytic or sclerotic lesion. Sinuses/Orbits: The visualized paranasal sinuses and mastoid air cells are clear. Visualized portions of the globes and intraorbital fat are unremarkable. Other: None. CT CERVICAL SPINE FINDINGS Alignment: Trace anterolisthesis of C7 on T1 is likely related to the loss of facet space at the same level. Preservation of cervical lordosis. Skull base and vertebrae: No acute fracture. No primary bone lesion or focal pathologic process. Soft tissues and spinal canal: No prevertebral fluid or swelling. No visible canal hematoma. Disc levels: Loss of disc height noted at all levels from C3-4 down to C7-T1. Right greater than left facet osteoarthritis associated. Upper chest: Patchy airspace disease noted right apex. No evidence for apical pneumothorax. Other: Endotracheal and NG tubes evident. Trace venous gas in the upper chest likely related to IV placement. IMPRESSION: 1. No acute intracranial abnormality. Atrophy with chronic small vessel white matter ischemic disease. 2. Degenerative changes in the cervical spine without evidence for an acute fracture. 3. Patchy airspace disease in the right apex. Electronically Signed   By: Kennith Center M.D.   On: 05-23-2021 07:03   CT CERVICAL SPINE WO CONTRAST  Result Date: 2021/05/23 CLINICAL DATA:  Fall. EXAM: CT HEAD WITHOUT CONTRAST CT CERVICAL SPINE WITHOUT CONTRAST TECHNIQUE: Multidetector CT imaging of the head and cervical spine was performed following the standard protocol without intravenous contrast. Multiplanar CT image reconstructions of the cervical spine were also generated. COMPARISON:  07/10/2007 head CT. FINDINGS: CT HEAD FINDINGS  Brain: There is no evidence for acute hemorrhage, hydrocephalus, mass lesion, or abnormal extra-axial fluid collection. No definite CT evidence for acute infarction. Diffuse loss of parenchymal volume is consistent with atrophy. Patchy low attenuation in the deep hemispheric and periventricular white matter is nonspecific, but likely reflects chronic microvascular ischemic demyelination. Vascular: No hyperdense vessel or unexpected calcification. Skull: No evidence for fracture. No worrisome lytic or sclerotic lesion. Sinuses/Orbits: The visualized paranasal sinuses and mastoid air cells are clear. Visualized portions of the globes and intraorbital fat are unremarkable. Other: None. CT CERVICAL SPINE FINDINGS Alignment: Trace anterolisthesis of C7 on T1 is likely related to the loss of facet space at the same level. Preservation of cervical lordosis. Skull base and vertebrae: No acute fracture. No primary bone lesion or focal pathologic process. Soft tissues and spinal canal: No prevertebral fluid or swelling. No visible canal hematoma. Disc levels: Loss of disc height noted at all levels from C3-4 down to C7-T1. Right greater than left facet osteoarthritis associated. Upper chest: Patchy airspace disease noted right apex. No evidence for apical pneumothorax. Other: Endotracheal and NG tubes evident. Trace venous gas in the upper chest likely related to IV placement. IMPRESSION: 1. No acute intracranial abnormality. Atrophy with chronic small vessel white matter ischemic disease. 2. Degenerative changes in the cervical spine without evidence for an acute fracture. 3. Patchy airspace disease in the right apex. Electronically Signed   By: Kennith Center M.D.   On: 05-23-21 07:03   US RENAL  Result Date: 05/24/2021 CLINICAL DATA:  Acute kidney injury. EXAM: RENAL / URINARY TRACT ULTRASOUND COMPLETE COMPARISON:  CT chest abdomen and pelvis  04/18/2021. FINDINGS: Right Kidney: Renal measurements: 10.3 x 5.4 x 3.9 cm  = volume: 113 mL. Echogenicity within normal limits. No mass or hydronephrosis visualized. Left Kidney: Renal measurements: 10.8 by 5.2 x 4.7 cm = volume: 138 mL. Echogenicity within normal limits. No mass or hydronephrosis visualized. Bladder: Not seen. Other: Sonographer notes left upper quadrant structure containing mobile debris, possibly dilated stomach. IMPRESSION: 1. Kidneys appear within normal limits. 2. Bladder not well seen. 3. Indeterminate left upper quadrant structure containing mobile debris, possibly dilated stomach. Please correlate clinically. Electronically Signed   By: Ronney Asters M.D.   On: 05/24/2021 20:41   DG Pelvis Portable  Result Date: 04/25/2021 CLINICAL DATA:  Fall.  Level 1 trauma. EXAM: PORTABLE PELVIS 1-2 VIEWS COMPARISON:  None. FINDINGS: Single AP portable U of the lower pelvis obtained at 0638 hours shows no evidence for an acute fracture. Visualized SI joints in the symphysis pubis are unremarkable. Right femoral vascular catheter evident. IMPRESSION: Negative. Electronically Signed   By: Misty Stanley M.D.   On: 04/23/2021 07:32   CT CHEST ABDOMEN PELVIS W CONTRAST  Result Date: 04/28/2021 CLINICAL DATA:  Abdominal trauma. EXAM: CT CHEST, ABDOMEN, AND PELVIS WITH CONTRAST TECHNIQUE: Multidetector CT imaging of the chest, abdomen and pelvis was performed following the standard protocol during bolus administration of intravenous contrast. CONTRAST:  11ml of Omni350 COMPARISON:  Chest CT 04/29/2009.  Abdomen CT 11/05/2009. FINDINGS: CT CHEST FINDINGS Cardiovascular: The heart size is normal. No substantial pericardial effusion. Coronary artery calcification is evident. Mild atherosclerotic calcification is noted in the wall of the thoracic aorta. Mediastinum/Nodes: Endotracheal tube tip noted distal esophagus. NG tube tip is in the stomach. No mediastinal lymphadenopathy. There is no hilar lymphadenopathy. The esophagus has normal imaging features. There is no axillary  lymphadenopathy. Lungs/Pleura: Dense airspace consolidation is identified in both lower lobes with patchy ground-glass and consolidative opacity in the right upper lobe and lingula. No pneumothorax. No pleural effusion. Musculoskeletal: No worrisome lytic or sclerotic osseous abnormality. Small sclerotic focus in the T5 vertebral body is likely a bone island. CT ABDOMEN PELVIS FINDINGS Hepatobiliary: No suspicious focal abnormality within the liver parenchyma. Gallbladder is distended with layering tiny calcified gallstones evident. No intrahepatic or extrahepatic biliary dilation. Pancreas: No focal mass lesion. No dilatation of the main duct. No intraparenchymal cyst. No peripancreatic edema. Spleen: No splenomegaly. No focal mass lesion. Adrenals/Urinary Tract: No adrenal nodule or mass. Tiny cyst noted upper pole left kidney. Right kidney unremarkable. No evidence for hydroureter. The urinary bladder appears normal for the degree of distention. Stomach/Bowel: Stomach is unremarkable. No gastric wall thickening. No evidence of outlet obstruction. Duodenum is normally positioned as is the ligament of Treitz. No small bowel wall thickening. No small bowel dilatation. The terminal ileum is normal. The appendix is normal. No gross colonic mass. No colonic wall thickening. Diverticular changes are noted in the left colon without evidence of diverticulitis. Vascular/Lymphatic: There is moderate atherosclerotic calcification of the abdominal aorta without aneurysm. There is no gastrohepatic or hepatoduodenal ligament lymphadenopathy. No retroperitoneal or mesenteric lymphadenopathy. No pelvic sidewall lymphadenopathy. Right-sided common femoral venous catheter tip is positioned in the inferior right common femoral vein. Reproductive: The prostate gland and seminal vesicles are unremarkable. Other: No intraperitoneal free fluid. Musculoskeletal: No worrisome lytic or sclerotic osseous abnormality. Small sclerotic foci in  the left sacrum and left iliac bone are compatible with bone islands. IMPRESSION: 1. Dense airspace consolidation in both lower lobes with patchy ground-glass and consolidative opacity in the  right upper lobe and lingula. Imaging features compatible with multifocal pneumonia. No pneumothorax or pleural effusion. 2. No evidence for acute traumatic organ injury in the abdomen or pelvis. No intraperitoneal free fluid. 3. Cholelithiasis. 4. Left colonic diverticulosis without diverticulitis. 5. Aortic Atherosclerosis (ICD10-I70.0). Results were called by me at the time of interpretation on 04/25/2021 at 7:06 am to Dr. Bobbye Morton. Electronically Signed   By: Misty Stanley M.D.   On: 05/17/2021 07:17   DG CHEST PORT 1 VIEW  Result Date: 05/24/2021 CLINICAL DATA:  Central line placement EXAM: PORTABLE CHEST 1 VIEW COMPARISON:  05/23/2021 FINDINGS: Right IJ temporary dialysis catheter tip proximal to mid SVC level. Endotracheal tube in the mid trachea well above the carina. NG tube enters the stomach with the tip not visualized. Right PICC line tip SVC RA junction. Right hemidiaphragm remains elevated. Bilateral patchy airspace opacities, most pronounced in the right upper lobe and left lower lung. Overall, little interval change in aeration. No large effusion or pneumothorax. IMPRESSION: Right IJ temporary dialysis catheter tip proximal to mid SVC level. Other support apparatus stable. Similar bilateral airspace opacities. Electronically Signed   By: Jerilynn Mages.  Shick M.D.   On: 05/24/2021 15:52   DG CHEST PORT 1 VIEW  Result Date: 05/23/2021 CLINICAL DATA:  Respiratory failure.  Ventilator support. EXAM: PORTABLE CHEST 1 VIEW COMPARISON:  05/20/2021 FINDINGS: Endotracheal tube tip is 5 cm above the carina. Orogastric or nasogastric tube enters the stomach. Right arm PICC tip is at the SVC RA junction. Pulmonary infiltrate and volume loss in the right mid lung and left lower lobe persists but may be becoming less dense.  IMPRESSION: Lines and tubes satisfactory. Persistent bilateral pulmonary infiltrates consistent with pneumonia. Question if the infiltrates are becoming less dense. Electronically Signed   By: Nelson Chimes M.D.   On: 05/23/2021 08:39   DG CHEST PORT 1 VIEW  Result Date: 05/20/2021 CLINICAL DATA:  Hypoxemia, sepsis, worsening shock. Acute respiratory failure. Encephalopathy. EXAM: PORTABLE CHEST 1 VIEW COMPARISON:  05/18/2021 FINDINGS: The endotracheal tube is 3 cm above the carina, satisfactorily position. Nasogastric tube enters the stomach. Right PICC line tip: Upper right atrium. Low lung volumes are present, causing crowding of the pulmonary vasculature. Mildly improved aeration at the left lung base although dense consolidation remains. Slightly worsened airspace opacity in the right mid lung with increased confluence of hazy opacities. Heart size within normal limits. Atherosclerotic calcification of the aortic arch. Borderline elevated right hemidiaphragm. IMPRESSION: 1. Mildly improved aeration at the left lung base but worsened aeration in the right mid lung. Appearance favors multifocal pneumonia or aspiration pneumonitis. 2. Tubes and lines appear satisfactorily position. 3. Low lung volumes. Electronically Signed   By: Van Clines M.D.   On: 05/20/2021 13:25   DG CHEST PORT 1 VIEW  Result Date: 05/18/2021 CLINICAL DATA:  88 year old male with history of abnormal respirations. EXAM: PORTABLE CHEST 1 VIEW COMPARISON:  Chest x-ray 04/25/2021. FINDINGS: An endotracheal tube is in place with tip 4.4 cm above the carina. There is a right upper extremity PICC with tip terminating in the superior cavoatrial junction. A nasogastric tube is seen extending into the stomach, however, the tip of the nasogastric tube extends below the lower margin of the image. Lung volumes are low. Patchy interstitial and airspace disease noted throughout the lungs bilaterally, most evident in the mid to lower lungs  where there is also extensive subsegmental atelectasis. Aeration overall appears unchanged. Possible small left pleural effusion. No definite right pleural effusion.  No pneumothorax. No definite evidence of pulmonary edema. Heart size is normal. Upper mediastinal contours are within normal limits. Atherosclerotic calcifications in the thoracic aorta. IMPRESSION: 1. Support apparatus, as above. 2. Overall, allowing for differences in patient positioning, the radiographic appearance of the chest is essentially unchanged, as above. Electronically Signed   By: Vinnie Langton M.D.   On: 05/18/2021 07:09   DG Chest Port 1 View  Result Date: 05/02/2021 CLINICAL DATA:  Sepsis. EXAM: PORTABLE CHEST 1 VIEW COMPARISON:  None. FINDINGS: 0636 hours. Endotracheal tube tip is 4.9 cm above the base of the carina. The NG tube passes into the stomach although the distal tip position is not included on the film. The cardio pericardial silhouette is enlarged. Bibasilar collapse/consolidation noted with patchy airspace disease in the right mid lung. Bones are diffusely demineralized. Telemetry leads overlie the chest. IMPRESSION: 1. Endotracheal tube tip is 4.9 cm above the base of the carina. 2. Bibasilar collapse/consolidation with patchy airspace disease in the right mid lung. Electronically Signed   By: Misty Stanley M.D.   On: 04/18/2021 07:31   EEG adult  Result Date: 04/28/2021 Lora Havens, MD     05/17/2021  6:00 PM Patient Name: ARIB ULCH MRN: [id number] Epilepsy Attending: Lora Havens Referring Physician/Provider: Laurin Coder, MD Date: 04/26/2021 Duration: 24.08 mins Patient history: 88yo M with AMS and seizure like activity. EEG to evaluate for seizure Level of alertness:  comatose AEDs during EEG study: None Technical aspects: This EEG study was done with scalp electrodes positioned according to the 10-20 International system of electrode placement. Electrical activity was acquired at a  sampling rate of 500Hz  and reviewed with a high frequency filter of 70Hz  and a low frequency filter of 1Hz . EEG data were recorded continuously and digitally stored. Description: EEG showed continuous generalized 3 to 6 Hz theta-delta slowing. Hyperventilation and photic stimulation were not performed.   ABNORMALITY - Continuous slow, generalized IMPRESSION: This study is suggestive of severe diffuse encephalopathy, nonspecific etiology. No seizures or epileptiform discharges were seen throughout the recording. Lora Havens   ECHOCARDIOGRAM COMPLETE  Result Date: 05/17/2021    ECHOCARDIOGRAM REPORT   Patient Name:   TERREL DRISKEL Date of Exam: 05/17/2021 Medical Rec #:  [id number]      Height:       69.0 in Accession #:    [id number]     Weight:       167.8 lb Date of Birth:  18-Jun-1933      BSA:          1.917 m Patient Age:    4 years       BP:           104/53 mmHg Patient Gender: M              HR:           73 bpm. Exam Location:  Inpatient Procedure: 2D Echo, Cardiac Doppler and Color Doppler Indications:    Elevated Troponin  History:        Patient has no prior history of Echocardiogram examinations.  Sonographer:    Bernadene Person RDCS Referring Phys: [id number] [place] Comments: Echo performed with patient supine and on artificial respirator. IMPRESSIONS  1. Left ventricular ejection fraction, by estimation, is 55 to 60%. The left ventricle has normal function. The left ventricle has no regional wall motion abnormalities. There is mild concentric left ventricular hypertrophy. Left ventricular diastolic parameters  were normal.  2. Right ventricular systolic function is normal. The right ventricular size is normal. There is normal pulmonary artery systolic pressure.  3. The mitral valve is normal in structure. Mild mitral valve regurgitation. No evidence of mitral stenosis.  4. The aortic valve is normal in structure. Aortic valve regurgitation is not visualized. Aortic valve  sclerosis is present, with no evidence of aortic valve stenosis.  5. The inferior vena cava is normal in size with greater than 50% respiratory variability, suggesting right atrial pressure of 3 mmHg. Comparison(s): No prior Echocardiogram. FINDINGS  Left Ventricle: Left ventricular ejection fraction, by estimation, is 55 to 60%. The left ventricle has normal function. The left ventricle has no regional wall motion abnormalities. The left ventricular internal cavity size was normal in size. There is  mild concentric left ventricular hypertrophy. Left ventricular diastolic parameters were normal. Right Ventricle: The right ventricular size is normal. No increase in right ventricular wall thickness. Right ventricular systolic function is normal. There is normal pulmonary artery systolic pressure. The tricuspid regurgitant velocity is 2.12 m/s, and  with an assumed right atrial pressure of 8 mmHg, the estimated right ventricular systolic pressure is [id number] mmHg. Left Atrium: Left atrial size was normal in size. Right Atrium: Right atrial size was normal in size. Pericardium: There is no evidence of pericardial effusion. Mitral Valve: The mitral valve is normal in structure. Mild mitral valve regurgitation. No evidence of mitral valve stenosis. Tricuspid Valve: The tricuspid valve is normal in structure. Tricuspid valve regurgitation is not demonstrated. No evidence of tricuspid stenosis. Aortic Valve: The aortic valve is normal in structure. Aortic valve regurgitation is not visualized. Aortic valve sclerosis is present, with no evidence of aortic valve stenosis. Pulmonic Valve: The pulmonic valve was normal in structure. Pulmonic valve regurgitation is trivial. No evidence of pulmonic stenosis. Aorta: The aortic root is normal in size and structure. Venous: The inferior vena cava is normal in size with greater than 50% respiratory variability, suggesting right atrial pressure of 3 mmHg. IAS/Shunts: No atrial level shunt  detected by color flow Doppler.  LEFT VENTRICLE PLAX 2D LVIDd:         4.50 cm   Diastology LVIDs:         3.00 cm   LV e' medial:    5.43 cm/s LV PW:         2.10 cm   LV E/e' medial:  11.4 LV IVS:        1.10 cm   LV e' lateral:   7.87 cm/s LVOT diam:     2.20 cm   LV E/e' lateral: 7.8 LV SV:         88 LV SV Index:   46 LVOT Area:     3.80 cm  RIGHT VENTRICLE RV S prime:     12.80 cm/s TAPSE (M-mode): 1.5 cm LEFT ATRIUM             Index        RIGHT ATRIUM           Index LA diam:        3.40 cm 1.77 cm/m   RA Area:     11.30 cm LA Vol (A2C):   32.7 ml 17.05 ml/m  RA Volume:   21.40 ml  11.16 ml/m LA Vol (A4C):   32.0 ml 16.69 ml/m LA Biplane Vol: 33.8 ml 17.63 ml/m  AORTIC VALVE LVOT Vmax:   116.00 cm/s LVOT Vmean:  83.400 cm/s LVOT  VTI:    0.231 m  AORTA Ao Root diam: 3.60 cm MITRAL VALVE               TRICUSPID VALVE MV Area (PHT): 2.69 cm    TR Peak grad:   18.0 mmHg MV Decel Time: 282 msec    TR Vmax:        212.00 cm/s MV E velocity: 61.70 cm/s MV A velocity: 74.10 cm/s  SHUNTS MV E/A ratio:  0.83        Systemic VTI:  0.23 m                            Systemic Diam: 2.20 cm Kardie Tobb DO Electronically signed by Berniece Salines DO Signature Date/Time: 05/17/2021/2:28:06 PM    Final    [place] EKG SITE RITE  Result Date: 05/17/2021 If Site Rite image not attached, placement could not be confirmed due to current cardiac rhythm.   Microbiology Recent Results (from the past 240 hour(s))  Culture, Respiratory w Gram Stain     Status: None   Collection Time: 05/24/21  3:27 PM   Specimen: Tracheal Aspirate; Respiratory  Result Value Ref Range Status   Specimen Description TRACHEAL ASPIRATE  Final   Special Requests NONE  Final   Gram Stain   Final    FEW WBC PRESENT,BOTH PMN AND MONONUCLEAR FEW GRAM POSITIVE COCCI IN PAIRS RARE GRAM VARIABLE ROD Performed at [facility], [place] [address] [place], [place] [address]    Culture ABUNDANT STAPHYLOCOCCUS AUREUS  Final   Report Status  05/26/2021 FINAL  Final   Organism ID, Bacteria STAPHYLOCOCCUS AUREUS  Final      Susceptibility   Staphylococcus aureus - MIC*    CIPROFLOXACIN 1 SENSITIVE Sensitive     ERYTHROMYCIN >=8 RESISTANT Resistant     GENTAMICIN <=0.5 SENSITIVE Sensitive     OXACILLIN 0.5 SENSITIVE Sensitive     TETRACYCLINE <=1 SENSITIVE Sensitive     VANCOMYCIN 1 SENSITIVE Sensitive     TRIMETH/SULFA <=10 SENSITIVE Sensitive     CLINDAMYCIN RESISTANT Resistant     RIFAMPIN <=0.5 SENSITIVE Sensitive     Inducible Clindamycin POSITIVE Resistant     * ABUNDANT STAPHYLOCOCCUS AUREUS  MRSA Next Gen by PCR, Nasal     Status: None   Collection Time: 05/25/21 10:20 AM   Specimen: Nasal Mucosa; Nasal Swab  Result Value Ref Range Status   MRSA by PCR Next Gen NOT DETECTED NOT DETECTED Final    Comment: (NOTE) The GeneXpert MRSA Assay (FDA approved for NASAL specimens only), is one component of a comprehensive MRSA colonization surveillance program. It is not intended to diagnose MRSA infection nor to guide or monitor treatment for MRSA infections. Test performance is not FDA approved in patients less than 62 years old. Performed at [facility], [place] [address] [place], [place] [address]     Lab Basic Metabolic Panel: Recent Labs  Lab 05/24/21 [phone] 05/24/21 [id number] 05/25/21 0418 05/25/21 1558 05/26/21 0539 05/26/21 0822 05/26/21 1722 05/27/21 0340 05/27/21 0914 05/27/21 1516 2021-06-16 0317  NA 144   < > 136   136   < > 137   135   < > 135 134*   134* 135 135 135  K 5.1   < > 5.6*   5.7*   < > 5.6*   5.5*   < > 5.9* 4.6   4.6 4.2 4.3 3.7  CL 110   < > 102   102   < > 94*   92*  --  100 99   99  --  100 102  CO2 22   < > 22   22   < > 24   24  --  21* 21*   21*  --  22 19*  GLUCOSE 132*   < > 209*   224*   < > 138*   138*  --  131* 144*   143*  --  133* 137*  BUN 111*   < > 85*   86*   < > 57*   57*  --  65* 51*   55*  --  53* 56*  CREATININE 4.03*   < > 3.68*   3.76*   < > 2.55*   2.58*  --   2.66* 1.99*   2.00*  --  1.77* 1.80*  CALCIUM 7.9*   < > 7.7*   7.7*   < > 7.5*   7.6*  --  7.5* 7.6*   7.6*  --  7.7* 7.7*  MG 2.1  --  2.2  --  2.9*  --   --  2.8*  --   --  2.6*  PHOS 4.7*   < > 5.4*   5.4*   < > 6.4*   6.5*  --  8.6* 7.4*   7.4*  --  6.2* 5.8*   < > = values in this interval not displayed.   Liver Function Tests: Recent Labs  Lab 05/26/21 0539 05/26/21 1722 05/27/21 0340 05/27/21 1516 Jun 02, 2021 0317  ALBUMIN <1.5* <1.5* <1.5* <1.5* <1.5*   No results for input(s): LIPASE, AMYLASE in the last 168 hours. No results for input(s): AMMONIA in the last 168 hours. CBC: Recent Labs  Lab 05/24/21 0329 05/24/21 0956 05/25/21 0418 05/26/21 0539 05/26/21 0822 05/26/21 1010 05/26/21 1132 05/27/21 0340 05/27/21 0914 02-Jun-2021 0317  WBC 18.9*  --  31.0* 33.1*  --   --   --  21.8*  --  17.5*  HGB 9.1*   < > 9.8* 10.4*   < > 10.9* 10.9* 9.6* 9.9* 8.7*  HCT 27.3*   < > 29.1* 31.4*   < > 32.0* 32.0* 29.7* 29.0* 26.3*  MCV 96.1  --  96.0 96.6  --   --   --  96.1  --  94.9  PLT 155  --  208 232  --   --   --  220  --  183   < > = values in this interval not displayed.   Cardiac Enzymes: No results for input(s): CKTOTAL, CKMB, CKMBINDEX, TROPONINI in the last 168 hours. Sepsis Labs: Recent Labs  Lab 05/25/21 0418 05/26/21 0539 05/26/21 0821 05/27/21 0340 06/02/21 0317  WBC 31.0* 33.1*  --  21.8* 17.5*  LATICACIDVEN  --   --  1.6  --   --      Candee Furbish 05/30/2021, 6:02 PM

## 2021-06-18 NOTE — Progress Notes (Signed)
Re attempted ween. Patient breathing comfortably, wife at bedside. Both I and wife agree patient is ready for extubation.

## 2021-06-18 NOTE — Progress Notes (Signed)
Unused fentanyl bag walked back to pharmacy.

## 2021-06-18 NOTE — Progress Notes (Signed)
75 mL Versed and 75 mL Fentanyl wasted in Stericycle with Karrie Doffing RN

## 2021-06-18 NOTE — Progress Notes (Signed)
Palliative Medicine Inpatient Follow Up Note  Consulting Provider: Harvie Heck, MD   Reason for consult:   [facility] Consult  Reason for Consult? Goals of care    HPI:  Per intake H&P --> Brent Yu is a 88 y.o. M who began having jerky movements ~11/26 while walking and talking. Originally he and his wife had agreed to seek medical attention for these symptoms 11/29 however he fell out of bed and struck his head after waking early 11/29. He was brought to [facility] who reported an initial BP of 50/30, hypoxia in the 70's on room air, AMS, and evidence of head trauma. He received epi en route and given 400 cc NS bolus to [facility] ED as a trauma patient. He was assessed and cleared from a trauma standpoint however he was found to have leukocytosis, bilateral airspace disease on CT, and hypotension.  He was intubated and central line was placed.    Brent Yu has remained intubated and sedated over the past eight days. His clinical course has been fluctuant though overall he is not making great improvement. The Palliative care team was asked to further discuss goals of care with the patients family.   Today's Discussion (2021/06/22):  *Please note that this is a verbal dictation therefore any spelling or grammatical errors are due to the "[place]" system interpretation.  Chart reviewed.   I was informed by patients bedside RN, Brent Yu that Brent Yu's family has decided to stop CRRT this morning. He shares that they would like to speak with me.  I went to bedside, family was singing and praying. Patients three children, spouse, and [name] were present. After their song I was able to speak to the patients family.  We reviewed the process of comfort care. We talked about transition to comfort measures in house and what that would entail inclusive of medications to control pain, dyspnea, agitation, nausea, itching, and hiccups.  We discussed stopping all  uneccessary measures such as mechanical ventilation, cardiac monitoring, blood draws, needle sticks, and frequent vital signs.   I met with [name] after placing the orders for comfort care. We lamented on her life with Brent Yu and how special the last 62 years have been. We reviewed that Brent Yu was a wonderful spouse and provider. She shares the experience that they have had at [facility] which has been fantastic in terms of care.  Patients RN, Brent Yu and Critical Care MD, Dr. Tamala Julian aware of the transition towards comfort this morning.   Palliative support provided.  Objective Assessment: Vital Signs Vitals:   22-Jun-2021 0800 06-22-2021 0815  BP:    Pulse: (!) 51 (!) 52  Resp: (!) 30 (!) 30  Temp:    SpO2: 100% 100%    Intake/Output Summary (Last 24 hours) at 2021/06/22 2482 Last data filed at 06/22/2021 0800 Gross per 24 hour  Intake 1341.58 ml  Output 1528 ml  Net -186.42 ml    Last Weight  Most recent update: June 22, 2021  5:04 AM    Weight  76 kg (167 lb 8.8 oz)            Gen:  Critically ill Caucasian elderly M HEENT: Intubated, (+) OGT CV: Irregular rate and rhythm PULM: Mechanical ventilator ABD: Soft EXT: Generalized edema Neuro: Somnolence  SUMMARY OF RECOMMENDATIONS   DNAR  Plan for compassionate extubation this morning   Comfort Care  Will initiate fentanyl gtt / dilaudid gtt  Spiritual support is provided  through patients personal [name]   Ongoing palliative care  Time Spent: 57 minutes Greater than 50% of the time was spent in counseling and coordination of care ______________________________________________________________________________________ [place] Team Team Cell Phone: [phone] Please utilize secure chat with additional questions, if there is no response within 30 minutes please call the above phone number  Palliative Medicine Team providers are available by phone from 7am to 7pm daily and can  be reached through the team cell phone.  Should this patient require assistance outside of these hours, please call the patient's attending physician.

## 2021-06-18 NOTE — Progress Notes (Signed)
Changed vent setting to ween, patient developed tachypnea and grimicing. Dilaudid started and bolus given for comfort. Will retry after a few minutes.

## 2021-06-18 NOTE — Progress Notes (Signed)
NAME:  Brent Yu, MRN:  [id number], DOB:  [birth year], LOS: 12 ADMISSION DATE:  06/14/2021, CONSULTATION DATE:  11/29 REFERRING MD:  Janee Morn, CHIEF COMPLAINT:  resp failure, sepsis, AMS   History of Present Illness:  Brent Yu is a 88 y.o. M who began having jerky movements ~11/26 while walking and talking. Originally he and his wife had agreed to seek medical attention for these symptoms 11/29 however he fell out of bed and struck his head after waking early 11/29. He was brought to [facility] who reported an initial BP of 50/30, hypoxia in the 70's on room air, AMS, and evidence of head trauma. He received epi en route and given 400 cc NS bolus to [facility] ED as a trauma patient. He was assessed and cleared from a trauma standpoint however he was found to have leukocytosis, bilateral airspace disease on CT, and hypotension.  He was intubated and central line was placed. PCCM was consulted for further management  Pertinent  Medical History  Gilbert's syndrome, HTN, HLD, hypothyroidism, SIADH, and urinary incontinence   Significant Hospital Events: Including procedures, antibiotic start and stop dates in addition to other pertinent events   2021-06-14 Intubated 2021/06/14 Started on vasopressors 2021-06-14 CT of the head neck negative, CT of chest with bilateral airspace disease 05/18/2021 Added vasopressin for worsening shock 05/19/2021 Improving pressor requirement  12/5 Increasing levo and vaso requirements overnight and sedation 12/6 Worsening kidney function, consulted nephro and palliative care 12/7 Started on CRRT overnight  12/8 Increasing mech ventilation requirements, had episodes of vomiting overnight. Remains encephalopathic despite weaning sedation  12/9 worsening mental status, family meeting; HD catheter exchanged  Interim History / Subjective:  Remains poorly responsive on vent, off pressors.  Objective   Blood pressure (!) 111/54, pulse 66, temperature (!) 96.9 F (36.1  C), temperature source Axillary, resp. rate 13, height 5\' 9"  (1.753 m), weight 76 kg, SpO2 100 %.    Vent Mode: PRVC FiO2 (%):  [80 %-100 %] 80 % Set Rate:  [31 bmp] 31 bmp Vt Set:  [550 mL] 550 mL PEEP:  [12 cmH20] 12 cmH20 Plateau Pressure:  [24 cmH20-35 cmH20] 24 cmH20   Intake/Output Summary (Last 24 hours) at 05/30/2021 0647 Last data filed at 06/10/2021 0600 Gross per 24 hour  Intake 1687.92 ml  Output 1655 ml  Net 32.92 ml    Filed Weights   05/26/21 0500 05/27/21 0429 06/04/2021 0500  Weight: 76.2 kg 74.6 kg 76 kg   Examination: Constitutional: ill appearing unresponsive man on vent  Eyes: dilated but reactive pupils, symmetric, not tracking Ears, nose, mouth, and throat: ETT in place, small thick secretions, R neck swollen at site of vascath removal Cardiovascular: tachycardic, ext warm Respiratory: Scattered rhonci, triggering vent Gastrointestinal: more firm today with less Skin: No rashes, normal turgor Neurologic: cranial nerves are intact and symmetric, blinks to threat, I cannot get him to withdraw to pain today Psychiatric: cannot assess  BMP looks good WBC improved H/H stable  Resolved Hospital Problem list     Assessment & Plan:  Acute hypoxic respiratory failure recurrent Influenza E coli and H influenzae tx Persistent MSSA in sputum Question of aspiration leading to latest setback Septic shock 2/2 PNA - Continue vent support, VAP prevention bundle - CTX x 5 more days - LTVV as able - DC stress steroids   Acute renal failure secondary to septic ATN Acute encephalopathy, ongoing issue during stay Afib with RVR > in sinus rhythm now - Continues  on trial of CRRT - Continue amio PO - As was reactive, no indicaiton for full AC at this time   DNR, GOC- likely transition to comfort care today, appreciate palliative help  Best Practice (right click and "Reselect all SmartList Selections" daily)   Diet/type: tubefeeds DVT prophylaxis:  prophylactic heparin  GI prophylaxis: PPI Lines: R brachial PICC, R femoral HD catheter Foley:  External urinary catheter Code Status:  DBR Last date of multidisciplinary goals of care discussion [05/27/21]    Critical care time: 31 minutes

## 2021-06-18 NOTE — Progress Notes (Signed)
Patient noted to be asystole on monitor. This RN and April Singleton RN enter room. Family at bedside. No heart sounds or breathe sounds heard for full minute by both RN's. Pupils non-reactive. Patient declared deceased at 3.

## 2021-06-18 NOTE — Progress Notes (Signed)
[facility] NEPHROLOGY PROGRESS NOTE  Assessment/ Plan: Pt is a 88 y.o. yo male with history of hypertension, HLD, hypothyroidism, SIADH, Gilbert's syndrome, urinary incontinence admitted with fall, altered mental status due to septic shock, pneumonia and now AKI and respiratory failure.  #Acute kidney injury likely ATN due to septic shock: CRRT started on 12/7 for hyperkalemia, azotemia and decreasing urine output.  No sign of renal or clinical recovery.  We had a family meeting in the presence of primary team and palliative care on 12/9.  Plan for family to visit today and then change to comfort care/hospice care.  Discontinue CRRT at that time.  #Septic shock due to pneumonia: Currently on Levophed and vasopressin.  Monitor BP.  #Ventilator dependent respiratory failure/influenza pneumonia/MSSA: Currently on vancomycin and cefepime.  #Hyperkalemia: Managing with CRRT fluid.  #Hyperphosphatemia: Expected to improve with CRRT.  #SIADH/hyponatremia: Managing with CRRT, monitor sodium level.  #Anemia: Hemoglobin mildly downtrending.  No sign of active bleeding.  Discussed with ICU team.  Likely comfort/hospice care and discontinuation of CRRT today.  Please call back with question.  Subjective: Seen and examined in ICU.  No new event.  Had to change filter overnight.  No clinical improvement. Objective Vital signs in last 24 hours: Vitals:   06-21-21 0741 2021/06/21 0745 06/21/21 0800 June 21, 2021 0815  BP:      Pulse: (!) 53 (!) 53 (!) 51 (!) 52  Resp: (!) 30 (!) 30 (!) 30 (!) 30  Temp:      TempSrc:      SpO2: 100% 100% 100% 100%  Weight:      Height:       Weight change: 1.4 kg  Intake/Output Summary (Last 24 hours) at 06-21-2021 0947 Last data filed at 06/21/21 0800 Gross per 24 hour  Intake 1341.58 ml  Output 1528 ml  Net -186.42 ml        Labs: Basic Metabolic Panel: Recent Labs  Lab 05/26/21 1722 05/27/21 0340 05/27/21 0914 05/27/21 1516  NA  135 134*  134* 135 135  K 5.9* 4.6  4.6 4.2 4.3  CL 100 99  99  --  100  CO2 21* 21*  21*  --  22  GLUCOSE 131* 144*  143*  --  133*  BUN 65* 51*  55*  --  53*  CREATININE 2.66* 1.99*  2.00*  --  1.77*  CALCIUM 7.5* 7.6*  7.6*  --  7.7*  PHOS 8.6* 7.4*  7.4*  --  6.2*    Liver Function Tests: Recent Labs  Lab 05/26/21 1722 05/27/21 0340 05/27/21 1516  ALBUMIN <1.5* <1.5* <1.5*    No results for input(s): LIPASE, AMYLASE in the last 168 hours. No results for input(s): AMMONIA in the last 168 hours. CBC: Recent Labs  Lab 05/24/21 0329 05/24/21 0956 05/25/21 0418 05/26/21 0539 05/26/21 0822 05/27/21 0340 05/27/21 0914 06/21/21 0317  WBC 18.9*  --  31.0* 33.1*  --  21.8*  --  17.5*  HGB 9.1*   < > 9.8* 10.4*   < > 9.6* 9.9* 8.7*  HCT 27.3*   < > 29.1* 31.4*   < > 29.7* 29.0* 26.3*  MCV 96.1  --  96.0 96.6  --  96.1  --  94.9  PLT 155  --  208 232  --  220  --  183   < > = values in this interval not displayed.    Cardiac Enzymes: No results for input(s): CKTOTAL, CKMB, CKMBINDEX, TROPONINI in  the last 168 hours. CBG: Recent Labs  Lab 05/27/21 1515 05/27/21 1901 05/27/21 2306 06/04/2021 0313 06/04/21 0709  GLUCAP 126* 127* 120* 134* 105*     Iron Studies: No results for input(s): IRON, TIBC, TRANSFERRIN, FERRITIN in the last 72 hours. Studies/Results: DG Chest 1 View  Result Date: 06-04-21 CLINICAL DATA:  Respiratory failure.  Endotracheal tube placement. EXAM: CHEST  1 VIEW COMPARISON:  05/24/2021 FINDINGS: Endotracheal tube has tip approximately 5.5 cm above the carina. Nasogastric tube courses into the region of the stomach and off the film as tip is not visualized. Right-sided PICC line has tip over the SVC. Interval removal of right IJ central venous catheter. Lungs are adequately inflated demonstrate patchy bilateral airspace opacification slight interval improved aeration over the left base and right upper lung. No significant effusion.  Cardiomediastinal silhouette and remainder of the exam is unchanged. IMPRESSION: 1. Persistent patchy bilateral airspace process with slight interval improved aeration over the left base and right upper lung. Likely multifocal infection. 2. Tubes and lines as described. Electronically Signed   By: Marin Olp M.D.   On: 2021/06/04 08:35    Medications: Infusions:  fentaNYL infusion INTRAVENOUS     HYDROmorphone      Scheduled Medications:    have reviewed scheduled and prn medications.  Physical Exam: General: Critically ill looking male, not responding, intubated. Heart:RRR, s1s2 nl Lungs: Coarse breath sound bilateral Abdomen:soft,  non-distended Extremities: Trace dependent edema Dialysis Access: IJ temporary HD catheter placed by CCM on 12/7.  Mohamed Portlock Prasad Evangelyn Crouse 04-Jun-2021,9:47 AM  LOS: 12 days

## 2021-06-18 DEATH — deceased

## 2021-12-15 IMAGING — DX DG CHEST 1V PORT
1 series · 1 of 1 positions shown · non-contrast
Comparison: 05/18/2021

CLINICAL DATA: Hypoxemia, sepsis, worsening shock. Acute
respiratory failure. Encephalopathy.

EXAM:
PORTABLE CHEST 1 VIEW

[chest ap]
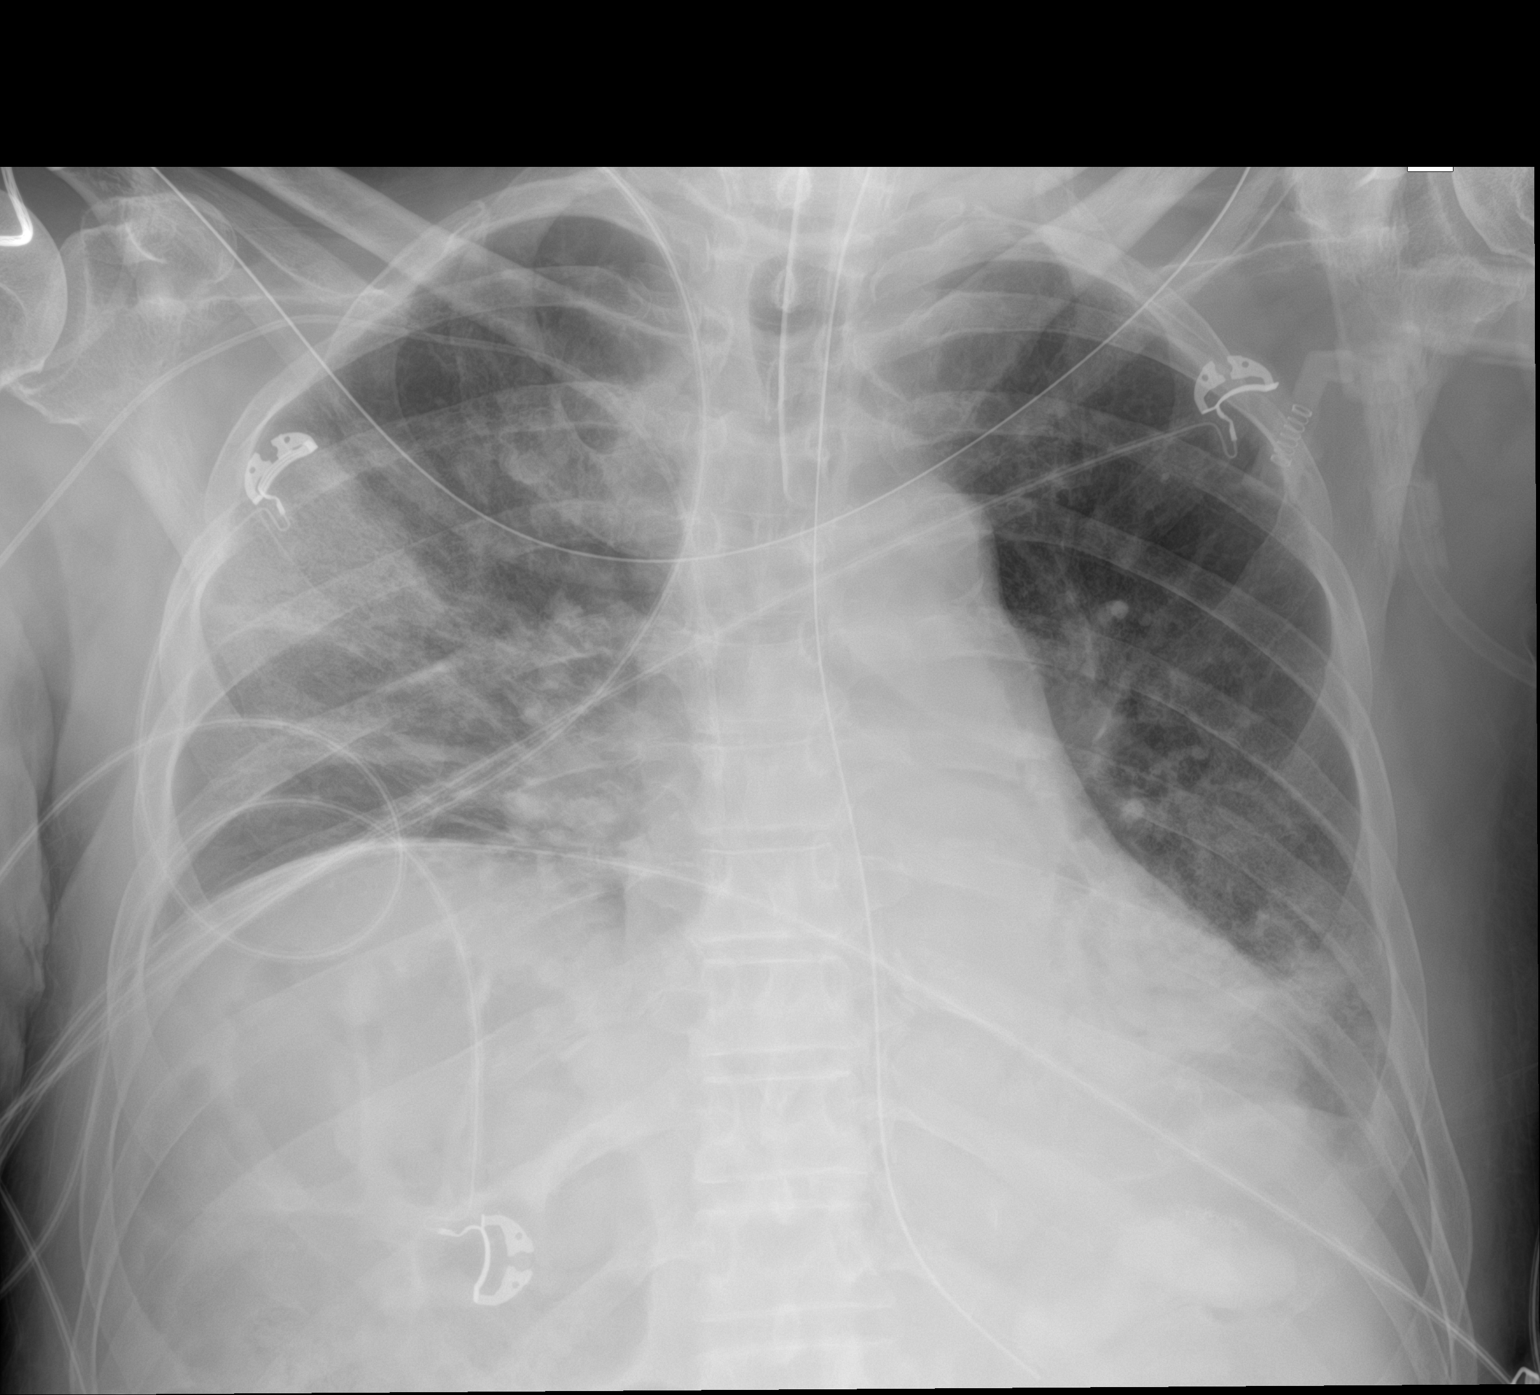

[1 of 1 positions shown; findings below may reference images not displayed]

FINDINGS: The endotracheal tube is 3 cm above the carina, satisfactorily
position. Nasogastric tube enters the stomach. Right PICC line tip:
Upper right atrium.

Low lung volumes are present, causing crowding of the pulmonary
vasculature. Mildly improved aeration at the left lung base although
dense consolidation remains. Slightly worsened airspace opacity in
the right mid lung with increased confluence of hazy opacities.

Heart size within normal limits. Atherosclerotic calcification of
the aortic arch. Borderline elevated right hemidiaphragm.
IMPRESSION: 1. Mildly improved aeration at the left lung base but worsened
aeration in the right mid lung. Appearance favors multifocal
pneumonia or aspiration pneumonitis.
2. Tubes and lines appear satisfactorily position.
3. Low lung volumes.

## 2021-12-18 IMAGING — DX DG CHEST 1V PORT
1 series · 1 of 1 positions shown · non-contrast
Comparison: 05/20/2021

CLINICAL DATA: Respiratory failure.  Ventilator support.

EXAM:
PORTABLE CHEST 1 VIEW

[chest ap]
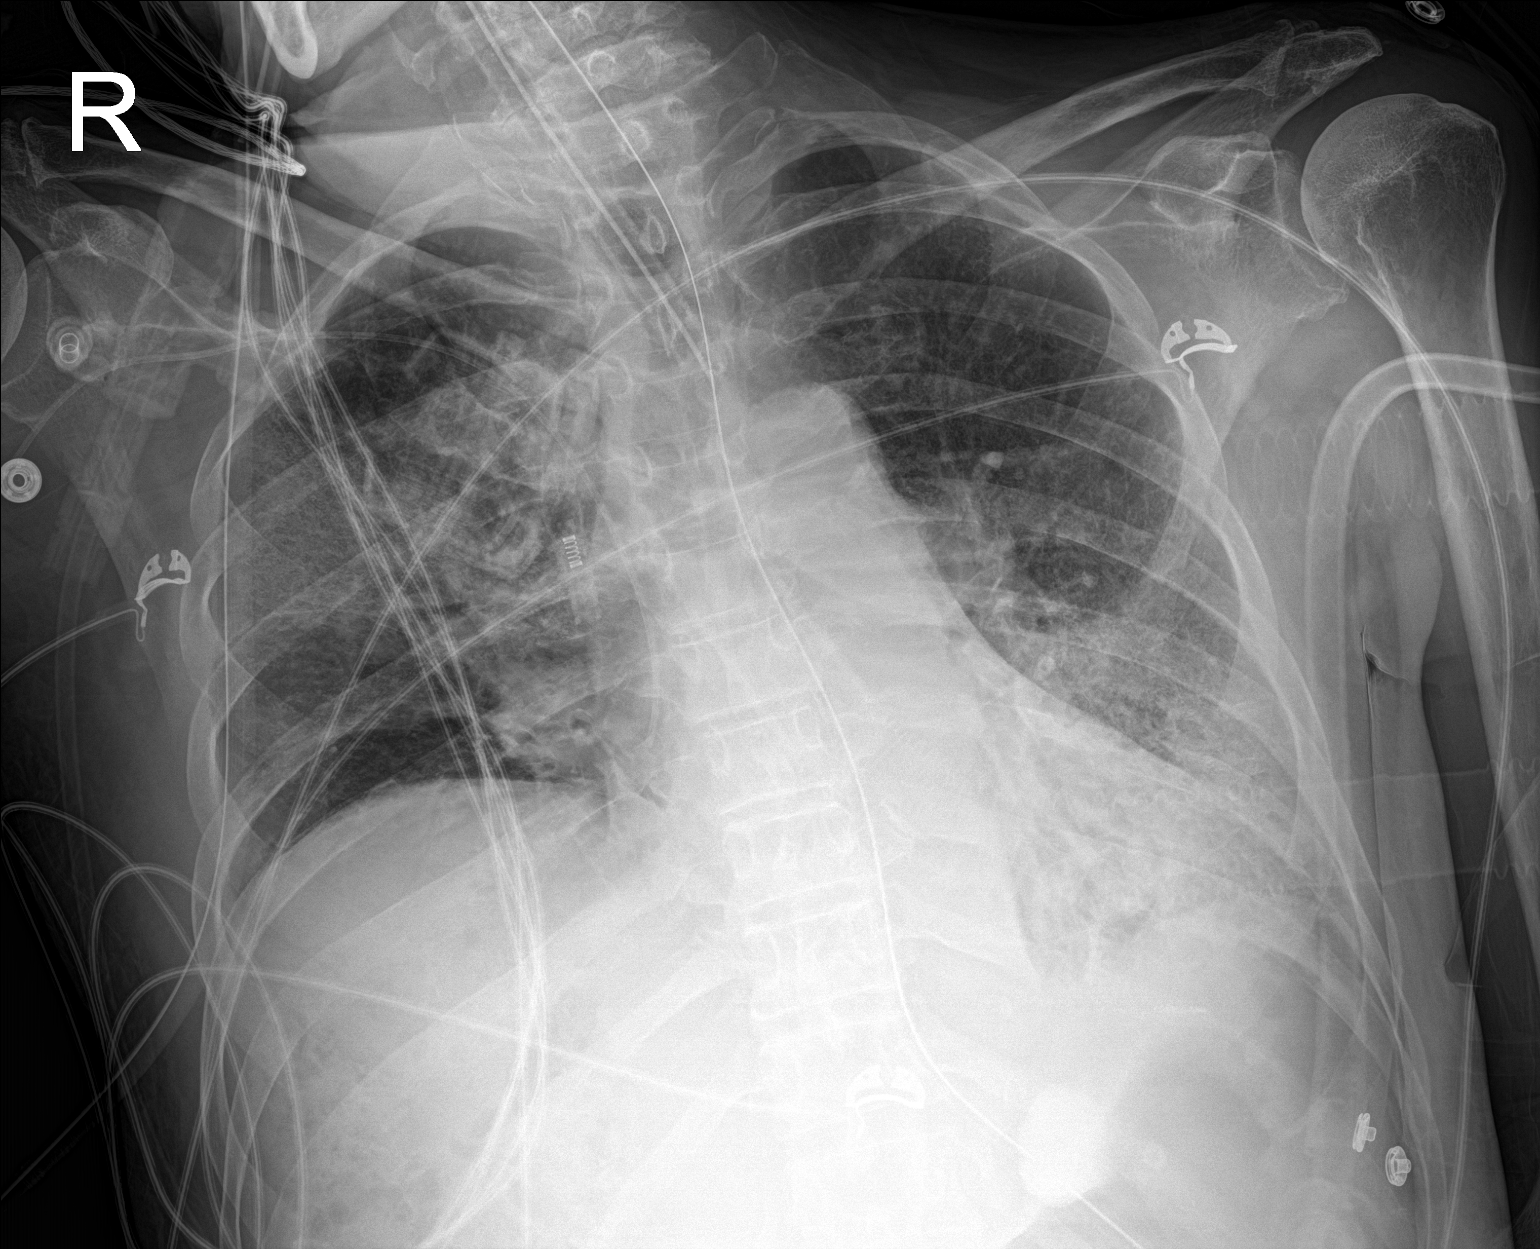

[1 of 1 positions shown; findings below may reference images not displayed]

FINDINGS: Endotracheal tube tip is 5 cm above the carina. Orogastric or
nasogastric tube enters the stomach. Right arm PICC tip is at the
SVC RA junction. Pulmonary infiltrate and volume loss in the right
mid lung and left lower lobe persists but may be becoming less
dense.
IMPRESSION: Lines and tubes satisfactory. Persistent bilateral pulmonary
infiltrates consistent with pneumonia. Question if the infiltrates
are becoming less dense.

## 2021-12-19 IMAGING — US US RENAL
1 series · 14 of 25 positions shown · non-contrast
Comparison: CT chest abdomen and pelvis 05/16/2021.

CLINICAL DATA: Acute kidney injury.

EXAM:
RENAL / URINARY TRACT ULTRASOUND COMPLETE

[Series 1: us renal · 47 acquisitions, 14 frames shown]
[im 1/47]
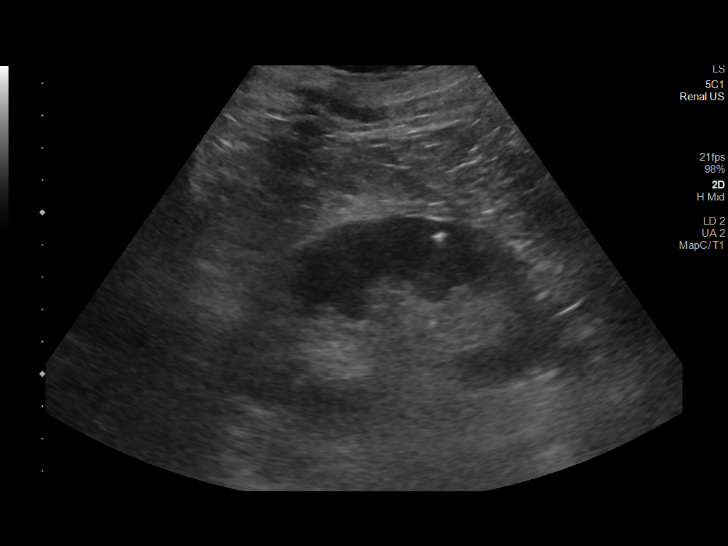
[im 4/47]
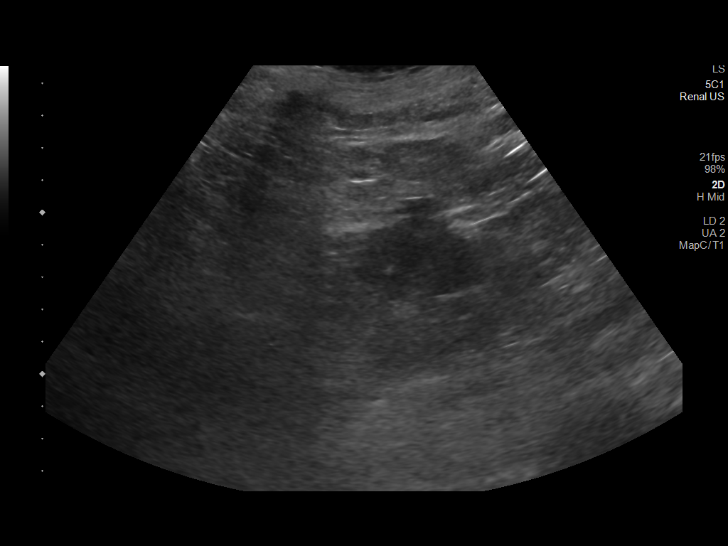
[im 8/47]
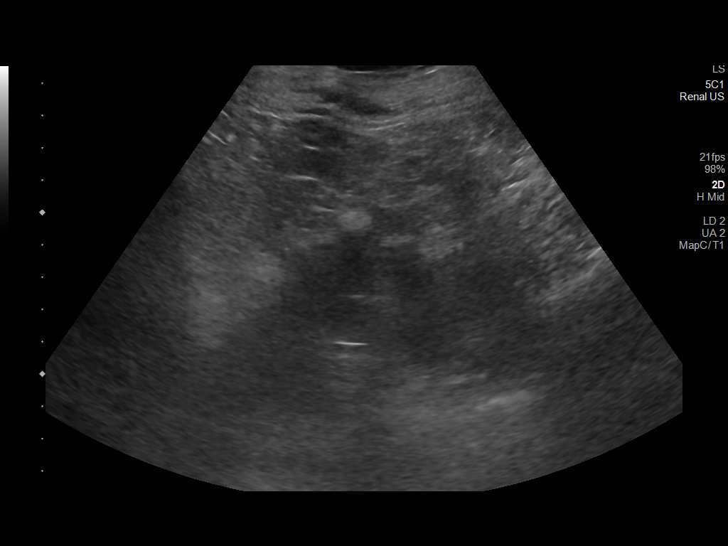
[im 12/47]
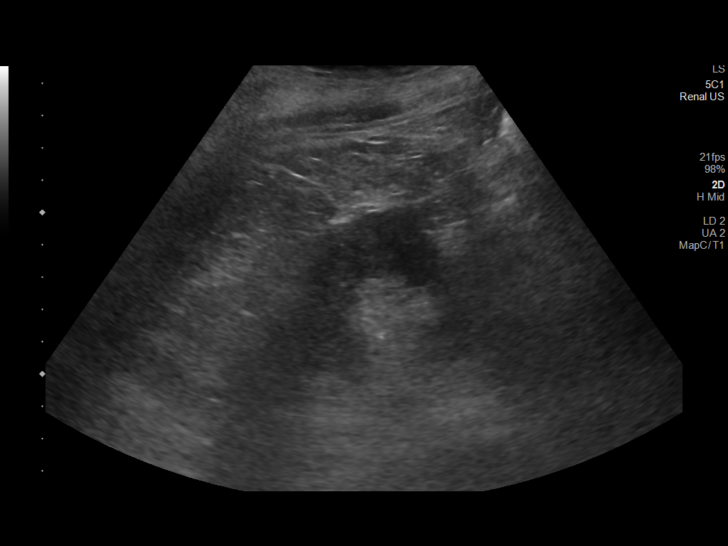
[im 16/47]
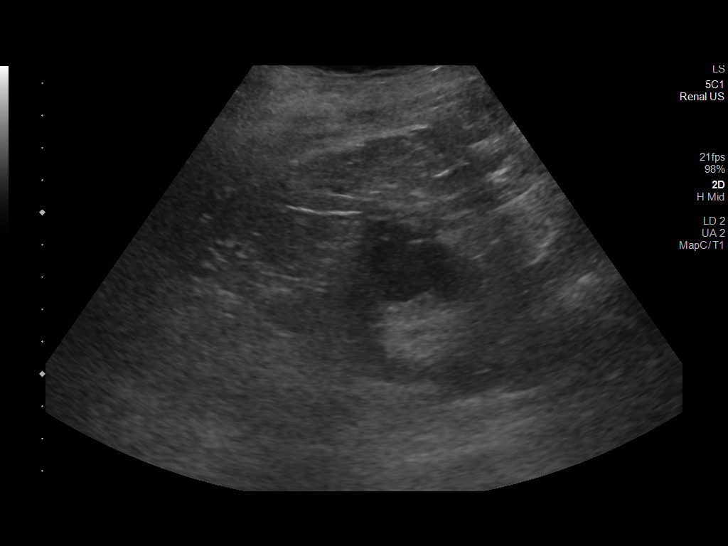
[im 18/47]
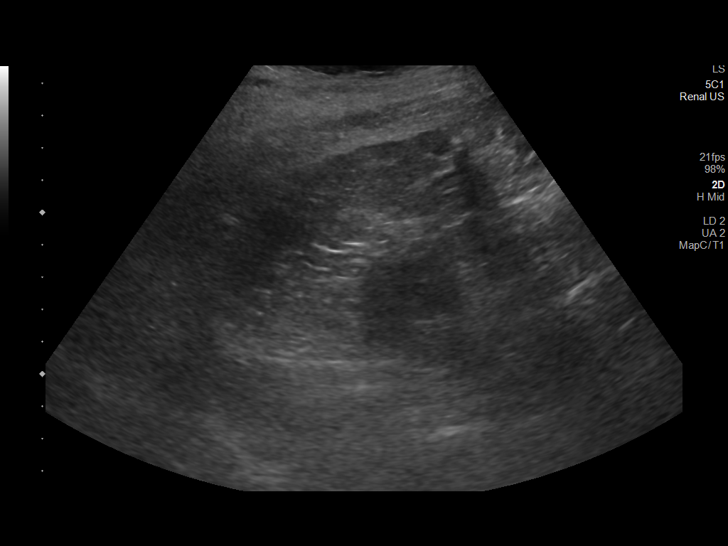
[im 22/47]
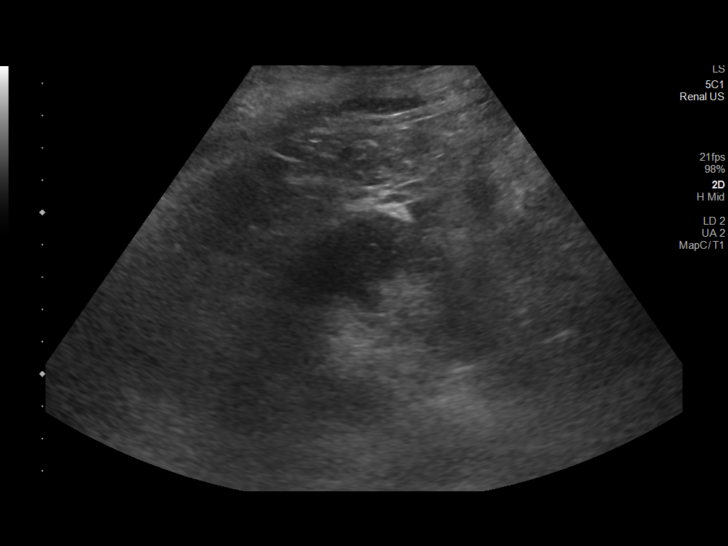
[im 25/47]
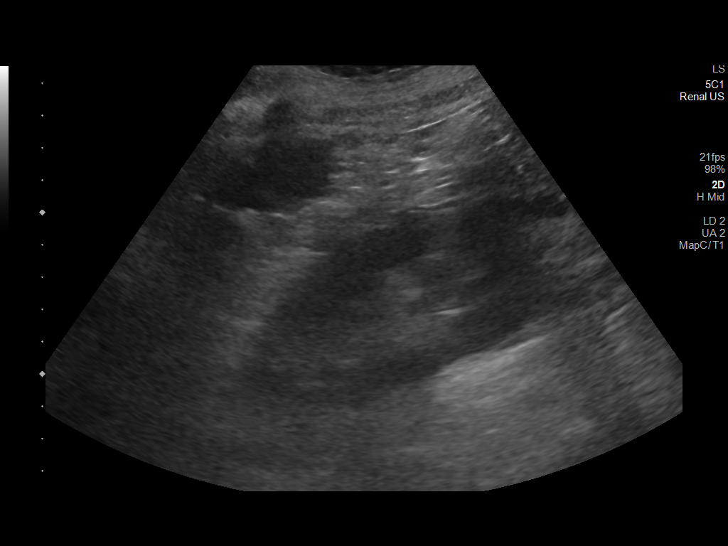
[im 29/47]
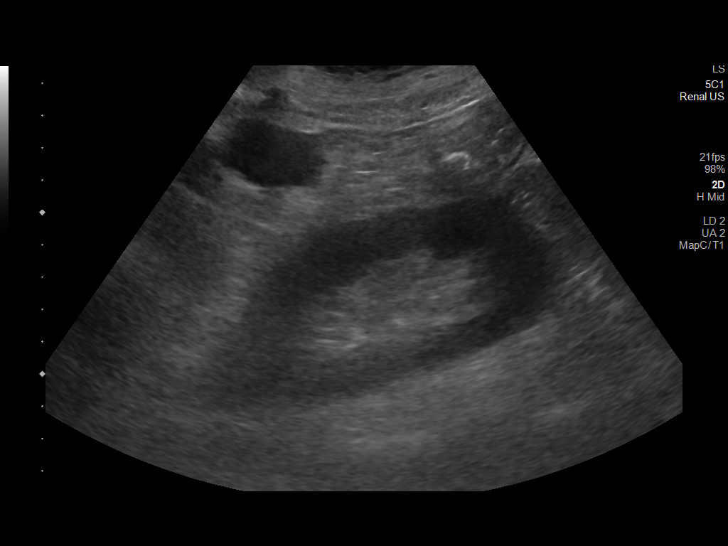
[im 31/47]
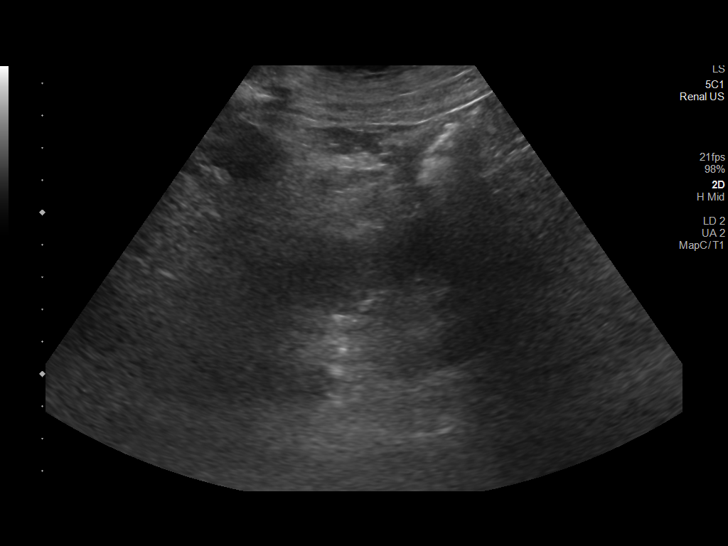
[im 35/47]
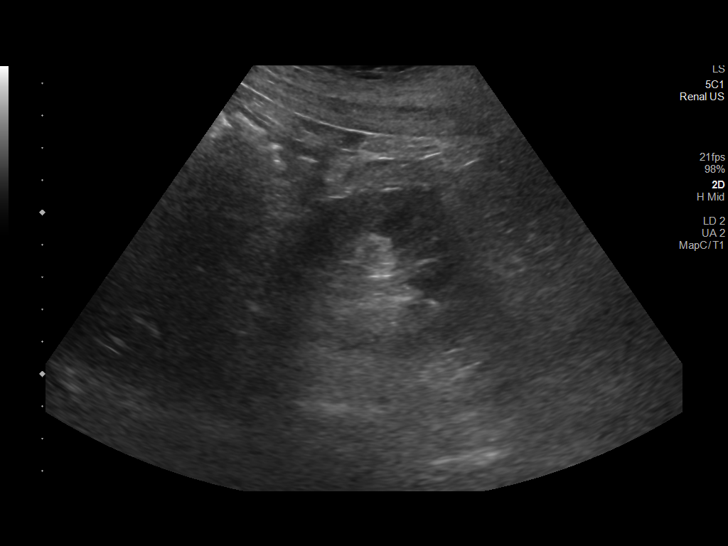
[im 39/47]
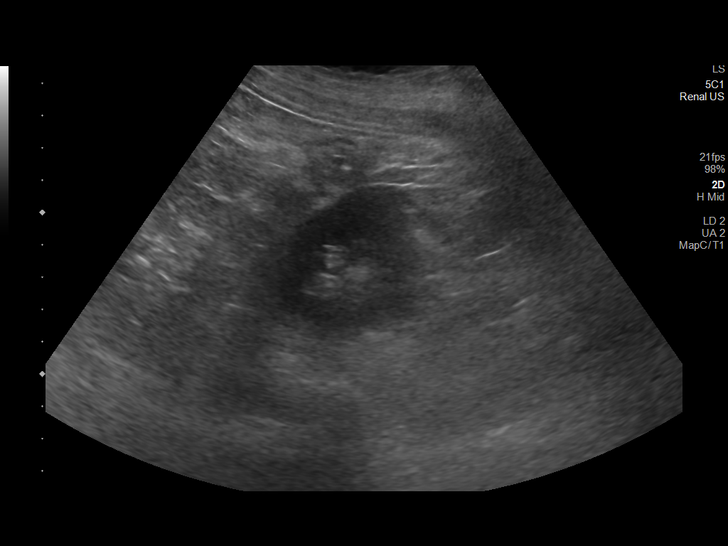
[im 43/47]
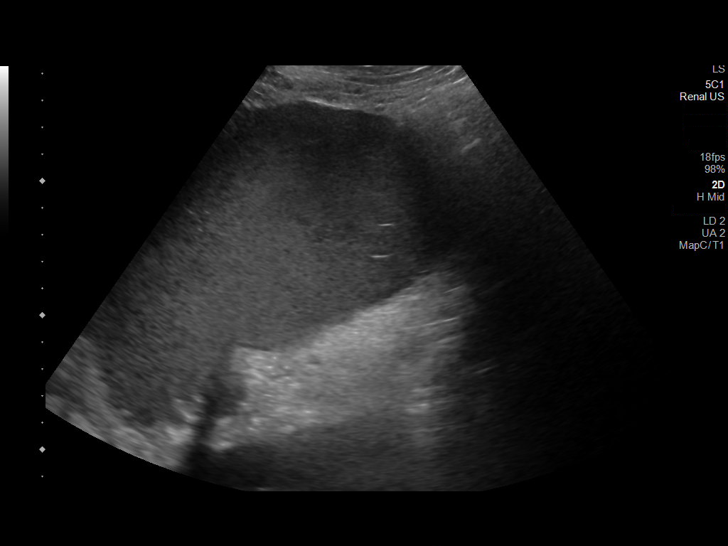
[im 47/47]
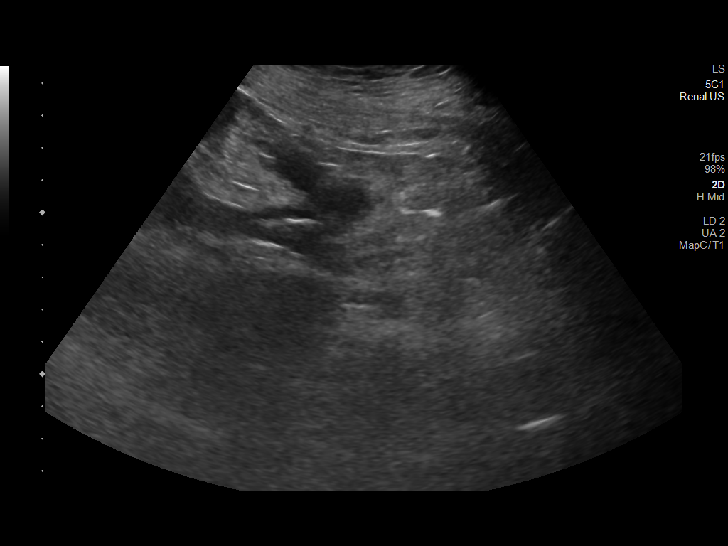

[14 of 25 positions shown; findings below may reference images not displayed]

FINDINGS: Right Kidney:

Renal measurements: 10.3 x 5.4 x 3.9 cm = volume: 113 mL.
Echogenicity within normal limits. No mass or hydronephrosis
visualized.

Left Kidney:

Renal measurements: 10.8 by 5.2 x 4.7 cm = volume: 138 mL.
Echogenicity within normal limits. No mass or hydronephrosis
visualized.

Bladder:

Not seen.

Other:

Sonographer notes left upper quadrant structure containing mobile
debris, possibly dilated stomach.
IMPRESSION: 1. Kidneys appear within normal limits.
2. Bladder not well seen.
3. Indeterminate left upper quadrant structure containing mobile
debris, possibly dilated stomach. Please correlate clinically.

## 2021-12-23 IMAGING — DX DG CHEST 1V
1 series · 1 of 1 positions shown · non-contrast
Comparison: 05/24/2021

CLINICAL DATA: Respiratory failure.  Endotracheal tube placement.

EXAM:
CHEST  1 VIEW

[chest ap]
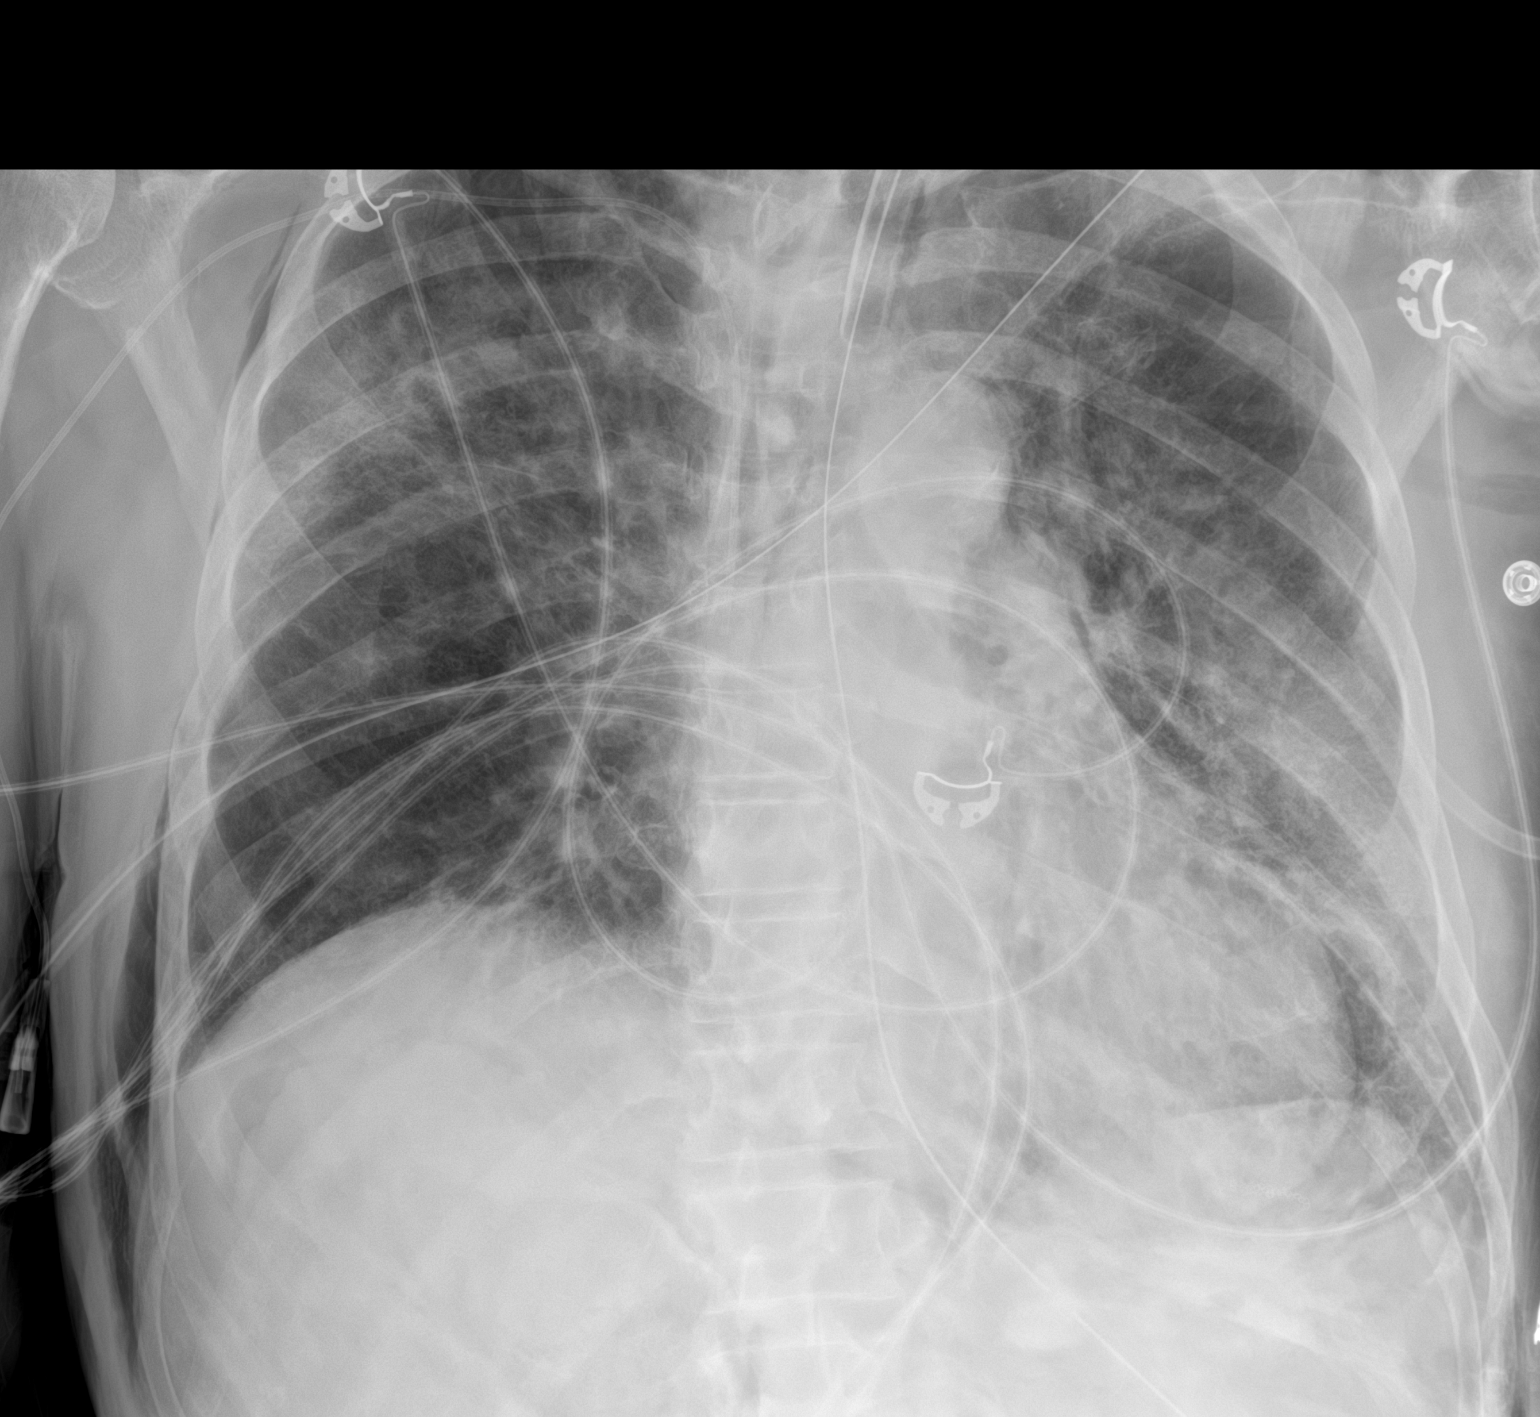

[1 of 1 positions shown; findings below may reference images not displayed]

FINDINGS: Endotracheal tube has tip approximately 5.5 cm above the carina.
Nasogastric tube courses into the region of the stomach and off the
film as tip is not visualized. Right-sided PICC line has tip over
the SVC. Interval removal of right IJ central venous catheter. Lungs
are adequately inflated demonstrate patchy bilateral airspace
opacification slight interval improved aeration over the left base
and right upper lung. No significant effusion. Cardiomediastinal
silhouette and remainder of the exam is unchanged.
IMPRESSION: 1. Persistent patchy bilateral airspace process with slight interval
improved aeration over the left base and right upper lung. Likely
multifocal infection.
2. Tubes and lines as described.
